# Patient Record
Sex: Female | Born: 1940 | Race: White | Hispanic: No | Smoking: Never smoker
Health system: Southern US, Community
[De-identification: ages and names within clinical notes are randomized; demographics above are authoritative.]

## PROBLEM LIST (undated history)

## (undated) DIAGNOSIS — N390 Urinary tract infection, site not specified: Secondary | ICD-10-CM

## (undated) DIAGNOSIS — I10 Essential (primary) hypertension: Secondary | ICD-10-CM

## (undated) DIAGNOSIS — E039 Hypothyroidism, unspecified: Secondary | ICD-10-CM

## (undated) DIAGNOSIS — N189 Chronic kidney disease, unspecified: Secondary | ICD-10-CM

## (undated) DIAGNOSIS — B029 Zoster without complications: Secondary | ICD-10-CM

## (undated) DIAGNOSIS — D509 Iron deficiency anemia, unspecified: Secondary | ICD-10-CM

## (undated) DIAGNOSIS — Z8701 Personal history of pneumonia (recurrent): Secondary | ICD-10-CM

## (undated) DIAGNOSIS — E119 Type 2 diabetes mellitus without complications: Secondary | ICD-10-CM

## (undated) DIAGNOSIS — M1990 Unspecified osteoarthritis, unspecified site: Secondary | ICD-10-CM

## (undated) DIAGNOSIS — E785 Hyperlipidemia, unspecified: Secondary | ICD-10-CM

## (undated) DIAGNOSIS — Z9289 Personal history of other medical treatment: Secondary | ICD-10-CM

## (undated) DIAGNOSIS — Z940 Kidney transplant status: Secondary | ICD-10-CM

## (undated) DIAGNOSIS — I4719 Other supraventricular tachycardia: Secondary | ICD-10-CM

## (undated) DIAGNOSIS — I6529 Occlusion and stenosis of unspecified carotid artery: Secondary | ICD-10-CM

## (undated) DIAGNOSIS — I471 Supraventricular tachycardia: Secondary | ICD-10-CM

## (undated) DIAGNOSIS — K219 Gastro-esophageal reflux disease without esophagitis: Secondary | ICD-10-CM

## (undated) HISTORY — DX: Chronic kidney disease, unspecified: N18.9

## (undated) HISTORY — PX: TUBAL LIGATION: SHX77

## (undated) HISTORY — DX: Supraventricular tachycardia: I47.1

## (undated) HISTORY — PX: KIDNEY SURGERY: SHX687

## (undated) HISTORY — DX: Other supraventricular tachycardia: I47.19

## (undated) HISTORY — DX: Zoster without complications: B02.9

## (undated) HISTORY — PX: TONSILLECTOMY: SUR1361

## (undated) HISTORY — PX: THYROIDECTOMY: SHX17

## (undated) HISTORY — PX: CHOLECYSTECTOMY OPEN: SUR202

## (undated) HISTORY — DX: Essential (primary) hypertension: I10

## (undated) HISTORY — DX: Iron deficiency anemia, unspecified: D50.9

## (undated) HISTORY — PX: BACK SURGERY: SHX140

## (undated) HISTORY — PX: LUMBAR DISC SURGERY: SHX700

## (undated) HISTORY — DX: Personal history of pneumonia (recurrent): Z87.01

## (undated) HISTORY — PX: APPENDECTOMY: SHX54

## (undated) HISTORY — PX: ABDOMINAL HYSTERECTOMY: SHX81

## (undated) HISTORY — DX: Kidney transplant status: Z94.0

---

## 1997-09-09 ENCOUNTER — Inpatient Hospital Stay (HOSPITAL_COMMUNITY): Admission: RE | Admit: 1997-09-09 | Discharge: 1997-09-11 | Payer: Self-pay | Admitting: Neurosurgery

## 1998-07-31 ENCOUNTER — Encounter: Payer: Self-pay | Admitting: Urology

## 1998-07-31 ENCOUNTER — Ambulatory Visit (HOSPITAL_COMMUNITY): Admission: RE | Admit: 1998-07-31 | Discharge: 1998-07-31 | Payer: Self-pay | Admitting: Urology

## 1998-08-03 ENCOUNTER — Ambulatory Visit (HOSPITAL_COMMUNITY): Admission: RE | Admit: 1998-08-03 | Discharge: 1998-08-03 | Payer: Self-pay | Admitting: Urology

## 1998-08-03 ENCOUNTER — Encounter: Payer: Self-pay | Admitting: Urology

## 1999-08-06 ENCOUNTER — Ambulatory Visit (HOSPITAL_COMMUNITY): Admission: RE | Admit: 1999-08-06 | Discharge: 1999-08-06 | Payer: Self-pay | Admitting: Gastroenterology

## 1999-08-06 ENCOUNTER — Encounter: Payer: Self-pay | Admitting: Gastroenterology

## 1999-12-07 ENCOUNTER — Other Ambulatory Visit: Admission: RE | Admit: 1999-12-07 | Discharge: 1999-12-07 | Payer: Self-pay | Admitting: Otolaryngology

## 2000-12-27 ENCOUNTER — Other Ambulatory Visit: Admission: RE | Admit: 2000-12-27 | Discharge: 2000-12-27 | Payer: Self-pay | Admitting: Obstetrics and Gynecology

## 2001-07-11 ENCOUNTER — Ambulatory Visit (HOSPITAL_COMMUNITY): Admission: RE | Admit: 2001-07-11 | Discharge: 2001-07-11 | Payer: Self-pay | Admitting: Pulmonary Disease

## 2001-08-30 ENCOUNTER — Encounter (HOSPITAL_COMMUNITY): Admission: RE | Admit: 2001-08-30 | Discharge: 2001-11-28 | Payer: Self-pay | Admitting: Nephrology

## 2001-10-31 ENCOUNTER — Ambulatory Visit (HOSPITAL_COMMUNITY): Admission: RE | Admit: 2001-10-31 | Discharge: 2001-10-31 | Payer: Self-pay | Admitting: Cardiology

## 2001-11-01 ENCOUNTER — Encounter: Payer: Self-pay | Admitting: Cardiology

## 2001-12-06 ENCOUNTER — Encounter (HOSPITAL_COMMUNITY): Admission: RE | Admit: 2001-12-06 | Discharge: 2002-03-06 | Payer: Self-pay | Admitting: Nephrology

## 2002-03-08 ENCOUNTER — Encounter (HOSPITAL_COMMUNITY): Admission: RE | Admit: 2002-03-08 | Discharge: 2002-06-06 | Payer: Self-pay | Admitting: Nephrology

## 2002-04-16 ENCOUNTER — Encounter: Payer: Self-pay | Admitting: Nephrology

## 2002-04-16 ENCOUNTER — Ambulatory Visit (HOSPITAL_COMMUNITY): Admission: RE | Admit: 2002-04-16 | Discharge: 2002-04-16 | Payer: Self-pay | Admitting: Nephrology

## 2002-05-11 ENCOUNTER — Emergency Department (HOSPITAL_COMMUNITY): Admission: EM | Admit: 2002-05-11 | Discharge: 2002-05-12 | Payer: Self-pay | Admitting: Emergency Medicine

## 2002-05-12 ENCOUNTER — Encounter: Payer: Self-pay | Admitting: *Deleted

## 2002-05-16 HISTORY — PX: KIDNEY TRANSPLANT: SHX239

## 2002-11-26 ENCOUNTER — Ambulatory Visit (HOSPITAL_COMMUNITY): Admission: RE | Admit: 2002-11-26 | Discharge: 2002-11-26 | Payer: Self-pay | Admitting: Nephrology

## 2002-11-26 ENCOUNTER — Encounter: Payer: Self-pay | Admitting: Nephrology

## 2003-03-04 ENCOUNTER — Encounter (HOSPITAL_COMMUNITY): Admission: RE | Admit: 2003-03-04 | Discharge: 2003-06-02 | Payer: Self-pay | Admitting: Nephrology

## 2003-05-20 ENCOUNTER — Ambulatory Visit (HOSPITAL_COMMUNITY): Admission: RE | Admit: 2003-05-20 | Discharge: 2003-05-20 | Payer: Self-pay | Admitting: Pulmonary Disease

## 2003-06-13 ENCOUNTER — Encounter (HOSPITAL_COMMUNITY): Admission: RE | Admit: 2003-06-13 | Discharge: 2003-09-11 | Payer: Self-pay | Admitting: Nephrology

## 2003-07-28 ENCOUNTER — Ambulatory Visit (HOSPITAL_COMMUNITY): Admission: RE | Admit: 2003-07-28 | Discharge: 2003-07-28 | Payer: Self-pay | Admitting: Pulmonary Disease

## 2003-08-27 ENCOUNTER — Ambulatory Visit (HOSPITAL_COMMUNITY): Admission: RE | Admit: 2003-08-27 | Discharge: 2003-08-27 | Payer: Self-pay | Admitting: Pulmonary Disease

## 2003-09-19 ENCOUNTER — Encounter (HOSPITAL_COMMUNITY): Admission: RE | Admit: 2003-09-19 | Discharge: 2003-12-18 | Payer: Self-pay | Admitting: Nephrology

## 2004-01-07 ENCOUNTER — Encounter (HOSPITAL_COMMUNITY): Admission: RE | Admit: 2004-01-07 | Discharge: 2004-04-06 | Payer: Self-pay | Admitting: Nephrology

## 2004-02-18 ENCOUNTER — Ambulatory Visit (HOSPITAL_COMMUNITY): Admission: RE | Admit: 2004-02-18 | Discharge: 2004-02-18 | Payer: Self-pay

## 2004-03-30 ENCOUNTER — Ambulatory Visit: Payer: Self-pay | Admitting: *Deleted

## 2004-04-13 ENCOUNTER — Encounter (HOSPITAL_COMMUNITY): Admission: RE | Admit: 2004-04-13 | Discharge: 2004-07-12 | Payer: Self-pay | Admitting: Nephrology

## 2004-04-21 ENCOUNTER — Ambulatory Visit: Payer: Self-pay | Admitting: Orthopedic Surgery

## 2004-04-28 ENCOUNTER — Encounter (HOSPITAL_COMMUNITY): Admission: RE | Admit: 2004-04-28 | Discharge: 2004-05-14 | Payer: Self-pay | Admitting: Orthopedic Surgery

## 2004-04-29 ENCOUNTER — Ambulatory Visit (HOSPITAL_COMMUNITY): Admission: RE | Admit: 2004-04-29 | Discharge: 2004-04-29 | Payer: Self-pay | Admitting: Pulmonary Disease

## 2004-07-15 ENCOUNTER — Encounter (HOSPITAL_COMMUNITY): Admission: RE | Admit: 2004-07-15 | Discharge: 2004-10-13 | Payer: Self-pay | Admitting: Nephrology

## 2004-11-03 ENCOUNTER — Encounter (HOSPITAL_COMMUNITY): Admission: RE | Admit: 2004-11-03 | Discharge: 2005-02-01 | Payer: Self-pay | Admitting: Nephrology

## 2004-11-09 ENCOUNTER — Ambulatory Visit (HOSPITAL_COMMUNITY): Admission: RE | Admit: 2004-11-09 | Discharge: 2004-11-09 | Payer: Self-pay | Admitting: Nephrology

## 2004-11-24 ENCOUNTER — Ambulatory Visit (HOSPITAL_COMMUNITY): Admission: RE | Admit: 2004-11-24 | Discharge: 2004-11-24 | Payer: Self-pay | Admitting: Pulmonary Disease

## 2005-02-15 ENCOUNTER — Encounter (HOSPITAL_COMMUNITY): Admission: RE | Admit: 2005-02-15 | Discharge: 2005-05-16 | Payer: Self-pay | Admitting: Nephrology

## 2005-07-05 ENCOUNTER — Encounter (HOSPITAL_COMMUNITY): Admission: RE | Admit: 2005-07-05 | Discharge: 2005-10-03 | Payer: Self-pay | Admitting: Nephrology

## 2005-07-08 ENCOUNTER — Emergency Department (HOSPITAL_COMMUNITY): Admission: EM | Admit: 2005-07-08 | Discharge: 2005-07-08 | Payer: Self-pay | Admitting: Emergency Medicine

## 2005-11-24 ENCOUNTER — Encounter (HOSPITAL_COMMUNITY): Admission: RE | Admit: 2005-11-24 | Discharge: 2005-12-24 | Payer: Self-pay | Admitting: Pulmonary Disease

## 2005-11-24 ENCOUNTER — Ambulatory Visit (HOSPITAL_COMMUNITY): Payer: Self-pay | Admitting: Pulmonary Disease

## 2005-12-06 ENCOUNTER — Inpatient Hospital Stay (HOSPITAL_COMMUNITY): Admission: EM | Admit: 2005-12-06 | Discharge: 2005-12-09 | Payer: Self-pay | Admitting: Emergency Medicine

## 2005-12-07 ENCOUNTER — Ambulatory Visit: Payer: Self-pay | Admitting: Gastroenterology

## 2006-07-20 ENCOUNTER — Ambulatory Visit (HOSPITAL_COMMUNITY): Admission: RE | Admit: 2006-07-20 | Discharge: 2006-07-20 | Payer: Self-pay | Admitting: Nephrology

## 2006-08-11 ENCOUNTER — Ambulatory Visit (HOSPITAL_COMMUNITY): Admission: RE | Admit: 2006-08-11 | Discharge: 2006-08-11 | Payer: Self-pay | Admitting: Nephrology

## 2006-09-25 ENCOUNTER — Ambulatory Visit (HOSPITAL_COMMUNITY): Payer: Self-pay | Admitting: Pulmonary Disease

## 2006-09-25 ENCOUNTER — Encounter (HOSPITAL_COMMUNITY): Admission: RE | Admit: 2006-09-25 | Discharge: 2006-10-25 | Payer: Self-pay | Admitting: Pulmonary Disease

## 2006-10-23 ENCOUNTER — Ambulatory Visit (HOSPITAL_COMMUNITY): Payer: Self-pay | Admitting: Nephrology

## 2006-10-23 ENCOUNTER — Encounter (HOSPITAL_COMMUNITY): Admission: RE | Admit: 2006-10-23 | Discharge: 2006-11-22 | Payer: Self-pay | Admitting: Nephrology

## 2006-10-27 ENCOUNTER — Encounter (HOSPITAL_COMMUNITY): Admission: RE | Admit: 2006-10-27 | Discharge: 2006-12-25 | Payer: Self-pay | Admitting: Nephrology

## 2006-11-23 ENCOUNTER — Ambulatory Visit (HOSPITAL_COMMUNITY): Payer: Self-pay | Admitting: Pulmonary Disease

## 2006-11-23 ENCOUNTER — Encounter (HOSPITAL_COMMUNITY): Admission: RE | Admit: 2006-11-23 | Discharge: 2006-12-23 | Payer: Self-pay | Admitting: Oncology

## 2006-12-21 ENCOUNTER — Ambulatory Visit (HOSPITAL_COMMUNITY): Payer: Self-pay | Admitting: Nephrology

## 2007-01-04 ENCOUNTER — Encounter (HOSPITAL_COMMUNITY): Admission: RE | Admit: 2007-01-04 | Discharge: 2007-02-03 | Payer: Self-pay | Admitting: Oncology

## 2007-02-16 ENCOUNTER — Encounter (HOSPITAL_COMMUNITY): Admission: RE | Admit: 2007-02-16 | Discharge: 2007-03-18 | Payer: Self-pay | Admitting: Oncology

## 2007-02-16 ENCOUNTER — Ambulatory Visit (HOSPITAL_COMMUNITY): Payer: Self-pay | Admitting: Nephrology

## 2007-09-07 ENCOUNTER — Ambulatory Visit (HOSPITAL_COMMUNITY): Admission: RE | Admit: 2007-09-07 | Discharge: 2007-09-07 | Payer: Self-pay | Admitting: Pulmonary Disease

## 2007-09-11 ENCOUNTER — Ambulatory Visit (HOSPITAL_COMMUNITY): Admission: RE | Admit: 2007-09-11 | Discharge: 2007-09-11 | Payer: Self-pay | Admitting: Pulmonary Disease

## 2007-09-11 ENCOUNTER — Encounter (INDEPENDENT_AMBULATORY_CARE_PROVIDER_SITE_OTHER): Payer: Self-pay | Admitting: *Deleted

## 2007-10-05 ENCOUNTER — Ambulatory Visit: Payer: Self-pay | Admitting: Internal Medicine

## 2007-10-24 ENCOUNTER — Encounter (INDEPENDENT_AMBULATORY_CARE_PROVIDER_SITE_OTHER): Payer: Self-pay | Admitting: *Deleted

## 2007-10-24 ENCOUNTER — Ambulatory Visit (HOSPITAL_COMMUNITY): Admission: RE | Admit: 2007-10-24 | Discharge: 2007-10-24 | Payer: Self-pay | Admitting: Internal Medicine

## 2007-10-24 ENCOUNTER — Ambulatory Visit: Payer: Self-pay | Admitting: Internal Medicine

## 2007-11-05 ENCOUNTER — Encounter (HOSPITAL_COMMUNITY): Admission: RE | Admit: 2007-11-05 | Discharge: 2007-12-05 | Payer: Self-pay | Admitting: Internal Medicine

## 2007-11-05 ENCOUNTER — Encounter (INDEPENDENT_AMBULATORY_CARE_PROVIDER_SITE_OTHER): Payer: Self-pay | Admitting: *Deleted

## 2008-03-26 ENCOUNTER — Emergency Department (HOSPITAL_COMMUNITY): Admission: EM | Admit: 2008-03-26 | Discharge: 2008-03-26 | Payer: Self-pay | Admitting: Emergency Medicine

## 2008-09-26 ENCOUNTER — Ambulatory Visit: Payer: Self-pay | Admitting: Cardiology

## 2008-10-02 ENCOUNTER — Encounter (HOSPITAL_COMMUNITY): Admission: RE | Admit: 2008-10-02 | Discharge: 2008-11-01 | Payer: Self-pay | Admitting: Cardiology

## 2008-10-02 ENCOUNTER — Ambulatory Visit: Payer: Self-pay | Admitting: Cardiology

## 2008-10-28 DIAGNOSIS — E785 Hyperlipidemia, unspecified: Secondary | ICD-10-CM

## 2008-10-29 ENCOUNTER — Ambulatory Visit: Payer: Self-pay | Admitting: Cardiology

## 2008-11-26 ENCOUNTER — Ambulatory Visit (HOSPITAL_COMMUNITY): Admission: RE | Admit: 2008-11-26 | Discharge: 2008-11-26 | Payer: Self-pay | Admitting: Pulmonary Disease

## 2009-03-20 ENCOUNTER — Ambulatory Visit (HOSPITAL_COMMUNITY): Admission: RE | Admit: 2009-03-20 | Discharge: 2009-03-20 | Payer: Self-pay | Admitting: Pulmonary Disease

## 2009-04-10 ENCOUNTER — Ambulatory Visit (HOSPITAL_COMMUNITY): Admission: RE | Admit: 2009-04-10 | Discharge: 2009-04-10 | Payer: Self-pay | Admitting: Pulmonary Disease

## 2009-05-03 ENCOUNTER — Inpatient Hospital Stay (HOSPITAL_COMMUNITY): Admission: EM | Admit: 2009-05-03 | Discharge: 2009-05-07 | Payer: Self-pay | Admitting: Emergency Medicine

## 2009-05-03 ENCOUNTER — Encounter (INDEPENDENT_AMBULATORY_CARE_PROVIDER_SITE_OTHER): Payer: Self-pay | Admitting: *Deleted

## 2009-05-03 ENCOUNTER — Ambulatory Visit: Payer: Self-pay | Admitting: Cardiology

## 2009-05-03 LAB — CONVERTED CEMR LAB
BUN: 37 mg/dL
CO2: 18 meq/L
CO2: 18 meq/L
Calcium: 9 mg/dL
Chloride: 111 meq/L
Chloride: 111 meq/L
Creatinine, Ser: 1.89 mg/dL
Creatinine, Ser: 1.89 mg/dL
GFR calc non Af Amer: 27 mL/min
Glomerular Filtration Rate, Af Am: 32 mL/min/{1.73_m2}
Glucose, Bld: 187 mg/dL
Glucose, Bld: 187 mg/dL
Potassium: 4.4 meq/L
Sodium: 139 meq/L

## 2009-05-06 ENCOUNTER — Encounter (INDEPENDENT_AMBULATORY_CARE_PROVIDER_SITE_OTHER): Payer: Self-pay | Admitting: *Deleted

## 2009-05-06 ENCOUNTER — Encounter: Payer: Self-pay | Admitting: Cardiology

## 2009-05-06 DIAGNOSIS — D649 Anemia, unspecified: Secondary | ICD-10-CM

## 2009-05-06 DIAGNOSIS — I10 Essential (primary) hypertension: Secondary | ICD-10-CM | POA: Insufficient documentation

## 2009-05-22 ENCOUNTER — Encounter (INDEPENDENT_AMBULATORY_CARE_PROVIDER_SITE_OTHER): Payer: Self-pay | Admitting: *Deleted

## 2009-05-25 ENCOUNTER — Ambulatory Visit: Payer: Self-pay | Admitting: Cardiology

## 2009-05-25 DIAGNOSIS — I4891 Unspecified atrial fibrillation: Secondary | ICD-10-CM

## 2009-08-12 ENCOUNTER — Encounter: Admission: RE | Admit: 2009-08-12 | Discharge: 2009-08-12 | Payer: Self-pay | Admitting: Nephrology

## 2009-11-16 ENCOUNTER — Emergency Department (HOSPITAL_COMMUNITY): Admission: EM | Admit: 2009-11-16 | Discharge: 2009-11-16 | Payer: Self-pay | Admitting: Emergency Medicine

## 2009-11-19 ENCOUNTER — Encounter (INDEPENDENT_AMBULATORY_CARE_PROVIDER_SITE_OTHER): Payer: Self-pay | Admitting: *Deleted

## 2009-11-19 LAB — CONVERTED CEMR LAB
Alkaline Phosphatase: 72 units/L
Basophils Absolute: 0 10*3/uL
CO2: 20 meq/L
Creatinine, Ser: 1.81 mg/dL
Eosinophils Absolute: 0.2 10*3/uL
Eosinophils Relative: 4 %
Glucose, Bld: 150 mg/dL
Iron: 67 ug/dL
Lymphocytes Relative: 21 %
MCHC: 31.3 g/dL
MCV: 89.4 fL
RBC: 3.76 M/uL
RDW: 15 %

## 2009-12-22 ENCOUNTER — Encounter (INDEPENDENT_AMBULATORY_CARE_PROVIDER_SITE_OTHER): Payer: Self-pay | Admitting: *Deleted

## 2010-02-12 ENCOUNTER — Encounter: Admission: RE | Admit: 2010-02-12 | Discharge: 2010-02-12 | Payer: Self-pay | Admitting: Nephrology

## 2010-06-15 NOTE — Assessment & Plan Note (Signed)
Summary: post hosp [name] per pt phone call/tg   History of Present Illness: Debar comes in today for followup. She has some postoperative atrial fibrillation on recent admission with a urinary tract infection.  Since discharge he had no recurrent symptoms. It was felt she did not need Coumadin. Her only predisposing factor with hypertension.  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 colonoscopy Tonsillectomy Tubal Ligation  Family History: Last updated: 2008-11-17 Father:deceased age 13 due to secondary coronary disease mi Mother:living age 55 has diabetes mellitus  Social History: Last updated: 2008-11-17 Retired from [employer]er, [employer] Married  Tobacco Use - No.  Alcohol Use - no Regular Exercise - no Drug Use - no patient has 1 sister alive and well 2 deceased brothers 1 due to alcoholic cirrhosis  Risk Factors: Exercise: no (November 17, 2008)  Risk Factors: Smoking Status: never (17-Nov-2008)  Review of Systems       negative other than history of present illness   Problems:  Medical Problems Added: 1)  Dx of Atrial Fibrillation  (ICD-427.31)  Impression & Recommendations:  Problem # 1:  HYPERTENSION (ICD-401.9) Assessment Unchanged  Her updated medication list for this problem includes:    Metoprolol Tartrate 50 Mg Tabs (Metoprolol tartrate) .Marland Kitchen... Take 1 and 1/2 tabs two times a day    Clonidine Hcl 0.2 Mg Tabs (Clonidine hcl) .Marland Kitchen... Take 1/2 tablet once daily    Micardis 80 Mg Tabs (Telmisartan) .Marland Kitchen... Take 1 tab daily    Aspir-low 81 Mg Tbec (Aspirin) .Marland Kitchen... Take 1 tab daily  Problem # 2:  ATRIAL FIBRILLATION  (ICD-427.31) Assessment: Improved  Her updated medication list for this problem includes:    Metoprolol Tartrate 50 Mg Tabs (Metoprolol tartrate) .Marland Kitchen... Take 1 and 1/2 tabs two times a day    Aspir-low 81 Mg Tbec (Aspirin) .Marland Kitchen... Take 1 tab daily

## 2010-06-15 NOTE — Miscellaneous (Signed)
Summary: labs hosp 05/03/2009  Clinical Lists Changes  Observations: Added new observation of GFR AA: 32 mL/min/1.56m2 (05/03/2009 12:11) Added new observation of GFR: 27 mL/min (05/03/2009 12:11) Added new observation of CREATININE: 1.89 mg/dL (16/02/9603 54:09) Added new observation of BUN: 37 mg/dL (81/19/1478 29:56) Added new observation of BG RANDOM: 187 mg/dL (21/30/8657 84:69) Added new observation of CO2 PLSM/SER: 18 meq/L (05/03/2009 12:11) Added new observation of CL SERUM: 111 meq/L (05/03/2009 12:11) Added new observation of K SERUM: 4.4 meq/L (05/03/2009 12:11) Added new observation of NA: 139 meq/L (05/03/2009 12:11)

## 2010-06-15 NOTE — Assessment & Plan Note (Signed)
Summary: post hosp [name] per pt phone call/tg   Visit Type:  Follow-up Primary Provider:  Dr.Edward Juanetta Gosling   History of Present Illness: [name] returns today for followup of her paroxysmal atrial fibrillation.  This happened while she was in the hospital with a urinary tract infection. Risk factors are hypertension.  She has not had any further events that she is aware of. It was felt she did not need Coumadin at this time.  She denies any palpitations, chest discomfort, dyspnea on exertion, PND or peripheral edema.  Current Medications (verified): 1)  Cellcept 500 Mg Tabs (Mycophenolate Mofetil) .... Take 1 Tab Two Times A Day 2)  Prograf 5 Mg Caps (Tacrolimus) .... Take 2 Every Other Day 3)  Diflucan 200 Mg Tabs (Fluconazole) .... Take 1/2 Tab Daily 4)  Metoprolol Tartrate 50 Mg Tabs (Metoprolol Tartrate) .... Take 1 and 1/2 Tabs Two Times A Day 5)  Clonidine Hcl 0.2 Mg Tabs (Clonidine Hcl) .... Take 1/2 Tablet Once Daily 6)  Micardis 80 Mg Tabs (Telmisartan) .... Take 1 Tab Daily 7)  Zetia 10 Mg Tabs (Ezetimibe) .... Take 1 Tab Daily 8)  Aspir-Low 81 Mg Tbec (Aspirin) .... Take 1 Tab Daily 9)  Clonazepam 0.5 Mg Tabs (Clonazepam) .... Take 1 Tab At Bedtime 10)  Nexium 40 Mg Cpdr (Esomeprazole Magnesium) .... Take 1 Tab Two Times A Day 11)  Dilaudid 2 Mg/ml Soln (Hydromorphone Hcl) .... Take 1 Tab At Bedtime  Allergies (verified): No Known Drug Allergies  Review of Systems       negative other than history of present illness  Vital Signs:  Patient profile:   70 year old female Height:      63 inches Weight:      131 pounds BMI:     23.29 Pulse rate:   61 / minute BP sitting:   114 / 66  (right arm)  Vitals Entered By: Deanna Saa, CNA (May 25, 2009 12:56 PM)  Physical Exam  General:  Well developed, well nourished, in no acute distress. Head:  normocephalic and atraumatic Eyes:  PERRLA/EOM intact; conjunctiva and lids normal. Mouth:  Teeth, gums  and palate normal. Oral mucosa normal. Neck:  Neck supple, no JVD. No masses, thyromegaly or abnormal cervical nodes. Chest Joua Bake:  no deformities or breast masses noted Lungs:  Clear bilaterally to auscultation and percussion. Heart:  Non-displaced PMI, chest non-tender; regular rate and rhythm, S1, S2 without murmurs, rubs or gallops. Carotid upstroke normal, no bruit. Normal abdominal aortic size, no bruits. Femorals normal pulses, no bruits. Pedals normal pulses. No edema, no varicosities. Msk:  Back normal, normal gait. Muscle strength and tone normal. Pulses:  pulses normal in all 4 extremities Extremities:  No clubbing or cyanosis. Neurologic:  Alert and oriented x 3. Skin:  Intact without lesions or rashes. Psych:  Normal affect.   Problems:  Medical Problems Added: 1)  Dx of Atrial Fibrillation  (ICD-427.31)  EKG  Procedure date:  05/25/2009  Findings:      sinus bradycardia, low voltage, left atrial enlargement  Impression & Recommendations:  Problem # 1:  ATRIAL FIBRILLATION (ICD-427.31) Assessment Improved  Her updated medication list for this problem includes:    Metoprolol Tartrate 50 Mg Tabs (Metoprolol tartrate) .Marland Kitchen... Take 1 and 1/2 tabs two times a day    Aspir-low 81 Mg Tbec (Aspirin) .Marland Kitchen... Take 1 tab daily  Problem # 2:  HYPERTENSION (ICD-401.9) Assessment: Unchanged  Her updated medication list for this problem includes:  Metoprolol Tartrate 50 Mg Tabs (Metoprolol tartrate) .Marland Kitchen... Take 1 and 1/2 tabs two times a day    Clonidine Hcl 0.2 Mg Tabs (Clonidine hcl) .Marland Kitchen... Take 1/2 tablet once daily    Micardis 80 Mg Tabs (Telmisartan) .Marland Kitchen... Take 1 tab daily    Aspir-low 81 Mg Tbec (Aspirin) .Marland Kitchen... Take 1 tab daily  Patient Instructions: 1)  Your physician recommends that you schedule a follow-up appointment in: 6 months 2)  Your physician has recommended you make the following change in your medication: DECREASE CLONIDINE TO 1/2 TABLET BY MOUTH DAILY

## 2010-06-15 NOTE — Miscellaneous (Signed)
Summary: LABS IRON,IBC,CBCD,CMP 11/19/2009  Clinical Lists Changes  Observations: Added new observation of MAGNESIUM: 1.8 mg/dL (16/02/9603 54:09) Added new observation of CALCIUM: 8.9 mg/dL (81/19/1478 29:56) Added new observation of ALBUMIN: 4.3 g/dL (21/30/8657 84:69) Added new observation of PROTEIN, TOT: 6.6 g/dL (62/95/2841 32:44) Added new observation of SGPT (ALT): 13 units/L (11/19/2009 16:00) Added new observation of SGOT (AST): 12 units/L (11/19/2009 16:00) Added new observation of ALK PHOS: 72 units/L (11/19/2009 16:00) Added new observation of CREATININE: 1.81 mg/dL (05/18/7251 66:44) Added new observation of BUN: 30 mg/dL (03/47/4259 56:38) Added new observation of BG RANDOM: 150 mg/dL (75/64/3329 51:88) Added new observation of CO2 PLSM/SER: 20 meq/L (11/19/2009 16:00) Added new observation of CL SERUM: 108 meq/L (11/19/2009 16:00) Added new observation of K SERUM: 4.6 meq/L (11/19/2009 16:00) Added new observation of NA: 142 meq/L (11/19/2009 16:00) Added new observation of IRON SATUR %: 30 % (11/19/2009 16:00) Added new observation of TIBC: 160 mcg/dL (41/66/0630 16:01) Added new observation of UIBC: 227 mcg/dL (09/32/3557 32:20) Added new observation of IRON: 67 mcg/dL (25/42/7062 37:62) Added new observation of ABSOLUTE BAS: 0.0 K/uL (11/19/2009 16:00) Added new observation of BASOPHIL %: 1 % (11/19/2009 16:00) Added new observation of EOS ABSLT: 0.2 K/uL (11/19/2009 16:00) Added new observation of % EOS AUTO: 4 % (11/19/2009 16:00) Added new observation of ABSOLUTE MON: 0.5 K/uL (11/19/2009 16:00) Added new observation of MONOCYTE %: 10 % (11/19/2009 16:00) Added new observation of ABS LYMPHOCY: 1.0 K/uL (11/19/2009 16:00) Added new observation of LYMPHS %: 21 % (11/19/2009 16:00) Added new observation of PLATELETK/UL: 158 K/uL (11/19/2009 16:00) Added new observation of RDW: 15.0 % (11/19/2009 16:00) Added new observation of MCHC RBC: 31.3 g/dL (83/15/1761  60:73) Added new observation of MCV: 89.4 fL (11/19/2009 16:00) Added new observation of HCT: 33.6 % (11/19/2009 16:00) Added new observation of HGB: 10.5 g/dL (71/10/2692 85:46) Added new observation of RBC M/UL: 3.76 M/uL (11/19/2009 16:00) Added new observation of WBC COUNT: 4.8 10*3/microliter (11/19/2009 16:00)

## 2010-06-15 NOTE — Miscellaneous (Signed)
Summary: labs bmet,tsh 05/03/2009  Clinical Lists Changes  Observations: Added new observation of CALCIUM: 9.0 mg/dL (40/34/7425 95:63) Added new observation of GFR AA: 32 mL/min/1.2m2 (05/03/2009 12:07) Added new observation of GFR: 27 mL/min (05/03/2009 12:07) Added new observation of CREATININE: 1.89 mg/dL (87/56/4332 95:18) Added new observation of BUN: 37 mg/dL (84/16/6063 01:60) Added new observation of BG RANDOM: 187 mg/dL (10/93/2355 73:22) Added new observation of CO2 PLSM/SER: 18 meq/L (05/03/2009 12:07) Added new observation of CL SERUM: 111 meq/L (05/03/2009 12:07) Added new observation of K SERUM: 4.4 meq/L (05/03/2009 12:07) Added new observation of NA: 139 meq/L (05/03/2009 12:07)

## 2010-07-02 ENCOUNTER — Other Ambulatory Visit (HOSPITAL_COMMUNITY): Payer: Self-pay | Admitting: *Deleted

## 2010-07-02 ENCOUNTER — Ambulatory Visit (HOSPITAL_COMMUNITY)
Admission: RE | Admit: 2010-07-02 | Discharge: 2010-07-02 | Disposition: A | Discharge status: Home / Self Care | Payer: Medicare Other | Source: Ambulatory Visit | Attending: Diagnostic Radiology | Admitting: Diagnostic Radiology

## 2010-07-02 DIAGNOSIS — J189 Pneumonia, unspecified organism: Secondary | ICD-10-CM

## 2010-07-02 DIAGNOSIS — J988 Other specified respiratory disorders: Secondary | ICD-10-CM | POA: Insufficient documentation

## 2010-07-02 DIAGNOSIS — J984 Other disorders of lung: Secondary | ICD-10-CM | POA: Insufficient documentation

## 2010-07-02 DIAGNOSIS — Z940 Kidney transplant status: Secondary | ICD-10-CM | POA: Insufficient documentation

## 2010-07-08 ENCOUNTER — Other Ambulatory Visit (HOSPITAL_COMMUNITY): Payer: Self-pay | Admitting: Nephrology

## 2010-07-08 DIAGNOSIS — N390 Urinary tract infection, site not specified: Secondary | ICD-10-CM

## 2010-07-08 DIAGNOSIS — T8610 Unspecified complication of kidney transplant: Secondary | ICD-10-CM

## 2010-07-12 ENCOUNTER — Ambulatory Visit (HOSPITAL_COMMUNITY): Payer: Medicare Other

## 2010-07-20 ENCOUNTER — Encounter (HOSPITAL_COMMUNITY): Payer: Self-pay

## 2010-07-20 ENCOUNTER — Ambulatory Visit (HOSPITAL_COMMUNITY)
Admission: RE | Admit: 2010-07-20 | Discharge: 2010-07-20 | Disposition: A | Discharge status: Home / Self Care | Payer: Medicare Other | Source: Ambulatory Visit | Attending: Nephrology | Admitting: Nephrology

## 2010-07-20 DIAGNOSIS — T8610 Unspecified complication of kidney transplant: Secondary | ICD-10-CM

## 2010-07-20 DIAGNOSIS — N390 Urinary tract infection, site not specified: Secondary | ICD-10-CM | POA: Insufficient documentation

## 2010-07-20 DIAGNOSIS — Z9079 Acquired absence of other genital organ(s): Secondary | ICD-10-CM | POA: Insufficient documentation

## 2010-07-20 DIAGNOSIS — Q618 Other cystic kidney diseases: Secondary | ICD-10-CM | POA: Insufficient documentation

## 2010-07-20 DIAGNOSIS — Z940 Kidney transplant status: Secondary | ICD-10-CM | POA: Insufficient documentation

## 2010-07-20 DIAGNOSIS — K7689 Other specified diseases of liver: Secondary | ICD-10-CM | POA: Insufficient documentation

## 2010-07-20 DIAGNOSIS — K5730 Diverticulosis of large intestine without perforation or abscess without bleeding: Secondary | ICD-10-CM | POA: Insufficient documentation

## 2010-08-01 LAB — URINALYSIS, ROUTINE W REFLEX MICROSCOPIC
Glucose, UA: NEGATIVE mg/dL
Ketones, ur: NEGATIVE mg/dL
Protein, ur: NEGATIVE mg/dL

## 2010-08-01 LAB — URINE CULTURE: Colony Count: >=100000

## 2010-08-01 LAB — URINE MICROSCOPIC-ADD ON

## 2010-08-16 LAB — GLUCOSE, CAPILLARY
Glucose-Capillary: 138 mg/dL — ABNORMAL HIGH (ref 70–99)
Glucose-Capillary: 159 mg/dL — ABNORMAL HIGH (ref 70–99)
Glucose-Capillary: 167 mg/dL — ABNORMAL HIGH (ref 70–99)
Glucose-Capillary: 168 mg/dL — ABNORMAL HIGH (ref 70–99)
Glucose-Capillary: 185 mg/dL — ABNORMAL HIGH (ref 70–99)
Glucose-Capillary: 210 mg/dL — ABNORMAL HIGH (ref 70–99)
Glucose-Capillary: 220 mg/dL — ABNORMAL HIGH (ref 70–99)
Glucose-Capillary: 229 mg/dL — ABNORMAL HIGH (ref 70–99)

## 2010-08-16 LAB — DIFFERENTIAL
Basophils Absolute: 0.1 K/uL (ref 0.0–0.1)
Basophils Relative: 1 % (ref 0–1)
Eosinophils Absolute: 0.2 K/uL (ref 0.0–0.7)
Eosinophils Relative: 2 % (ref 0–5)
Lymphocytes Relative: 6 % — ABNORMAL LOW (ref 12–46)
Lymphs Abs: 0.4 K/uL — ABNORMAL LOW (ref 0.7–4.0)
Monocytes Absolute: 0.4 K/uL (ref 0.1–1.0)
Monocytes Relative: 5 % (ref 3–12)
Neutro Abs: 6.9 K/uL (ref 1.7–7.7)
Neutrophils Relative %: 87 % — ABNORMAL HIGH (ref 43–77)

## 2010-08-16 LAB — CULTURE, BLOOD (ROUTINE X 2)
Culture: NO GROWTH
Report Status: 12242010

## 2010-08-16 LAB — BASIC METABOLIC PANEL
BUN: 37 mg/dL — ABNORMAL HIGH (ref 6–23)
CO2: 18 mEq/L — ABNORMAL LOW (ref 19–32)
Calcium: 9 mg/dL (ref 8.4–10.5)
Chloride: 111 mEq/L (ref 96–112)
Creatinine, Ser: 1.89 mg/dL — ABNORMAL HIGH (ref 0.4–1.2)
Glucose, Bld: 187 mg/dL — ABNORMAL HIGH (ref 70–99)

## 2010-08-16 LAB — URINALYSIS, ROUTINE W REFLEX MICROSCOPIC
Bilirubin Urine: NEGATIVE
Glucose, UA: NEGATIVE mg/dL
Ketones, ur: NEGATIVE mg/dL
Nitrite: POSITIVE — AB
Protein, ur: 30 mg/dL — AB
Specific Gravity, Urine: 1.015 (ref 1.005–1.030)
Urobilinogen, UA: 0.2 mg/dL (ref 0.0–1.0)
pH: 5 (ref 5.0–8.0)

## 2010-08-16 LAB — CBC
MCHC: 34.5 g/dL (ref 30.0–36.0)
MCV: 88.5 fL (ref 78.0–100.0)
RDW: 14.6 % (ref 11.5–15.5)

## 2010-08-16 LAB — URINE MICROSCOPIC-ADD ON

## 2010-08-16 LAB — URINE CULTURE: Colony Count: >=100000

## 2010-08-16 LAB — CARDIAC PANEL(CRET KIN+CKTOT+MB+TROPI)
CK, MB: 0.7 ng/mL (ref 0.3–4.0)
Relative Index: INVALID (ref 0.0–2.5)
Relative Index: INVALID (ref 0.0–2.5)
Total CK: 38 U/L (ref 7–177)
Troponin I: 0.02 ng/mL (ref 0.00–0.06)

## 2010-09-28 NOTE — H&P (Signed)
NAME:  [name], [name]               ACCOUNT NO.:  [id number]   MEDICAL RECORD NO.:  [id number]          PATIENT TYPE:  AMB   LOCATION:  DAY                           FACILITY:  [facility]   PHYSICIAN:  R. Roetta Sessions, M.D. DATE OF BIRTH:  August 07, 1940   DATE OF ADMISSION:  DATE OF DISCHARGE:  LH                              HISTORY & PHYSICAL   CHIEF COMPLAINT:  Early satiety, abdominal bloating, chronic nausea and  refractory GERD.   HISTORY OF PRESENT ILLNESS:  [name] is a 70 year old Caucasian  female.  She has a history of chronic GERD.  Over the last 2 months, she  has had chronic nausea.  She tells me it is worse within an hour or two  after eating.  She does eat small meals and does have early satiety.  She complains of a significant amount of abdominal bloating after eating  as well.  Her weight is up 19 pounds in the last 6 months.  She did have  some epigastric and left upper quadrant abdominal pain after eating as  well as heartburn and indigestion, but recently her Nexium was changed  from 40 mg b.i.d. to Capadex 60 mg daily.  She says this has helped with  the epigastric pain but she continues to have the bloating and nausea.  She is having soft brown bowel movements once or twice per day.  Denies  any rectal bleeding or melena.   PAST MEDICAL AND SURGICAL HISTORY:  1. 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  She has had multiple stenting procedures      over the course of 30 years to help preserve her kidney function.      She is followed by Dr. Aundria Rud at the Transplant Clinic at [facility].  2. 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  Partial      thyroidectomy for goiter on thyroid replacement, hypertension,      hyperlipidemia, iron deficiency anemia, history of previous      Aranesp, which was recently discontinued.  3. Status post cholecystectomy for acalculous  cholecystitis in 1986.      Partial hysterectomy with unilateral oophorectomy, tonsillectomy,      back surgery.  She had a colonoscopy 5 years ago by [facility] in      [place] which reportedly was normal (we will request records).   CURRENT MEDICATIONS:  1. CellCept 500 mg b.i.d.  2. Prograf 0.5 mg daily.  3. Diflucan 100 mg daily.  4. Metoprolol 75 mg b.i.d.  5. Clonidine 0.1 one to two mg daily.  6. Micardis 80 mg daily.  7. Capadex 60 mg daily.  8. Zetia 10 mg daily.  9. Aspirin 81 mg daily.  10.Levoxyl 75 mcg daily.   ALLERGIES:  MORPHINE, CODEINE, IV MAGNESIUM and IV DYE.   She did have an abdominal ultrasound on September 11, 2007.  It showed  atrophic native kidneys with stable minimally complicated cyst to the  right kidney which was 3.2 cm  she had unremarkable transplant kidney in  the right iliac fossa.  She had a CBD of 7 mm, status post  cholecystectomy.  Echogenic fatty liver, otherwise normal exam.  She had  a chest x-ray which showed minimal bronchitic changes, stable nodular  foci bilateral upper lobes.   FAMILY HISTORY:  There is no known family history of carcinoma or  chronic GI problems.  Mother is age 75 has diabetes mellitus.  Father  deceased at 6 secondary coronary disease and MI.  She has 1 sister  alive and has lost 2 brothers, one due to alcoholic cirrhosis.   SOCIAL HISTORY:  [name] is married.  She has 5 healthy children.  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  She denies any tobacco, alcohol or drug  use.   REVIEW OF SYSTEMS:  See HPI.  GU: She denies any dysuria, hematuria,  increased urinary frequency.  Otherwise negative review of systems.   PHYSICAL EXAM:  VITAL SIGNS: Weight 129 pounds, height 63 inches,  temperature 98.0, blood pressure 120/80, pulse 76.  GENERAL:  [name] is a well-developed, well-nourished Caucasian  female in no acute distress.  who is awake and alert, oriented, pleasant, and cooperative, in no acute  distress.   HEENT:  Pupils equal, round, and reactive to light.  Sclerae clear,  nonicteric.  Conjunctivae pink.  Oropharynx pink and moist without  lesions.  NECK:  Supple without evidence of mass or thyromegaly.  CHEST:  Heart regular rate and rhythm.  Normal S1, S2 without murmurs,  clicks, rubs, or gallops.  LUNGS:  Clear to auscultation bilaterally.  ABDOMEN:  Positive bowel sounds x4.  No bruits auscultated.  Soft.  Nontender, nondistended without palpable mass or hepatosplenomegaly.  No  rebound tenderness or guarding.  EXTREMITIES:  Without clubbing or edema bilaterally.  SKIN:  Warm and dry without any rash or jaundice.   IMPRESSION:  [name] is a 70 year old female with a 82-month history  of chronic nausea along with refractory heartburn and indigestion,  abdominal bloating, and postprandial epigastric and left upper quadrant  pain.  Some of her symptoms have responded to a change in PPI.  However,  her postprandial nausea and bloating persist.  She is status post  cholecystectomy as well as renal transplant.  She is noted to have fatty  liver on ultrasound as well.  I did feel we need to rule out peptic  ulcer disease as well as gastritis or poorly controlled gastroesophageal  reflux disease as culprit of her symptoms.  Interestingly, she has  continued to gain weight, but she believes that her thyroid status has  been checked recently at [facility].  Will request all records.  She does  not appear to have pancreatitis or small-bowel obstruction.   PLAN:  1. Will request recent laboratory studies from [facility], Dr. Aundria Rud.  2. EGD with Dr. Jena Gauss in the near future.  Discuss procedure as risks      and benefits, which include but not limited to bleeding, infection,      perforation, drug reaction, she agrees plans obtained.  3. Continue Capadex 60 mg daily.  4. Further recommendations pending EGD.  Thank you Dr. Juanetta Gosling for      letting us  participate in the care of [name].      Lorenza Burton, N.P.      Jonathon Bellows, M.D.  Electronically Signed    KJ/MEDQ  D:  10/05/2007  T:  10/05/2007  Job:  [id number]   cc:   Ramon Dredge L. Juanetta Gosling, M.D.  Fax: [phone]   Dyke Maes, M.D.  Fax: [phone]

## 2010-09-28 NOTE — Op Note (Signed)
NAME:  [name], MELECIO               ACCOUNT NO.:  [id number]   MEDICAL RECORD NO.:  [id number]          PATIENT TYPE:  AMB   LOCATION:  DAY                           FACILITY:  [facility]   PHYSICIAN:  R. Roetta Sessions, M.D. DATE OF BIRTH:  Oct 02, 1940   DATE OF PROCEDURE:  DATE OF DISCHARGE:                               OPERATIVE REPORT   INDICATIONS FOR PROCEDURE:  A 23-month history of chronic nausea,  heartburn, indigestion, abdominal bloating, and postprandial epigastric  left upper quadrant abdominal pain.  She has derived significant  improvement with a course of Kapidex 60 mg orally daily for the past 1  month, but continues to have regurgitation, nausea, and early satiety.  She is not having any dysphagia or odynophagia.  EGD is now being done.  The risks, benefits, alternatives, and limitations have been reviewed.  Questions answered.  Please see documentation in the medical record.   PROCEDURE NOTE:  O2 saturation, blood pressure, pulse rate, and  respirations were monitored throughout the entire procedure.   CONSCIOUS SEDATION:  Versed 4 mg IV and Demerol 75 mg IV in divided  doses.   ANESTHESIA:  Cetacaine spray for topical pharyngeal anesthesia.   INSTRUMENT:  Pentax video endoscopy system.   FINDINGS:  Examination of the tubular esophagus revealed a couple of  tiny distal esophageal erosions, otherwise esophageal mucosa appeared  normal, and the tubular esophagus was patent.  EG junction easily  traversed.  Stomach:  The gastric cavity had a large amount of retained  food, which precluded a complete examination of the stomach.  I was able  to see the antrum well.  This portion of the stomach appeared normal as  did the pylorus, which was patent and easily traversed.  Examination of  the bulb, second portion revealed no abnormalities.  The retroflexion of  the proximal stomach, esophagogastric junction, there was a small hiatal  hernia, but again a good portion of the  stomach was obscured by food  debris.  The patient tolerated the procedure well.   IMPRESSION:  1. A couple of tiny distal esophageal erosions consistent with mild      erosive reflux esophagitis.  2. A large amount of retained food debris in the stomach precluded      complete examination of the gastric mucosa, normal-appearing      antrum, patent pylorus, normal D1 and D2.   I suspect, the patient has gastroparesis.  (It is notable that her last  intake of solid food occurred 14 hours prior to this procedure).   RECOMMENDATIONS:  Continue Kapidex 60 mg orally daily.  For the time  being, she is to divide up her meals into 4 or 5 smaller meals daily  rather than 3 main meals.  We will proceed with a solid-phase gastric-  emptying study to quantify the degree of gastroparesis.  Further  recommendations to follow in the very near future.      Jonathon Bellows, M.D.  Electronically Signed     RMR/MEDQ  D:  10/24/2007  T:  10/24/2007  Job:  [id number]   cc:  Dyke Maes, M.D.  Fax: [phone]   Oneal Deputy. Juanetta Gosling, M.D.  Fax: [phone]   Dr. Aundria Rud

## 2010-09-28 NOTE — Assessment & Plan Note (Signed)
[facility]                       [place] [facility] OFFICE NOTE   ELLIANNE, [name] Maddox                      MRN:          [id number]  DATE:09/26/2008                            DOB:          03/31/1941    CHIEF COMPLAINT:  Shortness of breath.   HISTORY OF PRESENT ILLNESS:  [name] Maddox is a delightful 71 year old  white female, who has been having shortness of breath and some  substernal atypical chest pain over the last several months.   It occurs sometimes when she is really upset emotionally with her  husband.  Her husband is a patient of mine, who has had several strokes  and has become very difficult to manage at home.  He takes out a lot on  her.   She also has some shortness breath with exertion.  She is unable to do  much for herself including walk or exercise.   She has no conventional risk factors except for age and hypertension.  She does not have hyperlipidemia and type 2 diabetes and does not smoke.  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  She had a transplant 2004, and is doing very well with that.   PAST MEDICAL HISTORY:  She is intolerant of MORPHINE and CODEINE.  She  has intolerance to IV DYE.   She has had previous tonsillectomy, tubal ligation, hysterectomy,  cholecystectomy, lower back surgery, and partial thyroidectomy.  Apparently, she has had some sort of stent to her kidney in 1973, but I  suspect this was not vascular nor that she had a urinary track drainage  abnormality.   SOCIAL HISTORY:  She is married.  She has 5 children.  She was born and  raised in [place].  She does not smoke or drink.  She does not use  any illicit drugs.  She is retired.   FAMILY HISTORY:  There is a history of heart disease in her father, who  died in his 51s.   REVIEW OF SYSTEMS:  She wears glasses.  She has had a history of  gastroesophageal reflux.  She has arthritis in both knees.   Otherwise,  all review of systems are negative.  Please refer to our diagnostic  evaluation form.   PHYSICAL EXAMINATION:  VITAL SIGNS:  She is 5 feet and 3, weighs 130  pounds.  Her blood pressure is 120/80 in the right arm, her pulse is 70  and regular.  HEENT:  Normal.  Dentition is normal.  Oral mucosa is normal.  NECK:  Carotid upstrokes are equal bilaterally without bruits.  There is  no JVD.  Thyroid is not enlarged.  There is a thyroidectomy scar.  Trachea is midline.  CHEST/LUNGS:  Clear to auscultation and percussion.  HEART:  Nondisplaced PMI, normal S1 and S2.  No murmur or gallop.  ABDOMEN:  Soft, good bowel sounds.  No midline bruit, no thyromegaly.  EXTREMITIES:  No cyanosis, clubbing, or edema.  Pulses are intact.  Good  dorsalis pedis and posterior tibial.  No sign of DVT.  NEUROLOGIC:  Intact.  SKIN:  Unremarkable.  Her EKG is essentially normal except for possible left atrial  enlargement.   ASSESSMENT:  1. Shortness of breath and dyspnea on exertion.  Much of this may be      related to stress.  However, she has several risk factors for      coronary artery disease.  We need to rule out obstructive coronary      artery disease.  2. Other problems as listed above.   PLAN:  Exercise rest stress Myoview.  We will try to reproduce her  symptoms on the treadmill.  If this is negative for ischemia,  reassurance will be given.   I think a lot of this is related to domestic stress with her husband  being ill as mentioned above.  I will have her return in several weeks  with him, who I know well and one of his children to discuss being a  little bit easy on her.     Thomas C. Daleen Squibb, MD, [facility]  Electronically Signed    TCW/MedQ  DD: 09/26/2008  DT: 09/27/2008  Job #: [id number]   cc:   Dyke Maes, M.D.

## 2010-09-28 NOTE — Assessment & Plan Note (Signed)
[facility]                       [place] CARDIOLOGY OFFICE NOTE   Deanna, Maddox                      MRN:          [id number]  DATE:10/29/2008                            DOB:          08/17/1940    CARDIOLOGIST:  Jesse Sans. Daleen Squibb, MD, [facility]   PRIMARY CARE PHYSICIAN:  Edward L. Juanetta Gosling, MD   REASON FOR VISIT:  Followup.   HISTORY OF PRESENT ILLNESS:  [name] is a 70 year old female patient  with history of renal transplant and hypertension who was initially seen  by Dr. Daleen Squibb on Sep 26, 2008, for chest pain.  This seemed to be more  related to stress than anything else.  She was set up for a stress  Myoview study.  She exercised to workload of 5 mets and achieved 87% of  her age-predicted maximum.  She did develop significant EKG  abnormalities at low-level exercise without chest discomfort.  Her  nuclear images were normal with an EF of 65% and normal myocardial  perfusion.  Therefore, it was felt that her stress EKG was a false  positive.  She returns today for followup.  She continues with no chest  pain, shortness of breath with emotional stress.  She denies any  significant change.  Her husband is now in a nursing home.  He is  actually in the emergency room today with complaints of chest pain.  Apparently, he is quite agitated.  This has upset her quite a bit.  She  denies syncope, near syncope, orthopnea, PND, or pedal edema.   CURRENT MEDICATIONS:  1. CellCept 500 mg b.i.d.  2. Prograf 0.5 mg as directed.  3. Diflucan 200 mg half tablet daily.  4. Metoprolol 75 mg b.i.d.  5. Clonidine 0.2 mg b.i.d.  6. Micardis 80 mg daily.  7. Zetia 10 mg daily.  8. Aspirin 81 mg daily.  9. Kapidex 60 mg daily.  10.Clonazepam 0.5 mg day.  11.Levoxyl 0.075 mg daily.   PHYSICAL EXAMINATION:  GENERAL:  She is a well-nourished, well-developed  female in no acute distress.  VITAL SIGNS:  Blood pressure is 140/72, pulse 64, weight 132  pounds.  HEENT:  Normal.  NECK:  Without JVD.  CARDIAC:  S1 and S2.  Regular rate and rhythm.  LUNGS:  Clear to auscultation.  ABDOMEN:  Soft, nontender.  EXTREMITIES:  Without edema.  NEUROLOGIC:  She is alert and oriented x3.  Cranial II through XII are  grossly intact.   ASSESSMENT AND PLAN:  Chest pain and shortness of breath.  This is  likely secondary to significant emotional stress.  She may benefit from  different type of anxiolytic or antidepressant agent.  I will leave this  up to Dr. Juanetta Gosling.  She was also interviewed and examined by Dr. Daleen Squibb  today.  Her stress nuclear study did have a false positive EKG, but her  nuclear images were completely normal.  She requires no further cardiac  workup at this time.   DISPOSITION:  She can follow up with Cardiology on a p.r.n. basis.      Tereso Newcomer, PA-C  Electronically Signed  Thomas C. Daleen Squibb, MD, [facility]  Electronically Signed   SW/MedQ  DD: 10/29/2008  DT: 10/30/2008  Job #: [id number]   cc:   Ramon Dredge L. Juanetta Gosling, M.D.

## 2010-10-01 NOTE — Consult Note (Signed)
NAME:  [name], [name]               ACCOUNT NO.:  [id number]   MEDICAL RECORD NO.:  [id number]          PATIENT TYPE:  INP   LOCATION:  A325                          FACILITY:  [facility]   PHYSICIAN:  R. Roetta Sessions, M.D. DATE OF BIRTH:  May 25, 1940   DATE OF CONSULTATION:  12/07/2005  DATE OF DISCHARGE:                                   CONSULTATION   REQUESTING PHYSICIAN:  Dr. Shaune Pollack.   REASON FOR CONSULTATION:  Diarrhea.   HISTORY OF PRESENT ILLNESS:  The patient is a 70 year old Caucasian female  with acute onset of nausea, vomiting and diarrhea, which began yesterday  afternoon around 4 p.m.  After several hours, she presented to the emergency  department because of protracted vomiting and diarrhea.  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  She has hypertension, a history of leukopenia and anemia,  on Neupogen and Aranesp.  She just received her first 3 shots of Neupogen  last week.  She also took amoxicillin last week prior to dental work.  She  found out yesterday that she had multiple ill contacts from church members  who had viral gastroenteritis.  She also had recent vascular stenting of her  transplanted kidney within the last 2 weeks.  She denies any abdominal pain,  except that after vomiting.  She has not vomited this morning, but has  already had 3 more stools this a.m.  Stool studies are pending.  She denies  any hematemesis, melena or rectal bleeding.  She has heartburn, controlled  on Nexium.  She complains of gradual weight loss since her transplant, but  is specifically in the last 2 years since she has been on Diflucan.  She has  lost a total of approximately 35 pounds.  Denies any dysphagia or  odynophagia.  She eats very frequent meals, but does not really have a great  appetite.  Her last colonoscopy was by Dr.  Corinda Gubler about 4 years ago per her  report.  She states she is supposed to come back in 5 years because of  history of polyps.  She says she has had repeat negative stool Hemoccult  done through her hematologist because of her iron deficiency anemia.   MEDICATIONS AT HOME:  1.  Micardis 80 mg daily.  2.  Zetia 10 mg daily.  3.  Levothyroxine 75 mg daily.  4.  Restoril 15 mg nightly.  5.  Prograf 0.5 mg b.i.d.  6.  CellCept 500 mg b.i.d.  7.  Nexium 40 mg daily.  8.  Hemocyte daily.  9.  Diflucan 200 mg b.i.d. chronically.  10. Metoprolol 75 mg b.i.d.  11. Clonidine 5 mg daily, 2 mg b.i.d.  12. Aranesp injection every month.  13. Aspirin 81 mg daily.  14. Three injections of Neupogen last week.   ALLERGIES:  CODEINE and MORPHINE.   PAST MEDICAL HISTORY:  1.  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  She had multiple      stenting procedure over a course of 30 years to help preserve her kidney      function.  2.  She had cryptococcal pneumonia about 1 year after her transplant; she is      on chronic Diflucan therapy.  3.  She is status post partial thyroidectomy for goiter and is on thyroid      supplement.  4.  Hypertension.  5.  Hyperlipidemia.  6.  Iron deficiency anemia, no Aranesp one a month.  7.  Neutropenia, received Neupogen last week.  8.  Cholecystectomy.  9.  Partial hysterectomy with 1 remaining ovary.  10. Tubal ligation.  11. Tonsillectomy.  12. Back surgery.   SOCIAL HISTORY:  She is married with 5 children, 8 grandchildren and 2 great-  grandchildren and 1 on the way.  She has never been a smoker, used drugs or  alcohol.   FAMILY HISTORY:  Negative for colorectal cancer, peptic ulcer disease,  chronic GI illnesses, although had a brother who recently died of alcohol-  related cirrhosis.   REVIEW OF SYSTEMS:  See HPI for GI and CONSTITUTIONAL.  GU:  Denies any   dysuria, hematuria or decreased urinary output.   PHYSICAL EXAMINATION:  VITAL SIGNS:  T-max 102.5, T-current 98.7.  Pulse 81.  Respirations 18.  Blood pressure 129/71.  Weight 109.  Height 63 inches.  GENERAL:  A pleasant, thin Caucasian female in no acute distress.  SKIN:  Warm and dry, no jaundice.  HEENT:  Sclerae are anicteric.  Oropharyngeal mucosa moist and pink.  CHEST:  Lungs are clear to auscultation.  CARDIAC:  Exam reveals a regular rate and rhythm, normal S1 and S2, no  murmurs, rubs, or gallops.  ABDOMEN:  Positive bowel sounds.  Soft.  Non-distended.  Well-healed right  lower quadrant incision from transplant with fullness in this area.  Abdomen  is nontender.  No organomegaly or masses.  EXTREMITIES:  No edema.   LABORATORY DATA:  White count 8400, hemoglobin 12.3, hematocrit 35.5,  platelets 165,000.  Sodium 141, potassium 3.7, BUN 23, creatinine 1.3,  glucose 131, total bilirubin 0.3, alkaline phosphatase 83, AST 33, ALT 27,  albumin 4.   IMPRESSION:  The patient is a 70 year old Caucasian female with acute-onset  nausea, vomiting, diarrhea and fever.  She has a  complicated history  secondary to recent ill contacts, recent antibiotic use, history of  neutropenia and ongoing immunosuppressive therapy.  She also recently had  stenting of her transplanted renal vasculature.  I suspect we are dealing  with a viral gastroenteritis secondary to abrupt onset of symptoms.  However, we do need to exclude the possibility of Clostridium difficile and  other infectious etiologies because of her high-risk status.   RECOMMENDATIONS:  1.  Supportive measures.  2.  Met-7 in the morning.  3.  Follow up stool studies.  4.  We will discuss possibility of using antibiotics to provide antibiotic      coverage with Dr. Jena Gauss.      Tana Coast, P.AJonathon Bellows, M.D.  Electronically Signed    LL/MEDQ  D:  12/07/2005  T:  12/07/2005  Job:  [id number]  cc:   Ramon Dredge  L. Juanetta Gosling, M.D.  Fax: [phone]

## 2010-10-01 NOTE — Group Therapy Note (Signed)
NAMERAEGEN, TARPLEY NO.:  [id number]   MEDICAL RECORD NO.:  [id number]          PATIENT TYPE:  INP   LOCATION:  A325                          FACILITY:  [facility]   PHYSICIAN:  Edward L. Juanetta Gosling, M.D.DATE OF BIRTH:  05/06/1941   DATE OF PROCEDURE:  DATE OF DISCHARGE:                                   PROGRESS NOTE   Mrs. Eldredge is much better this morning.  She is still having some  abdominal cramping but she did not have any diarrhea last night.   Physical examination this morning shows temperature 98.3, pulse 63,  respirations 20, blood pressure 105/63, O2 saturation is 100% on room air.  Her abdomen is actually fairly soft.   ASSESSMENT:  She has probably gastroenteritis; in fact, her husband says he  thinks he is getting now.  She has had a kidney transplant though and is at  risk of other problems considering her immunosuppressed state.  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   My plan then is to go ahead and advance her diet slightly, give her some  more fluids.  She will probably be ready for discharge tomorrow, provided  she continues to improve.      Edward L. Juanetta Gosling, M.D.  Electronically Signed     ELH/MEDQ  D:  12/08/2005  T:  12/08/2005  Job:  [id number]

## 2010-10-01 NOTE — Procedures (Signed)
[facility]  Patient:    [name], GIARRUSSO Visit Number: [id number] MRN: [id number]          Service Type: OUT Location: RAD Attending Physician:  Cain Sieve Dictated by:   Joellyn Rued, P.A.-C. Proc. Date: 10/31/01 Admit Date:  10/31/2001   CC:         Dyke Maes, M.D.  Kari Baars, M.D.   Stress Test  DATE OF BIRTH:  Apr 15, 1941  REFERRING PHYSICIAN:  Dyke Maes, M.D.  CARDIOLOGIST:  Valera Castle, M.D.  SUMMARY OF HISTORY:  [name] is a 70 year old white female who has been followed by Dr. 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  Most likely as a long history of reflux and recurrent pyelonephritis.  She is interested in undergoing renal transplantation, thus they requested a presurgical stress Cardiolite.  She has a history of hypothyroidism, hyperlipidemia, anemia, and GERD.  Resting EKG showed normal sinus rhythm, nonspecific ST-T wave changes.  Blood pressure 152/80.  Utilizing the Bruce protocol, [name] ambulated for a total of five minutes and 28 seconds achieving 7.0 METS.  Maximum heart rate was 160.  Maximum blood pressure was 160/80 (resting blood pressure 152/80, resting heart rate 95).  She achieved 99% of her predicted maximum heart rate. During the test she complained of calf "tired and weak."  She did not have any acute EKG changes.  In recovery rate and blood pressure achieved baseline in approximately five minutes.  Final report and images are pending Dr. Anola Gurney review. Dictated by:   Joellyn Rued, P.A.-C. Attending Physician:  Cain Sieve DD:  10/31/01 TD:  11/01/01 Job: [id number]

## 2010-10-01 NOTE — Group Therapy Note (Signed)
NAMEJANN, MILKOVICH NO.:  [id number]   MEDICAL RECORD NO.:  [id number]          PATIENT TYPE:  INP   LOCATION:  A325                          FACILITY:  [facility]   PHYSICIAN:  Edward L. Juanetta Gosling, M.D.DATE OF BIRTH:  [birth year]   DATE OF PROCEDURE:  12/07/2005  DATE OF DISCHARGE:                                   PROGRESS NOTE   Ms. Goodchild was admitted last night with abdominal discomfort, nausea,  vomiting, and diarrhea.  She has no new complaints this morning.  She has  not vomited any more but she still has diarrhea.  This is complicated by the  fact that she has had a renal transplant.  Her examination today shows her  temperature is 98.6, pulse 108, respirations 20, blood pressure 146/78, O2  saturation is 100% on room air.  Her weight 109.  Her chest is actually  fairly clear.  Her heart is regular.  Her abdomen is soft.  Her BUN was 23,  creatinine of 1.3 yesterday.   ASSESSMENT:  Considering the fact that she has had a renal transplant and is  on significant immunosuppressive medications and has had a cryptococcal  pulmonary infection, I think rather than simply assuming that this is a  simple gastroenteritis, I should go ahead and get GI to see her.  I am going  to go ahead and get cultures and follow.      Edward L. Juanetta Gosling, M.D.  Electronically Signed     ELH/MEDQ  D:  12/07/2005  T:  12/07/2005  Job:  [id number]

## 2010-10-01 NOTE — H&P (Signed)
NAME:  [name], [name]               ACCOUNT NO.:  [id number]   MEDICAL RECORD NO.:  [id number]          PATIENT TYPE:  INP   LOCATION:  A325                          FACILITY:  [facility]   PHYSICIAN:  Angus G. Renard Matter, MD   DATE OF BIRTH:  Mar 18, 1941   DATE OF ADMISSION:  12/06/2005  DATE OF DISCHARGE:  LH                                HISTORY & PHYSICAL   A 70 year old white female admitted to the ED with the chief complaint being  nausea and vomiting which has been present for several hours prior to her  being seen in the ED.  This patient developed nausea and diarrhea for most  of the day prior to her being seen in the ED.  She has had several loose  stools.  In spite of medications given in the emergency department, she  continued to have vomiting.  The patient does have a history of having had a  kidney transplant in 2004 at [facility] and had subsequent  cryptococcal pneumonia.  She has also run some fever today as well that has  not responded to medications given in ED.   LABORATORY DATA:  CBC: WBC 8400, hemoglobin 12.3, hematocrit 35% with 94%  neutrophils, 7.9 granulocytes.  Chemistries show sodium 141, potassium 3.7,  chloride 109, CO2 22, glucose 131, BUN 23, creatinine 1.3.   The patient was started on IV fluids, was given medication for vomiting, and  subsequently admitted.   SOCIAL HISTORY:  The patient does not smoke or drink alcohol.   FAMILY HISTORY:  Positive for hypertension, coronary artery disease.   PAST MEDICAL HISTORY:  The patient has a history of having had a kidney  transplant on the left in 2004, and about a year later, she had cryptococcal  pneumonia.  She does have a history of hypothyroidism, hypertension,  hyperlipidemia, and iron-deficiency anemia.   ALLERGIES:  CODEINE and MORPHINE.   MEDICATIONS:  1.  Micardis 80 mg.  2.  Zetia 10 mg.  3.  Levothyroxine 75 mg.  4.  Restoril 15 mg at bedtime.  5.  Prograf 0.5 mg b.i.d.  6.  CellCept 500  mg b.i.d.  7.  Nexium 40 mg daily.   REVIEW OF SYSTEMS:  HEENT: Negative.  CARDIOPULMONARY: No cough, no chest  pain. GASTROINTESTINAL: The patient is having episodes of vomiting and  diarrhea for most of the day.  No bleeding. GU: No dysuria or hematuria.   PHYSICAL EXAMINATION:  GENERAL:  Uncomfortable white female because of  nausea.  VITAL SIGNS: Blood pressure 164/80, respirations 28, heart rate 87.  HEENT: Eyes PERRLA.  TMs negative.  Oropharynx benign.  NECK: Supple.  No JVD or thyroid abnormalities.  HEART:  Regular rhythm, no murmurs.  LUNGS: Clear to percussion and auscultation.  ABDOMEN:  No palpable organs or mass, no organomegaly.  EXTREMITIES:  Free of edema.  SKIN:  Warm and dry.   DIAGNOSIS:  1.  Acute gastroenteritis.  2.  History of kidney transplant.      Angus G. Renard Matter, MD  Electronically Signed     AGM/MEDQ  D:  12/06/2005  T:  12/06/2005  Job:  [id number]

## 2010-10-01 NOTE — Discharge Summary (Signed)
NAMEJESENIA, SPERA NO.:  [id number]   MEDICAL RECORD NO.:  [id number]          PATIENT TYPE:  INP   LOCATION:  A325                          FACILITY:  [facility]   PHYSICIAN:  Edward L. Juanetta Gosling, M.D.DATE OF BIRTH:  [birth year]   DATE OF ADMISSION:  12/06/2005  DATE OF DISCHARGE:  07/27/2007LH                                 DISCHARGE SUMMARY   DISCHARGE DIAGNOSES:  1.  Acute gastroenteritis.  2.  History of renal transplant.  3.  History of cryptococcal pneumonia.  4.  Hypertension.  5.  Hypothyroidism.  6.  Hyperlipidemia.  7.  Iron deficiency anemia.  8.  Gastroesophageal reflux disease.  9.  Hypokalemia.   HISTORY OF PRESENT ILLNESS:  Ms. Hladik is a 70 year old who has had a  renal transplant about 3 years ago who has been in fairly good health and  who developed increasing problems with nausea, vomiting and diarrhea several  hours prior to being seen.  When she came to the emergency room, she was  treated, but continued to have nausea, vomiting and diarrhea was admitted  because of that.  She did have some fever as well.  No one else in the  family has been sick, that she knows of.  She has not been around any other  folks who have had any sort of gastroenteritis, food poisoning, etc.   PHYSICAL EXAMINATION:  GENERAL:  Her exam showed that she was uncomfortable  because of nausea.  VITAL SIGNS:  Blood pressure 164/80, respirations 28, heart rate 87.  HEENT:  Her pupils were reactive.  Nose and throat were clear.  NECK:  Supple.  CHEST:  Her chest was clear.  ABDOMEN:  Her abdomen was minimally tender.  Bowel sounds are slightly  hyperactive.  CNS was grossly intact.   HOSPITAL COURSE:  She was started on intravenous fluids.  Her potassium was  slightly low.  This was replaced. GI consultation was obtained because of  her immunosuppressed status and it was felt that this probably was  gastroenteritis.  She had stools for O&P, blood etc.  These were  negative.  No Clostridium difficile noted.  She improved and was discharged home to  continue with her regular medications at home.   DISCHARGE MEDICATIONS:  1.  Levsin sublingually every 4 hours p.r.n.  2.  Micardis 80 mg daily.  3.  Zetia 10 mg daily.  4.  Levofloxacin 75 mcg daily.  5.  Hemocyte one daily.  6.  Restoril 15 mg nightly.  7.  Prograf 0.5 b.i.d.  8.  CellCept 250 mg b.i.d.  9.  Diflucan 200 mg b.i.d.  10. Nexium 40 mg daily.  11. Metoprolol 75 mg b.i.d.  12. Clonidine 0.2 b.i.d.      Ramon Dredge L. Juanetta Gosling, M.D.  Electronically Signed     ELH/MEDQ  D:  12/09/2005  T:  12/09/2005  Job:  [id number]

## 2010-10-01 NOTE — Group Therapy Note (Signed)
NAMELAVILLA, DELAMORA NO.:  [id number]   MEDICAL RECORD NO.:  [id number]          PATIENT TYPE:  INP   LOCATION:  A325                          FACILITY:  [facility]   PHYSICIAN:  Edward L. Juanetta Gosling, M.D.DATE OF BIRTH:  Feb 16, 1941   DATE OF PROCEDURE:  12/09/2005  DATE OF DISCHARGE:                                   PROGRESS NOTE   Ms. Odwyer says she feels great and wants to go home.  She had one stool.  It was not diarrhea but it was slightly loose.  She has not had any  abdominal pain.  She feels well.  Her physical examination today shows that  she is awake and alert.  Temperature is 98.7, pulse 70, respirations 20,  blood pressure 132/69, O2 saturations 100% on room air.  Her abdomen is much  softer.  Her chest is clear.  Extremities showed no edema.  Laboratory work  this morning:  Electrolytes are normal.  Chloride is 113.  Her C. difficile  was negative.   ASSESSMENT:  She is better.   PLAN:  Discharge home.      Edward L. Juanetta Gosling, M.D.  Electronically Signed     ELH/MEDQ  D:  12/09/2005  T:  12/09/2005  Job:  [id number]

## 2010-10-01 NOTE — H&P (Signed)
NAME:  [name], [name]               ACCOUNT NO.:  [id number]   MEDICAL RECORD NO.:  [id number]          PATIENT TYPE:  INP   LOCATION:  A325                          FACILITY:  [facility]   PHYSICIAN:  Angus G. Renard Matter, MD   DATE OF BIRTH:  03-Sep-1940   DATE OF ADMISSION:  12/06/2005  DATE OF DISCHARGE:  LH                                HISTORY & PHYSICAL   HISTORY OF PRESENT ILLNESS:  This 70 year old, white female was admitted to  the ED with   Dictation ended at this point.      Angus G. Renard Matter, MD  Electronically Signed     AGM/MEDQ  D:  12/06/2005  T:  12/06/2005  Job:  [id number]

## 2011-02-15 LAB — URINALYSIS, ROUTINE W REFLEX MICROSCOPIC
Nitrite: NEGATIVE
Specific Gravity, Urine: 1.025
Urobilinogen, UA: 0.2
pH: 5

## 2011-02-15 LAB — COMPREHENSIVE METABOLIC PANEL
ALT: 16
AST: 16
Alkaline Phosphatase: 78
CO2: 23
Calcium: 9.1
GFR calc Af Amer: 29 — ABNORMAL LOW
Glucose, Bld: 124 — ABNORMAL HIGH
Potassium: 4
Sodium: 135
Total Protein: 6.8

## 2011-02-15 LAB — DIFFERENTIAL
Basophils Relative: 0
Eosinophils Absolute: 0.1
Eosinophils Relative: 2
Lymphs Abs: 1.2
Monocytes Relative: 8
Neutrophils Relative %: 73

## 2011-02-15 LAB — CBC
Hemoglobin: 11.6 — ABNORMAL LOW
RBC: 3.89
RDW: 15.1

## 2011-02-15 LAB — CULTURE, BLOOD (ROUTINE X 2)
Culture: NO GROWTH
Report Status: 11162009
Report Status: 11162009

## 2011-02-24 LAB — CBC
Platelets: 207
RDW: 14.2 — ABNORMAL HIGH
WBC: 5.8

## 2011-02-24 LAB — IRON AND TIBC
Iron: 73
Saturation Ratios: 27
TIBC: 269
UIBC: 196

## 2011-02-24 LAB — DIFFERENTIAL
Basophils Absolute: 0
Eosinophils Relative: 3
Lymphocytes Relative: 23
Lymphs Abs: 1.3
Neutro Abs: 3.8
Neutrophils Relative %: 67

## 2011-02-24 LAB — FERRITIN: Ferritin: 1364 — ABNORMAL HIGH (ref 10–291)

## 2011-02-25 LAB — COMPREHENSIVE METABOLIC PANEL
ALT: 23
Albumin: 3.9
BUN: 20
Calcium: 9.2
Glucose, Bld: 95
Sodium: 140
Total Protein: 6.9

## 2011-02-25 LAB — PHOSPHORUS: Phosphorus: 4.7 — ABNORMAL HIGH

## 2011-02-25 LAB — DIFFERENTIAL
Lymphs Abs: 0.8
Monocytes Absolute: 0.4
Monocytes Relative: 8
Neutro Abs: 3.3
Neutrophils Relative %: 70

## 2011-02-25 LAB — CBC
Hemoglobin: 11.7 — ABNORMAL LOW
MCHC: 33.8
Platelets: 229
RDW: 14.9 — ABNORMAL HIGH

## 2011-02-25 LAB — LIPID PANEL
LDL Cholesterol: 117 — ABNORMAL HIGH
Triglycerides: 276 — ABNORMAL HIGH

## 2011-02-25 LAB — TACROLIMUS, BLOOD: Tacrolimus Lvl: 5.5

## 2011-02-28 LAB — COMPREHENSIVE METABOLIC PANEL
Albumin: 4.1
BUN: 33 — ABNORMAL HIGH
Calcium: 8.8
Creatinine, Ser: 1.77 — ABNORMAL HIGH
Total Bilirubin: 0.8
Total Protein: 7.2

## 2011-02-28 LAB — DIFFERENTIAL
Basophils Absolute: 0.1
Lymphocytes Relative: 22
Lymphs Abs: 1
Monocytes Absolute: 0.3
Monocytes Relative: 7
Neutro Abs: 2.9

## 2011-02-28 LAB — FERRITIN: Ferritin: 1812 — ABNORMAL HIGH (ref 10–291)

## 2011-02-28 LAB — CBC
HCT: 32 — ABNORMAL LOW
MCHC: 34
MCV: 86
Platelets: 286
RDW: 16.3 — ABNORMAL HIGH

## 2011-02-28 LAB — PHOSPHORUS: Phosphorus: 4.9 — ABNORMAL HIGH

## 2011-02-28 LAB — MAGNESIUM: Magnesium: 1.6

## 2011-02-28 LAB — IRON AND TIBC
Saturation Ratios: 24
TIBC: 391

## 2011-03-01 LAB — TACROLIMUS, BLOOD: Tacrolimus Lvl: 8.3

## 2011-03-01 LAB — COMPREHENSIVE METABOLIC PANEL
AST: 71 — ABNORMAL HIGH
CO2: 22
Calcium: 9.1
Creatinine, Ser: 2.26 — ABNORMAL HIGH
GFR calc Af Amer: 26 — ABNORMAL LOW
GFR calc non Af Amer: 22 — ABNORMAL LOW

## 2011-03-01 LAB — DIFFERENTIAL
Eosinophils Relative: 2
Lymphocytes Relative: 21
Lymphs Abs: 0.9
Neutro Abs: 3
Neutrophils Relative %: 67

## 2011-03-01 LAB — CBC
MCHC: 33.7
MCV: 85.8
Platelets: 312
RBC: 3.48 — ABNORMAL LOW

## 2011-03-01 LAB — PHOSPHORUS: Phosphorus: 4.8 — ABNORMAL HIGH

## 2011-03-01 LAB — MAGNESIUM: Magnesium: 1.7

## 2011-03-03 LAB — DIFFERENTIAL
Basophils Absolute: 0.1
Basophils Relative: 1
Eosinophils Absolute: 0.1
Eosinophils Relative: 3
Lymphocytes Relative: 25
Lymphs Abs: 1
Monocytes Absolute: 0.3
Monocytes Relative: 8
Neutro Abs: 2.6
Neutrophils Relative %: 62

## 2011-03-03 LAB — CBC
HCT: 28.9 — ABNORMAL LOW
Hemoglobin: 10.1 — ABNORMAL LOW
MCHC: 35
MCV: 83
Platelets: 383
RBC: 3.48 — ABNORMAL LOW
RDW: 14.6 — ABNORMAL HIGH
WBC: 4.2

## 2011-03-03 LAB — TACROLIMUS, BLOOD: Tacrolimus Lvl: 5.6

## 2011-03-03 LAB — LIPID PANEL
HDL: 25 — ABNORMAL LOW
Total CHOL/HDL Ratio: 6.9
Triglycerides: 272 — ABNORMAL HIGH
VLDL: 54 — ABNORMAL HIGH

## 2011-03-03 LAB — COMPREHENSIVE METABOLIC PANEL
ALT: 82 — ABNORMAL HIGH
AST: 74 — ABNORMAL HIGH
CO2: 23
Chloride: 108
GFR calc Af Amer: 29 — ABNORMAL LOW
GFR calc non Af Amer: 24 — ABNORMAL LOW
Potassium: 4.9
Sodium: 139
Total Bilirubin: 0.3

## 2011-03-03 LAB — MAGNESIUM: Magnesium: 1.8

## 2012-06-05 ENCOUNTER — Other Ambulatory Visit (HOSPITAL_COMMUNITY): Payer: Self-pay | Admitting: *Deleted

## 2012-06-05 ENCOUNTER — Ambulatory Visit (HOSPITAL_COMMUNITY)
Admission: RE | Admit: 2012-06-05 | Discharge: 2012-06-05 | Disposition: A | Discharge status: Home / Self Care | Payer: Medicare Other | Source: Ambulatory Visit | Attending: *Deleted | Admitting: *Deleted

## 2012-06-05 DIAGNOSIS — B459 Cryptococcosis, unspecified: Secondary | ICD-10-CM

## 2012-06-05 DIAGNOSIS — R918 Other nonspecific abnormal finding of lung field: Secondary | ICD-10-CM | POA: Insufficient documentation

## 2012-08-20 ENCOUNTER — Other Ambulatory Visit (HOSPITAL_COMMUNITY): Payer: Self-pay | Admitting: "Endocrinology

## 2012-08-20 DIAGNOSIS — Z139 Encounter for screening, unspecified: Secondary | ICD-10-CM

## 2012-08-23 ENCOUNTER — Ambulatory Visit (HOSPITAL_COMMUNITY)
Admission: RE | Admit: 2012-08-23 | Discharge: 2012-08-23 | Disposition: A | Discharge status: Home / Self Care | Payer: Medicare Other | Source: Ambulatory Visit | Attending: "Endocrinology | Admitting: "Endocrinology

## 2012-08-23 DIAGNOSIS — Z13820 Encounter for screening for osteoporosis: Secondary | ICD-10-CM | POA: Insufficient documentation

## 2012-08-23 DIAGNOSIS — Z780 Asymptomatic menopausal state: Secondary | ICD-10-CM | POA: Insufficient documentation

## 2012-08-23 DIAGNOSIS — Z940 Kidney transplant status: Secondary | ICD-10-CM | POA: Insufficient documentation

## 2012-08-23 DIAGNOSIS — M810 Age-related osteoporosis without current pathological fracture: Secondary | ICD-10-CM | POA: Insufficient documentation

## 2012-08-23 DIAGNOSIS — Z139 Encounter for screening, unspecified: Secondary | ICD-10-CM

## 2013-02-14 ENCOUNTER — Other Ambulatory Visit: Payer: Self-pay | Admitting: Endocrinology

## 2013-04-22 ENCOUNTER — Other Ambulatory Visit: Payer: Self-pay | Admitting: Nephrology

## 2013-04-22 ENCOUNTER — Ambulatory Visit
Admission: RE | Admit: 2013-04-22 | Discharge: 2013-04-22 | Disposition: A | Discharge status: Home / Self Care | Payer: Medicare Other | Source: Ambulatory Visit | Attending: Nephrology | Admitting: Nephrology

## 2013-04-22 DIAGNOSIS — R05 Cough: Secondary | ICD-10-CM

## 2013-09-28 ENCOUNTER — Ambulatory Visit (HOSPITAL_COMMUNITY)
Admission: RE | Admit: 2013-09-28 | Discharge: 2013-09-28 | Disposition: A | Discharge status: Home / Self Care | Payer: Medicare Other | Source: Ambulatory Visit | Attending: *Deleted | Admitting: *Deleted

## 2013-09-28 ENCOUNTER — Other Ambulatory Visit: Payer: Self-pay | Admitting: *Deleted

## 2013-09-28 ENCOUNTER — Encounter (HOSPITAL_COMMUNITY): Payer: Self-pay

## 2013-09-28 DIAGNOSIS — M47812 Spondylosis without myelopathy or radiculopathy, cervical region: Secondary | ICD-10-CM

## 2013-09-28 DIAGNOSIS — M5030 Other cervical disc degeneration, unspecified cervical region: Secondary | ICD-10-CM | POA: Insufficient documentation

## 2013-09-28 DIAGNOSIS — M4120 Other idiopathic scoliosis, site unspecified: Secondary | ICD-10-CM | POA: Insufficient documentation

## 2013-09-28 DIAGNOSIS — M549 Dorsalgia, unspecified: Secondary | ICD-10-CM | POA: Diagnosis present

## 2014-05-13 ENCOUNTER — Other Ambulatory Visit (HOSPITAL_COMMUNITY): Payer: Self-pay | Admitting: Nephrology

## 2014-05-13 DIAGNOSIS — R0989 Other specified symptoms and signs involving the circulatory and respiratory systems: Secondary | ICD-10-CM

## 2014-05-15 ENCOUNTER — Ambulatory Visit (HOSPITAL_COMMUNITY)
Admission: RE | Admit: 2014-05-15 | Discharge: 2014-05-15 | Disposition: A | Discharge status: Home / Self Care | Payer: Medicare Other | Source: Ambulatory Visit | Attending: Nephrology | Admitting: Nephrology

## 2014-05-15 DIAGNOSIS — I10 Essential (primary) hypertension: Secondary | ICD-10-CM | POA: Insufficient documentation

## 2014-05-15 DIAGNOSIS — R0989 Other specified symptoms and signs involving the circulatory and respiratory systems: Secondary | ICD-10-CM | POA: Diagnosis present

## 2014-05-15 DIAGNOSIS — E785 Hyperlipidemia, unspecified: Secondary | ICD-10-CM | POA: Diagnosis not present

## 2014-05-15 DIAGNOSIS — E119 Type 2 diabetes mellitus without complications: Secondary | ICD-10-CM | POA: Diagnosis not present

## 2014-05-26 ENCOUNTER — Other Ambulatory Visit: Payer: Self-pay | Admitting: *Deleted

## 2014-05-26 DIAGNOSIS — I6523 Occlusion and stenosis of bilateral carotid arteries: Secondary | ICD-10-CM

## 2014-06-12 ENCOUNTER — Encounter: Payer: Self-pay | Admitting: Surgery

## 2014-06-16 ENCOUNTER — Other Ambulatory Visit: Payer: Self-pay | Admitting: *Deleted

## 2014-06-16 ENCOUNTER — Ambulatory Visit (INDEPENDENT_AMBULATORY_CARE_PROVIDER_SITE_OTHER): Payer: Medicare Other | Admitting: Surgery

## 2014-06-16 ENCOUNTER — Ambulatory Visit (HOSPITAL_COMMUNITY)
Admission: RE | Admit: 2014-06-16 | Discharge: 2014-06-16 | Disposition: A | Discharge status: Home / Self Care | Payer: Medicare Other | Source: Ambulatory Visit | Attending: Surgery | Admitting: Surgery

## 2014-06-16 ENCOUNTER — Encounter: Payer: Self-pay | Admitting: Surgery

## 2014-06-16 VITALS — BP 124/59 | HR 54 | Ht 63.0 in | Wt 98.9 lb

## 2014-06-16 DIAGNOSIS — I6523 Occlusion and stenosis of bilateral carotid arteries: Secondary | ICD-10-CM

## 2014-06-16 NOTE — Progress Notes (Signed)
Patient name: Deanna ShortsLinda T Maddox MRN: [id number] DOB: 26-Aug-1940 Sex: female   Referred by: Dr. Briant CedarMattingly  Reason for referral:  Chief Complaint  Patient presents with  . New Evaluation    Rt ICA stenosis    HISTORY OF PRESENT ILLNESS: This is a 74 year old female who is referred today for carotid stenosis.  A bruit was initially detected on physical examination which led to a ultrasound which revealed greater than 70% right carotid stenosis and approximately less than 50% left carotid stenosis.  She remains asymptomatic.  Specifically she denies numbness or weakness in either extremity.  She denies slurred speech.  She denies amaurosis fugax.  The patient has a history of a kidney transplant.  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  She had a right-sided catheter for 2 months prior to her living related transplant.  The patient is intolerant of statins as the Colles leg pain.  She is a diabetic which is controlled very well with insulin.  She says her blood sugars running in the 90s.  She is medically managed for hypertension.  She is a nonsmoker.  Past Medical History  Diagnosis Date  . Diabetes mellitus   . Hypertension   . H/O kidney transplant 2004    [facility]  . Anemia   . Chronic kidney disease     Past Surgical History  Procedure Laterality Date  . Kidney transplant Right   . Kidney surgery      multiple  . Cholecystectomy    . Thyroidectomy      1/2 removed  . Back surgery      History   Social History  . Marital Status: Widowed    Spouse Name: N/A    Number of Children: N/A  . Years of Education: N/A   Occupational History  . Not on file.   Social History Main Topics  . Smoking status: Never Smoker   . Smokeless tobacco: Never Used  . Alcohol Use: No  . Drug Use: No  . Sexual Activity: Not on file   Other Topics Concern  . Not on file   Social History Narrative    Family History  Problem Relation Age of Onset  .  Diabetes Mother   . Hypertension Mother   . Heart disease Father   . Varicose Veins Father   . Hypertension Sister   . Cancer Daughter   . Hypertension Son     Allergies as of 06/16/2014 - Review Complete 06/16/2014  Allergen Reaction Noted  . Codeine  06/16/2014  . Elavil [amitriptyline]  06/16/2014  . Iodinated diagnostic agents  06/16/2014  . Lipitor [atorvastatin]  06/16/2014  . Morphine and related  06/16/2014  . Neurontin [gabapentin]  06/16/2014  . Niacin and related  06/16/2014  . Norvasc [amlodipine besylate]  06/16/2014  . Talwin [pentazocine]  06/16/2014  . Ultram [tramadol]  06/16/2014    No current outpatient prescriptions on file prior to visit.   No current facility-administered medications on file prior to visit.     REVIEW OF SYSTEMS: Cardiovascular: No chest pain, chest pressure,orthopnea, or dyspnea on exertion. No claudication or rest pain,  No history of DVT or phlebitis.occasional palpitations Pulmonary: No productive cough, asthma or wheezing. Neurologic: No weakness, paresthesias, aphasia, or amaurosis. No dizziness. Hematologic: No bleeding problems or clotting disorders. Musculoskeletal: No joint pain or joint swelling. Gastrointestinal: No blood in stool or hematemesis Genitourinary: No dysuria or hematuria. Psychiatric:: No history of major depression.  Integumentary: No rashes or ulcers. Constitutional: No fever or chills.  PHYSICAL EXAMINATION: General: The patient appears their stated age.  Vital signs are BP 124/59 mmHg  Pulse 54  Ht  (1.6 m)  Wt 98 lb 14.4 oz (44.861 kg)  BMI 17.52 kg/m2  SpO2 100% HEENT:  No gross abnormalities Pulmonary: Respirations are non-labored Abdomen: Soft and non-tender .  No abdominal bruit Musculoskeletal: There are no major deformities.   Neurologic: No focal weakness or paresthesias are detected, Skin: There are no ulcer or rashes noted. Psychiatric: The patient has normal  affect. Cardiovascular: There is a regular rate and rhythm without significant murmur appreciated.right carotid bruit  Diagnostic Studies: Outside ultrasound shows greater than 70% right carotid stenosis, less than 50% left carotid stenosis . Limited ultrasound was repeated today in our office.  This shows 60-79% right carotid stenosis.   Assessment:  Asymptomatic bilateral carotid stenosis, right greater than left Plan: I discussed with the patient the importance of medical management.  By duplex ultrasound today, I do not think her stenosis is greater than 80% which would be the threshold for treatment of an asymptomatic patient.  I also discussed the possibility of enrollment in [facility] if her stenosis progresses.  I also discussed the signs and symptoms of stroke and what to do should they occur.  She will follow up in 6 months with a repeat ultrasound.     Jorge Ny, M.D. [facility] Office: [phone] Pager:  [phone]

## 2014-07-28 ENCOUNTER — Emergency Department (HOSPITAL_COMMUNITY)
Admission: EM | Admit: 2014-07-28 | Discharge: 2014-07-28 | Disposition: A | Discharge status: Home / Self Care | Payer: Medicare Other | Attending: Emergency Medicine | Admitting: Emergency Medicine

## 2014-07-28 ENCOUNTER — Emergency Department (HOSPITAL_COMMUNITY): Payer: Medicare Other

## 2014-07-28 ENCOUNTER — Encounter (HOSPITAL_COMMUNITY): Payer: Self-pay

## 2014-07-28 DIAGNOSIS — Z794 Long term (current) use of insulin: Secondary | ICD-10-CM | POA: Diagnosis not present

## 2014-07-28 DIAGNOSIS — E119 Type 2 diabetes mellitus without complications: Secondary | ICD-10-CM | POA: Insufficient documentation

## 2014-07-28 DIAGNOSIS — L03115 Cellulitis of right lower limb: Secondary | ICD-10-CM | POA: Insufficient documentation

## 2014-07-28 DIAGNOSIS — Z940 Kidney transplant status: Secondary | ICD-10-CM | POA: Diagnosis not present

## 2014-07-28 DIAGNOSIS — N189 Chronic kidney disease, unspecified: Secondary | ICD-10-CM | POA: Insufficient documentation

## 2014-07-28 DIAGNOSIS — M25561 Pain in right knee: Secondary | ICD-10-CM | POA: Diagnosis present

## 2014-07-28 DIAGNOSIS — Z7982 Long term (current) use of aspirin: Secondary | ICD-10-CM | POA: Diagnosis not present

## 2014-07-28 DIAGNOSIS — Z79899 Other long term (current) drug therapy: Secondary | ICD-10-CM | POA: Insufficient documentation

## 2014-07-28 DIAGNOSIS — I129 Hypertensive chronic kidney disease with stage 1 through stage 4 chronic kidney disease, or unspecified chronic kidney disease: Secondary | ICD-10-CM | POA: Insufficient documentation

## 2014-07-28 DIAGNOSIS — Z862 Personal history of diseases of the blood and blood-forming organs and certain disorders involving the immune mechanism: Secondary | ICD-10-CM | POA: Insufficient documentation

## 2014-07-28 MED ORDER — HYDROCODONE-ACETAMINOPHEN 5-325 MG PO TABS: 1.0000 | ORAL_TABLET | Freq: Four times a day (QID) | ORAL | Status: DC | PRN

## 2014-07-28 MED ORDER — CEPHALEXIN 500 MG PO CAPS: 500.0000 mg | ORAL_CAPSULE | Freq: Three times a day (TID) | ORAL | Status: DC

## 2014-07-28 MED ORDER — CEPHALEXIN 500 MG PO CAPS: 500.0000 mg | ORAL_CAPSULE | Freq: Four times a day (QID) | ORAL | Status: DC

## 2014-07-28 NOTE — ED Notes (Addendum)
Pt reports ran into her end table approx 1 week ago and scraped r knee.  Reports as time went on, pt says area is very sore and unable to bend it without severe pain.  Pt went to urgent care first and was sent here to rule out septic joint.  Abrasion noted to r knee and redness.  Pt had x ray at Urgent care and brought a copy on a cd.

## 2014-07-28 NOTE — Discharge Instructions (Signed)
Keflex as prescribed.  Hydrocodone as prescribed as needed for pain.  Return to the ER if your symptoms substantially worsen or change.   Cellulitis Cellulitis is an infection of the skin and the tissue beneath it. The infected area is usually red and tender. Cellulitis occurs most often in the arms and lower legs.  CAUSES  Cellulitis is caused by bacteria that enter the skin through cracks or cuts in the skin. The most common types of bacteria that cause cellulitis are staphylococci and streptococci. SIGNS AND SYMPTOMS   Redness and warmth.  Swelling.  Tenderness or pain.  Fever. DIAGNOSIS  Your health care provider can usually determine what is wrong based on a physical exam. Blood tests may also be done. TREATMENT  Treatment usually involves taking an antibiotic medicine. HOME CARE INSTRUCTIONS   Take your antibiotic medicine as directed by your health care provider. Finish the antibiotic even if you start to feel better.  Keep the infected arm or leg elevated to reduce swelling.  Apply a warm cloth to the affected area up to 4 times per day to relieve pain.  Take medicines only as directed by your health care provider.  Keep all follow-up visits as directed by your health care provider. SEEK MEDICAL CARE IF:   You notice red streaks coming from the infected area.  Your red area gets larger or turns dark in color.  Your bone or joint underneath the infected area becomes painful after the skin has healed.  Your infection returns in the same area or another area.  You notice a swollen bump in the infected area.  You develop new symptoms.  You have a fever. SEEK IMMEDIATE MEDICAL CARE IF:   You feel very sleepy.  You develop vomiting or diarrhea.  You have a general ill feeling (malaise) with muscle aches and pains. MAKE SURE YOU:   Understand these instructions.  Will watch your condition.  Will get help right away if you are not doing well or get  worse. Document Released: 02/09/2005 Document Revised: 09/16/2013 Document Reviewed: 07/18/2011 [facility] Patient Information 2015 [employer] [place]. This information is not intended to replace advice given to you by your health care provider. Make sure you discuss any questions you have with your health care provider.

## 2014-07-28 NOTE — ED Provider Notes (Signed)
CSN: [id number]     Arrival date & time 07/28/14  1621 History   First MD Initiated Contact with Patient 07/28/14 1827     Chief Complaint  Patient presents with  . Knee Pain     (Consider location/radiation/quality/duration/timing/severity/associated sxs/prior Treatment) HPI Comments: Patient is a 74 year old female with history of diabetes, hypertension, renal transplant. She presents for evaluation of right knee pain. She ran into an end table one week ago, causing a scrape/abrasion. There is now increased redness to the front of the knee and increased discomfort with walking. She denies any fevers or chills. She was seen at urgent care, then sent here for further workup for possible septic knee. She denies any fevers or chills.  Patient is a 74 y.o. female presenting with knee pain. The history is provided by the patient.  Knee Pain Location:  Knee Time since incident:  1 week Injury: yes   Knee location:  R knee Pain details:    Radiates to:  Does not radiate   Severity:  Moderate   Onset quality:  Sudden   Duration:  1 week   Timing:  Constant   Progression:  Worsening Chronicity:  New   Past Medical History  Diagnosis Date  . Diabetes mellitus   . Hypertension   . H/O kidney transplant 2004    right kidney transplant at [facility]  . Anemia   . Chronic kidney disease    Past Surgical History  Procedure Laterality Date  . Kidney transplant Right   . Kidney surgery      multiple  . Cholecystectomy    . Thyroidectomy      1/2 removed  . Back surgery     Family History  Problem Relation Age of Onset  . Diabetes Mother   . Hypertension Mother   . Heart disease Father   . Varicose Veins Father   . Hypertension Sister   . Cancer Daughter   . Hypertension Son    History  Substance Use Topics  . Smoking status: Never Smoker   . Smokeless tobacco: Never Used  . Alcohol Use: No   OB History    No data available     Review of Systems  All other systems  reviewed and are negative.     Allergies  Codeine; Elavil; Iodinated diagnostic agents; Lipitor; Morphine and related; Neurontin; Niacin and related; Norvasc; Talwin; and Ultram  Home Medications   Prior to Admission medications   Medication Sig Start Date End Date Taking? Authorizing Provider  albuterol-ipratropium (COMBIVENT) 18-103 MCG/ACT inhaler Inhale 2 puffs into the lungs every 4 (four) hours.    Historical Provider, MD  aspirin 81 MG tablet Take 81 mg by mouth daily.    Historical Provider, MD  calcium carbonate (TUMS EX) 750 MG chewable tablet Chew 1 tablet by mouth daily.    Historical Provider, MD  clonazePAM (KLONOPIN) 1 MG tablet Take 1 mg by mouth at bedtime.    Historical Provider, MD  cloNIDine (CATAPRES) 0.2 MG tablet Take 0.2 mg by mouth 2 (two) times daily.    Historical Provider, MD  docusate sodium (COLACE) 100 MG capsule Take 100 mg by mouth 2 (two) times daily.    Historical Provider, MD  esomeprazole (NEXIUM) 40 MG capsule Take 40 mg by mouth daily at 12 noon.    Historical Provider, MD  fluconazole (DIFLUCAN) 100 MG tablet Take 100 mg by mouth daily.    Historical Provider, MD  halobetasol (ULTRAVATE) 0.05 % cream Apply  topically 2 (two) times daily.    Historical Provider, MD  hyoscyamine (LEVSIN, ANASPAZ) 0.125 MG tablet Take 0.125 mg by mouth every 4 (four) hours as needed.    Historical Provider, MD  insulin glargine (LANTUS) 100 UNIT/ML injection Inject 10 Units into the skin at bedtime.    Historical Provider, MD  levothyroxine (SYNTHROID, LEVOTHROID) 75 MCG tablet Take 75 mcg by mouth daily before breakfast.    Historical Provider, MD  magnesium oxide (MAG-OX) 400 MG tablet Take 400 mg by mouth daily.    Historical Provider, MD  metoprolol (LOPRESSOR) 50 MG tablet Take 50 mg by mouth 2 (two) times daily.    Historical Provider, MD  mycophenolate (CELLCEPT) 250 MG capsule Take 250 mg by mouth 2 (two) times daily.    Historical Provider, MD  Probiotic Product  (PROBIOTIC DAILY PO) Take 1 tablet by mouth daily.    Historical Provider, MD  sodium bicarbonate 650 MG tablet Take 650 mg by mouth 2 (two) times daily.    Historical Provider, MD  tacrolimus (PROGRAF) 0.5 MG capsule Take 0.5 mg by mouth daily.    Historical Provider, MD  traMADol (ULTRAM) 50 MG tablet Take by mouth every 6 (six) hours as needed.    Historical Provider, MD   BP 140/66 mmHg  Pulse 72  Temp(Src) 98.2 F (36.8 C) (Oral)  Resp 18  Ht 5\' 3"  (1.6 m)  Wt 98 lb (44.453 kg)  BMI 17.36 kg/m2  SpO2 98% Physical Exam  Constitutional: She is oriented to person, place, and time. She appears well-developed and well-nourished. No distress.  HENT:  Head: Normocephalic and atraumatic.  Neck: Normal range of motion. Neck supple.  Musculoskeletal: Normal range of motion.  The left knee is noted to have a superficial abrasion with mild surrounding erythema overlying the patella. There is no joint effusion. There is some pain with range of motion, however none that is significant.  Neurological: She is alert and oriented to person, place, and time.  Skin: Skin is warm and dry. She is not diaphoretic.  Nursing note and vitals reviewed.   ED Course  Procedures (including critical care time) Labs Review Labs Reviewed - No data to display  Imaging Review No results found.   EKG Interpretation None      MDM   Final diagnoses:  None    Patient sent here for rule out of septic arthritis. Her exam is inconsistent with a septic knee. This appears to be more of a cellulitis to the prepatellar area. I am unable to appreciate any significant fluid within the prepatellar bursa and I suspect this is isolated to the skin. She will be treated with Keflex and when necessary return. Her x-rays here are unremarkable.    Geoffery Lyonsouglas Juandiego Kolenovic, MD 07/28/14 [id number]

## 2014-10-16 ENCOUNTER — Encounter (HOSPITAL_COMMUNITY): Payer: Self-pay

## 2014-10-16 ENCOUNTER — Other Ambulatory Visit (HOSPITAL_COMMUNITY): Payer: Self-pay

## 2014-10-16 ENCOUNTER — Inpatient Hospital Stay (HOSPITAL_COMMUNITY)
Admission: EM | Admit: 2014-10-16 | Discharge: 2014-10-21 | DRG: 871 | Disposition: A | Discharge status: Home / Self Care | Payer: Medicare Other | Attending: Internal Medicine | Admitting: Internal Medicine

## 2014-10-16 ENCOUNTER — Inpatient Hospital Stay (HOSPITAL_COMMUNITY): Payer: Medicare Other

## 2014-10-16 DIAGNOSIS — Z681 Body mass index (BMI) 19 or less, adult: Secondary | ICD-10-CM | POA: Diagnosis not present

## 2014-10-16 DIAGNOSIS — E875 Hyperkalemia: Secondary | ICD-10-CM

## 2014-10-16 DIAGNOSIS — R0789 Other chest pain: Secondary | ICD-10-CM | POA: Diagnosis not present

## 2014-10-16 DIAGNOSIS — Z888 Allergy status to other drugs, medicaments and biological substances status: Secondary | ICD-10-CM

## 2014-10-16 DIAGNOSIS — D649 Anemia, unspecified: Secondary | ICD-10-CM | POA: Diagnosis not present

## 2014-10-16 DIAGNOSIS — N390 Urinary tract infection, site not specified: Secondary | ICD-10-CM

## 2014-10-16 DIAGNOSIS — R112 Nausea with vomiting, unspecified: Secondary | ICD-10-CM | POA: Diagnosis not present

## 2014-10-16 DIAGNOSIS — D638 Anemia in other chronic diseases classified elsewhere: Secondary | ICD-10-CM | POA: Diagnosis present

## 2014-10-16 DIAGNOSIS — I4719 Other supraventricular tachycardia: Secondary | ICD-10-CM | POA: Clinically undetermined

## 2014-10-16 DIAGNOSIS — A419 Sepsis, unspecified organism: Secondary | ICD-10-CM | POA: Diagnosis present

## 2014-10-16 DIAGNOSIS — I6529 Occlusion and stenosis of unspecified carotid artery: Secondary | ICD-10-CM | POA: Diagnosis present

## 2014-10-16 DIAGNOSIS — L03314 Cellulitis of groin: Secondary | ICD-10-CM | POA: Diagnosis present

## 2014-10-16 DIAGNOSIS — K2100 Gastro-esophageal reflux disease with esophagitis, without bleeding: Secondary | ICD-10-CM

## 2014-10-16 DIAGNOSIS — L0390 Cellulitis, unspecified: Secondary | ICD-10-CM | POA: Insufficient documentation

## 2014-10-16 DIAGNOSIS — I471 Supraventricular tachycardia: Secondary | ICD-10-CM | POA: Diagnosis present

## 2014-10-16 DIAGNOSIS — R079 Chest pain, unspecified: Secondary | ICD-10-CM | POA: Diagnosis not present

## 2014-10-16 DIAGNOSIS — K219 Gastro-esophageal reflux disease without esophagitis: Secondary | ICD-10-CM | POA: Diagnosis present

## 2014-10-16 DIAGNOSIS — I120 Hypertensive chronic kidney disease with stage 5 chronic kidney disease or end stage renal disease: Secondary | ICD-10-CM | POA: Diagnosis present

## 2014-10-16 DIAGNOSIS — N179 Acute kidney failure, unspecified: Secondary | ICD-10-CM | POA: Diagnosis present

## 2014-10-16 DIAGNOSIS — Z833 Family history of diabetes mellitus: Secondary | ICD-10-CM | POA: Diagnosis not present

## 2014-10-16 DIAGNOSIS — Z885 Allergy status to narcotic agent status: Secondary | ICD-10-CM | POA: Diagnosis not present

## 2014-10-16 DIAGNOSIS — Z79899 Other long term (current) drug therapy: Secondary | ICD-10-CM

## 2014-10-16 DIAGNOSIS — N186 End stage renal disease: Secondary | ICD-10-CM | POA: Diagnosis present

## 2014-10-16 DIAGNOSIS — E876 Hypokalemia: Secondary | ICD-10-CM | POA: Diagnosis present

## 2014-10-16 DIAGNOSIS — I248 Other forms of acute ischemic heart disease: Secondary | ICD-10-CM | POA: Diagnosis present

## 2014-10-16 DIAGNOSIS — Z794 Long term (current) use of insulin: Secondary | ICD-10-CM

## 2014-10-16 DIAGNOSIS — I10 Essential (primary) hypertension: Secondary | ICD-10-CM | POA: Diagnosis present

## 2014-10-16 DIAGNOSIS — E039 Hypothyroidism, unspecified: Secondary | ICD-10-CM | POA: Diagnosis present

## 2014-10-16 DIAGNOSIS — E785 Hyperlipidemia, unspecified: Secondary | ICD-10-CM | POA: Diagnosis present

## 2014-10-16 DIAGNOSIS — Z940 Kidney transplant status: Secondary | ICD-10-CM | POA: Diagnosis not present

## 2014-10-16 DIAGNOSIS — N184 Chronic kidney disease, stage 4 (severe): Secondary | ICD-10-CM | POA: Diagnosis not present

## 2014-10-16 DIAGNOSIS — N189 Chronic kidney disease, unspecified: Secondary | ICD-10-CM | POA: Diagnosis not present

## 2014-10-16 DIAGNOSIS — Z8249 Family history of ischemic heart disease and other diseases of the circulatory system: Secondary | ICD-10-CM

## 2014-10-16 DIAGNOSIS — L089 Local infection of the skin and subcutaneous tissue, unspecified: Secondary | ICD-10-CM

## 2014-10-16 DIAGNOSIS — R0602 Shortness of breath: Secondary | ICD-10-CM

## 2014-10-16 DIAGNOSIS — I2489 Other forms of acute ischemic heart disease: Secondary | ICD-10-CM | POA: Clinically undetermined

## 2014-10-16 DIAGNOSIS — R110 Nausea: Secondary | ICD-10-CM

## 2014-10-16 DIAGNOSIS — E440 Moderate protein-calorie malnutrition: Secondary | ICD-10-CM | POA: Diagnosis present

## 2014-10-16 DIAGNOSIS — E119 Type 2 diabetes mellitus without complications: Secondary | ICD-10-CM | POA: Diagnosis present

## 2014-10-16 DIAGNOSIS — R7989 Other specified abnormal findings of blood chemistry: Secondary | ICD-10-CM | POA: Diagnosis not present

## 2014-10-16 DIAGNOSIS — Z7982 Long term (current) use of aspirin: Secondary | ICD-10-CM | POA: Diagnosis not present

## 2014-10-16 HISTORY — DX: Hyperlipidemia, unspecified: E78.5

## 2014-10-16 HISTORY — DX: Hypothyroidism, unspecified: E03.9

## 2014-10-16 HISTORY — DX: Unspecified osteoarthritis, unspecified site: M19.90

## 2014-10-16 HISTORY — DX: Type 2 diabetes mellitus without complications: E11.9

## 2014-10-16 HISTORY — DX: Occlusion and stenosis of unspecified carotid artery: I65.29

## 2014-10-16 HISTORY — DX: Personal history of other medical treatment: Z92.89

## 2014-10-16 HISTORY — DX: Urinary tract infection, site not specified: N39.0

## 2014-10-16 HISTORY — DX: Gastro-esophageal reflux disease without esophagitis: K21.9

## 2014-10-16 LAB — CBC WITH DIFFERENTIAL/PLATELET
Basophils Absolute: 0 10*3/uL (ref 0.0–0.1)
Basophils Relative: 0 % (ref 0–1)
Eosinophils Absolute: 0.1 10*3/uL (ref 0.0–0.7)
Eosinophils Relative: 3 % (ref 0–5)
HEMATOCRIT: 27.8 % — AB (ref 36.0–46.0)
HEMOGLOBIN: 9 g/dL — AB (ref 12.0–15.0)
Lymphocytes Relative: 20 % (ref 12–46)
Lymphs Abs: 0.9 10*3/uL (ref 0.7–4.0)
MCH: 28.7 pg (ref 26.0–34.0)
MCHC: 32.4 g/dL (ref 30.0–36.0)
MCV: 88.5 fL (ref 78.0–100.0)
MONO ABS: 0.4 10*3/uL (ref 0.1–1.0)
MONOS PCT: 8 % (ref 3–12)
NEUTROS PCT: 69 % (ref 43–77)
Neutro Abs: 3.2 10*3/uL (ref 1.7–7.7)
Platelets: 175 10*3/uL (ref 150–400)
RBC: 3.14 MIL/uL — AB (ref 3.87–5.11)
RDW: 15 % (ref 11.5–15.5)
WBC: 4.6 10*3/uL (ref 4.0–10.5)

## 2014-10-16 LAB — URINALYSIS, ROUTINE W REFLEX MICROSCOPIC
Bilirubin Urine: NEGATIVE
GLUCOSE, UA: 100 mg/dL — AB
Hgb urine dipstick: NEGATIVE
Ketones, ur: NEGATIVE mg/dL
LEUKOCYTES UA: NEGATIVE
Nitrite: NEGATIVE
PH: 5 (ref 5.0–8.0)
Protein, ur: NEGATIVE mg/dL
Specific Gravity, Urine: 1.009 (ref 1.005–1.030)
UROBILINOGEN UA: 0.2 mg/dL (ref 0.0–1.0)

## 2014-10-16 LAB — COMPREHENSIVE METABOLIC PANEL
ALBUMIN: 3.7 g/dL (ref 3.5–5.0)
ALK PHOS: 72 U/L (ref 38–126)
ALT: 11 U/L — ABNORMAL LOW (ref 14–54)
AST: 12 U/L — ABNORMAL LOW (ref 15–41)
Anion gap: 8 (ref 5–15)
BUN: 71 mg/dL — ABNORMAL HIGH (ref 6–20)
CALCIUM: 8.9 mg/dL (ref 8.9–10.3)
CO2: 16 mmol/L — ABNORMAL LOW (ref 22–32)
CREATININE: 2.79 mg/dL — AB (ref 0.44–1.00)
Chloride: 113 mmol/L — ABNORMAL HIGH (ref 101–111)
GFR calc Af Amer: 19 mL/min — ABNORMAL LOW (ref >60)
GFR, EST NON AFRICAN AMERICAN: 16 mL/min — AB (ref >60)
GLUCOSE: 103 mg/dL — AB (ref 65–99)
Potassium: 6 mmol/L — ABNORMAL HIGH (ref 3.5–5.1)
SODIUM: 137 mmol/L (ref 135–145)
Total Bilirubin: 0.1 mg/dL — ABNORMAL LOW (ref 0.3–1.2)
Total Protein: 6.6 g/dL (ref 6.5–8.1)

## 2014-10-16 LAB — PROTIME-INR
INR: 1.13 (ref 0.00–1.49)
Prothrombin Time: 14.7 seconds (ref 11.6–15.2)

## 2014-10-16 LAB — BASIC METABOLIC PANEL
Anion gap: 11 (ref 5–15)
BUN: 66 mg/dL — ABNORMAL HIGH (ref 6–20)
CALCIUM: 8.5 mg/dL — AB (ref 8.9–10.3)
CO2: 13 mmol/L — ABNORMAL LOW (ref 22–32)
Chloride: 113 mmol/L — ABNORMAL HIGH (ref 101–111)
Creatinine, Ser: 2.65 mg/dL — ABNORMAL HIGH (ref 0.44–1.00)
GFR calc Af Amer: 20 mL/min — ABNORMAL LOW (ref >60)
GFR calc non Af Amer: 17 mL/min — ABNORMAL LOW (ref >60)
Glucose, Bld: 127 mg/dL — ABNORMAL HIGH (ref 65–99)
POTASSIUM: 3.8 mmol/L (ref 3.5–5.1)
Sodium: 137 mmol/L (ref 135–145)

## 2014-10-16 LAB — TROPONIN I

## 2014-10-16 LAB — CBG MONITORING, ED: GLUCOSE-CAPILLARY: 116 mg/dL — AB (ref 65–99)

## 2014-10-16 LAB — PROCALCITONIN

## 2014-10-16 LAB — LACTIC ACID, PLASMA: Lactic Acid, Venous: 2.7 mmol/L (ref 0.5–2.0)

## 2014-10-16 LAB — POTASSIUM: Potassium: 3.9 mmol/L (ref 3.5–5.1)

## 2014-10-16 LAB — APTT: aPTT: 29 seconds (ref 24–37)

## 2014-10-16 MED ORDER — ONDANSETRON HCL 4 MG PO TABS: 4.0000 mg | ORAL_TABLET | Freq: Four times a day (QID) | ORAL | Status: DC | PRN

## 2014-10-16 MED ORDER — HEPARIN SODIUM (PORCINE) 5000 UNIT/ML IJ SOLN
5000.0000 [IU] | Freq: Three times a day (TID) | INTRAMUSCULAR | Status: DC
  Administered 2014-10-16 – 2014-10-19 (×8): 5000 [IU] via SUBCUTANEOUS
  Filled 2014-10-16 (×11): qty 1

## 2014-10-16 MED ORDER — SODIUM CHLORIDE 0.9 % IJ SOLN
3.0000 mL | Freq: Two times a day (BID) | INTRAMUSCULAR | Status: DC
  Administered 2014-10-18 – 2014-10-20 (×6): 3 mL via INTRAVENOUS

## 2014-10-16 MED ORDER — DEXTROSE 50 % IV SOLN
1.0000 | Freq: Once | INTRAVENOUS | Status: AC
  Administered 2014-10-16: 50 mL via INTRAVENOUS
  Filled 2014-10-16: qty 50

## 2014-10-16 MED ORDER — DOXYCYCLINE HYCLATE 100 MG PO CAPS: 100.0000 mg | ORAL_CAPSULE | Freq: Two times a day (BID) | ORAL | Status: DC

## 2014-10-16 MED ORDER — PIPERACILLIN-TAZOBACTAM IN DEX 2-0.25 GM/50ML IV SOLN
2.2500 g | Freq: Four times a day (QID) | INTRAVENOUS | Status: DC
  Administered 2014-10-17 – 2014-10-20 (×12): 2.25 g via INTRAVENOUS
  Filled 2014-10-16 (×18): qty 50

## 2014-10-16 MED ORDER — SODIUM CHLORIDE 0.9 % IV SOLN
500.0000 mg | INTRAVENOUS | Status: DC
  Administered 2014-10-18: 500 mg via INTRAVENOUS
  Filled 2014-10-16 (×2): qty 500

## 2014-10-16 MED ORDER — SODIUM CHLORIDE 0.9 % IV SOLN
INTRAVENOUS | Status: DC
  Administered 2014-10-16 – 2014-10-18 (×3): via INTRAVENOUS

## 2014-10-16 MED ORDER — SODIUM CHLORIDE 0.9 % IV BOLUS (SEPSIS)
1000.0000 mL | Freq: Once | INTRAVENOUS | Status: AC
  Administered 2014-10-16: 1000 mL via INTRAVENOUS

## 2014-10-16 MED ORDER — INSULIN ASPART 100 UNIT/ML IV SOLN
10.0000 [IU] | Freq: Once | INTRAVENOUS | Status: AC
  Administered 2014-10-16: 10 [IU] via INTRAVENOUS
  Filled 2014-10-16: qty 1

## 2014-10-16 MED ORDER — FAMOTIDINE IN NACL 20-0.9 MG/50ML-% IV SOLN
20.0000 mg | Freq: Once | INTRAVENOUS | Status: AC
  Administered 2014-10-16: 20 mg via INTRAVENOUS
  Filled 2014-10-16: qty 50

## 2014-10-16 MED ORDER — RISAQUAD PO CAPS
1.0000 | ORAL_CAPSULE | Freq: Every day | ORAL | Status: DC
  Administered 2014-10-16 – 2014-10-21 (×6): 1 via ORAL
  Filled 2014-10-16 (×6): qty 1

## 2014-10-16 MED ORDER — PANTOPRAZOLE SODIUM 40 MG PO TBEC
40.0000 mg | DELAYED_RELEASE_TABLET | Freq: Every day | ORAL | Status: DC
  Administered 2014-10-16 – 2014-10-21 (×6): 40 mg via ORAL
  Filled 2014-10-16 (×5): qty 1

## 2014-10-16 MED ORDER — ALBUTEROL SULFATE (2.5 MG/3ML) 0.083% IN NEBU
10.0000 mg | INHALATION_SOLUTION | Freq: Once | RESPIRATORY_TRACT | Status: AC
  Administered 2014-10-16: 10 mg via RESPIRATORY_TRACT
  Filled 2014-10-16: qty 12

## 2014-10-16 MED ORDER — ONDANSETRON HCL 4 MG/2ML IJ SOLN
4.0000 mg | Freq: Four times a day (QID) | INTRAMUSCULAR | Status: DC | PRN
  Administered 2014-10-16 – 2014-10-18 (×2): 4 mg via INTRAVENOUS
  Filled 2014-10-16 (×2): qty 2

## 2014-10-16 MED ORDER — HYOSCYAMINE SULFATE 0.125 MG PO TABS
0.1250 mg | ORAL_TABLET | ORAL | Status: DC | PRN
  Filled 2014-10-16: qty 1

## 2014-10-16 MED ORDER — VANCOMYCIN HCL IN DEXTROSE 1-5 GM/200ML-% IV SOLN
1000.0000 mg | Freq: Once | INTRAVENOUS | Status: AC
  Administered 2014-10-17: 1000 mg via INTRAVENOUS
  Filled 2014-10-16: qty 200

## 2014-10-16 MED ORDER — MYCOPHENOLATE MOFETIL 250 MG PO CAPS
250.0000 mg | ORAL_CAPSULE | Freq: Two times a day (BID) | ORAL | Status: DC
  Administered 2014-10-16 – 2014-10-21 (×10): 250 mg via ORAL
  Filled 2014-10-16 (×11): qty 1

## 2014-10-16 MED ORDER — ENSURE ENLIVE PO LIQD: 237.0000 mL | Freq: Two times a day (BID) | ORAL | Status: DC

## 2014-10-16 MED ORDER — TACROLIMUS 0.5 MG PO CAPS
0.5000 mg | ORAL_CAPSULE | Freq: Every day | ORAL | Status: DC
  Administered 2014-10-16 – 2014-10-20 (×5): 0.5 mg via ORAL
  Filled 2014-10-16 (×6): qty 1

## 2014-10-16 MED ORDER — ASPIRIN 81 MG PO CHEW
81.0000 mg | CHEWABLE_TABLET | Freq: Every day | ORAL | Status: DC
  Administered 2014-10-16 – 2014-10-20 (×5): 81 mg via ORAL
  Filled 2014-10-16 (×5): qty 1

## 2014-10-16 MED ORDER — PROBIOTIC DAILY PO CAPS: 1.0000 | ORAL_CAPSULE | Freq: Every day | ORAL | Status: DC

## 2014-10-16 MED ORDER — METOPROLOL TARTRATE 50 MG PO TABS
50.0000 mg | ORAL_TABLET | Freq: Two times a day (BID) | ORAL | Status: DC
  Administered 2014-10-16: 50 mg via ORAL
  Filled 2014-10-16 (×3): qty 1

## 2014-10-16 MED ORDER — FERROUS GLUCONATE 324 (38 FE) MG PO TABS
324.0000 mg | ORAL_TABLET | Freq: Every day | ORAL | Status: DC
  Filled 2014-10-16: qty 1

## 2014-10-16 MED ORDER — LEVOTHYROXINE SODIUM 75 MCG PO TABS
75.0000 ug | ORAL_TABLET | Freq: Every day | ORAL | Status: DC
  Administered 2014-10-17: 75 ug via ORAL
  Filled 2014-10-16 (×2): qty 1

## 2014-10-16 MED ORDER — TACROLIMUS 1 MG PO CAPS
1.0000 mg | ORAL_CAPSULE | Freq: Every day | ORAL | Status: DC
  Administered 2014-10-17 – 2014-10-21 (×5): 1 mg via ORAL
  Filled 2014-10-16 (×5): qty 1

## 2014-10-16 MED ORDER — TRAMADOL HCL 50 MG PO TABS
50.0000 mg | ORAL_TABLET | Freq: Four times a day (QID) | ORAL | Status: DC | PRN
  Administered 2014-10-16 – 2014-10-17 (×3): 50 mg via ORAL
  Filled 2014-10-16 (×3): qty 1

## 2014-10-16 MED ORDER — CLONAZEPAM 0.5 MG PO TABS
1.0000 mg | ORAL_TABLET | Freq: Every day | ORAL | Status: DC
  Administered 2014-10-16 – 2014-10-20 (×5): 1 mg via ORAL
  Filled 2014-10-16 (×5): qty 2

## 2014-10-16 MED ORDER — PIPERACILLIN-TAZOBACTAM 3.375 G IVPB 30 MIN
3.3750 g | Freq: Once | INTRAVENOUS | Status: AC
  Administered 2014-10-16: 3.375 g via INTRAVENOUS
  Filled 2014-10-16: qty 50

## 2014-10-16 MED ORDER — FLUCONAZOLE 100 MG PO TABS
100.0000 mg | ORAL_TABLET | Freq: Every day | ORAL | Status: DC
  Administered 2014-10-16 – 2014-10-21 (×6): 100 mg via ORAL
  Filled 2014-10-16 (×6): qty 1

## 2014-10-16 MED ORDER — SODIUM POLYSTYRENE SULFONATE 15 GM/60ML PO SUSP
15.0000 g | Freq: Once | ORAL | Status: AC
  Administered 2014-10-16: 15 g via ORAL
  Filled 2014-10-16: qty 60

## 2014-10-16 MED ORDER — HYDROCODONE-ACETAMINOPHEN 5-325 MG PO TABS
1.0000 | ORAL_TABLET | Freq: Four times a day (QID) | ORAL | Status: DC | PRN
  Administered 2014-10-17: 2 via ORAL
  Filled 2014-10-16: qty 2

## 2014-10-16 MED ORDER — DEXTROSE 5 % IV SOLN: 1.0000 g | Freq: Once | INTRAVENOUS | Status: DC

## 2014-10-16 MED ORDER — SODIUM CHLORIDE 0.9 % IV BOLUS (SEPSIS)
500.0000 mL | Freq: Once | INTRAVENOUS | Status: AC
  Administered 2014-10-16: 500 mL via INTRAVENOUS

## 2014-10-16 MED ORDER — ASPIRIN 81 MG PO TABS: 81.0000 mg | ORAL_TABLET | Freq: Every day | ORAL | Status: DC

## 2014-10-16 MED ORDER — INSULIN GLARGINE 100 UNIT/ML ~~LOC~~ SOLN
6.0000 [IU] | Freq: Every day | SUBCUTANEOUS | Status: DC
  Administered 2014-10-16: 6 [IU] via SUBCUTANEOUS
  Filled 2014-10-16 (×3): qty 0.06

## 2014-10-16 NOTE — H&P (Signed)
[facility] History and Physical  Deanna ShortsLinda T Gardella ZOX:[id number] DOB: Sep 25, 1940 DOA: 10/16/2014  Referring physician: ED physician PCP: Samuel JesterBUTLER, CYNTHIA, DO  Specialists:   Chief Complaint: infection over mons pubis  HPI: [name] is a 74 y.o. 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   Patient reports that she noticed a small boil over her right mons pubis 6 days ago. It become more tender and seems to have enlarged in size. She does not have a fever, chills. No injury to the area. She reports that she is being treated for UTI with cipro currently. She took cipro for two days so far, but still has burning on urination.   Currently patient denies fever, chills, running nose, ear pain, headaches, cough, chest pain, SOB, abdominal pain, diarrhea, constipation, dysuria, urgency, frequency, hematuria, joint pain or leg swelling. No unilateral weakness, numbness or tingling sensations. No vision change or hearing loss.  In ED, patient was found to have potassium 6.0, worsening renal function compared to creatinine data on 11/19/09. EKG has a T-wave peaking. WBC 4.6, temperature normal, tachycardia. Patient is admitted to inpatient for further evaluation and treatment.  Where does patient live?   At home    Can patient participate in ADLs?  Yes   Review of Systems:   General: no fevers, chills, no changes in body weight, has poor appetite, has fatigue HEENT: no blurry vision, hearing changes or sore throat Pulm: no dyspnea, coughing, wheezing CV: no chest pain, palpitations Abd: no nausea, vomiting, abdominal pain, diarrhea, constipation GU: no dysuria, burning on urination, increased urinary frequency, hematuria  Ext: no leg edema Neuro: no unilateral weakness, numbness, or tingling, no vision change or hearing  loss Skin: has infection over right mons of pubis MSK: No muscle spasm, no deformity, no limitation of range of movement in spin Heme: No easy bruising.  Travel history: No recent long distant travel.  Allergy:  Allergies  Allergen Reactions  . Codeine Nausea And Vomiting  . Elavil [Amitriptyline] Other (See Comments)    unknown  . Iodinated Diagnostic Agents      Renal transplant pt  . Lipitor [Atorvastatin]   . Magnesium Hives    Pt tolerated oral and IV during 06/2014 admission   . Metoclopramide     Other reaction(s): Other (See Comments) "doesn't work"  . Morphine     Other reaction(s): Confusion (intolerance)  . Morphine And Related   . Neurontin [Gabapentin]   . Niacin And Related   . Norvasc [Amlodipine Besylate]   . Talwin [Pentazocine]   . Ultram [Tramadol]     Past Medical History  Diagnosis Date  . Hypertension   . Anemia   . GERD (gastroesophageal reflux disease)   . Hypothyroidism   . Family history of adverse reaction to anesthesia     "my daughter; they thought she was asleep and she wasn't"  . Hyperlipemia   . Pneumonia "1-2 times"  . Type II diabetes mellitus   . History of blood transfusion "several"    "all related to anemia"  . Arthritis     "knees" (10/16/2014)  . Chronic kidney disease     h/o transplant  . Frequent UTI   . Carotid artery stenosis     Past Surgical History  Procedure Laterality Date  . Kidney [facility] Right 2004    [facility]  . Kidney surgery  multiple  . Thyroidectomy      1/2 removed  . Back surgery    . Tonsillectomy    . Appendectomy    . Cholecystectomy open    . Lumbar disc surgery      "L5"  . Abdominal hysterectomy    . Tubal ligation      Social History:  reports that she has never smoked. She has never used smokeless tobacco. She reports that she does not drink alcohol or use illicit drugs.  Family History:  Family History  Problem Relation Age of Onset  . Diabetes Mother   . Hypertension  Mother   . Heart disease Father   . Varicose Veins Father   . Hypertension Sister   . Cancer Daughter   . Hypertension Son      Prior to Admission medications   Medication Sig Start Date End Date Taking? Authorizing Provider  aspirin 81 MG tablet Take 81 mg by mouth at bedtime.    Yes Historical Provider, MD  ciprofloxacin (CIPRO) 250 MG tablet Take 250 mg by mouth 2 (two) times daily.  10/14/14  Yes Historical Provider, MD  clonazePAM (KLONOPIN) 1 MG tablet Take 1 mg by mouth at bedtime.   Yes Historical Provider, MD  ferrous gluconate (FERGON) 324 MG tablet Take 324 mg by mouth daily with breakfast.  07/04/14  Yes Historical Provider, MD  fluconazole (DIFLUCAN) 100 MG tablet Take 100 mg by mouth daily.   Yes Historical Provider, MD  insulin glargine (LANTUS) 100 UNIT/ML injection Inject 8 Units into the skin at bedtime.    Yes Historical Provider, MD  levothyroxine (SYNTHROID, LEVOTHROID) 75 MCG tablet Take 75 mcg by mouth daily before breakfast.   Yes Historical Provider, MD  metoprolol (LOPRESSOR) 50 MG tablet Take 50 mg by mouth 2 (two) times daily.   Yes Historical Provider, MD  mycophenolate (CELLCEPT) 250 MG capsule Take 250 mg by mouth 2 (two) times daily.   Yes Historical Provider, MD  pantoprazole (PROTONIX) 40 MG tablet Take 40 mg by mouth daily. 10/11/14  Yes Historical Provider, MD  Probiotic Product (PROBIOTIC DAILY PO) Take 1 tablet by mouth daily.   Yes Historical Provider, MD  tacrolimus (PROGRAF) 0.5 MG capsule Take 0.5 mg by mouth 2 (two) times daily. 1 mg in the morning and 0.5 mg nightly.   Yes Historical Provider, MD  traMADol (ULTRAM) 50 MG tablet Take 50 mg by mouth every 6 (six) hours as needed for moderate pain.    Yes Historical Provider, MD  albuterol-ipratropium (COMBIVENT) 18-103 MCG/ACT inhaler Inhale 2 puffs into the lungs every 4 (four) hours.    Historical Provider, MD  cephALEXin (KEFLEX) 500 MG capsule Take 1 capsule (500 mg total) by mouth 3 (three) times  daily. Patient not taking: Reported on 10/16/2014 07/28/14   Geoffery Lyons, MD  doxycycline (VIBRAMYCIN) 100 MG capsule Take 1 capsule (100 mg total) by mouth 2 (two) times daily. 10/16/14   Elson Areas, PA-C  fluconazole (DIFLUCAN) 200 MG tablet Take 200 mg by mouth daily. 10/13/14   Historical Provider, MD  halobetasol (ULTRAVATE) 0.05 % cream Apply 1 application topically 2 (two) times daily.     Historical Provider, MD  HYDROcodone-acetaminophen (NORCO) 5-325 MG per tablet Take 1-2 tablets by mouth every 6 (six) hours as needed. Patient not taking: Reported on 10/16/2014 07/28/14   Geoffery Lyons, MD  hyoscyamine (LEVSIN, ANASPAZ) 0.125 MG tablet Take 0.125 mg by mouth every 4 (four) hours as needed for cramping.  Historical Provider, MD  insulin glargine (LANTUS) 100 UNIT/ML injection HOLD until follow up with physician. Previous dose was 10 units nightly. 07/04/14   Historical Provider, MD  magnesium oxide (MAG-OX) 400 MG tablet Take 400 mg by mouth daily.    Historical Provider, MD  magnesium oxide (MAG-OX) 400 MG tablet Take 400 mg by mouth 2 (two) times daily.    Historical Provider, MD  metoprolol (LOPRESSOR) 50 MG tablet Take 50 mg by mouth 2 (two) times daily.    Historical Provider, MD  mycophenolate (CELLCEPT) 250 MG capsule Take 500 mg by mouth 2 (two) times daily.  07/04/14   Historical Provider, MD  Probiotic Product (PROBIOTIC DAILY PO) Take 1 tablet by mouth daily.    Historical Provider, MD  sodium bicarbonate 650 MG tablet Take 650 mg by mouth 2 (two) times daily.    Historical Provider, MD    Physical Exam: Filed Vitals:   10/16/14 2140 10/16/14 2331 10/17/14 0337 10/17/14 0709  BP: 137/58 136/59 90/50 86/46   Pulse: 111 106 96 87  Temp: 97.4 F (36.3 C)   98.3 F (36.8 C)  TempSrc: Oral   Oral  Resp: 20   18  Height: 5\' 3"  (1.6 m)     Weight: 44.044 kg (97 lb 1.6 oz)   44 kg (97 lb)  SpO2: 100% 100%  100%   General: Not in acute distress HEENT:       Eyes: PERRL, EOMI,  no scleral icterus.       ENT: No discharge from the ears and nose, no pharynx injection, no tonsillar enlargement.        Neck: No JVD, no bruit, no mass felt. Heme: No neck lymph node enlargement. Cardiac: S1/S2, RRR, No murmurs, No gallops or rubs. Pulm: Good air movement bilaterally. No rales, wheezing, rhonchi or rubs. Abd: Soft, nondistended, nontender, no rebound pain, no organomegaly, BS present. Ext: No pitting leg edema bilaterally. 2+DP/PT pulse bilaterally. Musculoskeletal: No joint deformities, No joint redness or warmth, no limitation of ROM in spin. Skin: there is a small skin lesion over right mons of pubis, with erythematous, tenderness and surrounding induration, approximately 2 cm in size. Neuro: Alert, oriented X3, cranial nerves II-XII grossly intact, muscle strength 5/5 in all extremities, sensation to light touch intact.  Psych: Patient is not psychotic, no suicidal or hemocidal ideation.  Labs on Admission:  Basic Metabolic Panel:  Recent Labs Lab 10/16/14 1635 10/16/14 2035 10/17/14 0526  NA 137 137 140  K 6.0* 3.9  3.8 5.6*  CL 113* 113* 117*  CO2 16* 13* 18*  GLUCOSE 103* 127* 81  BUN 71* 66* 50*  CREATININE 2.79* 2.65* 2.19*  CALCIUM 8.9 8.5* 7.6*   Liver Function Tests:  Recent Labs Lab 10/16/14 1635  AST 12*  ALT 11*  ALKPHOS 72  BILITOT 0.1*  PROT 6.6  ALBUMIN 3.7   No results for input(s): LIPASE, AMYLASE in the last 168 hours. No results for input(s): AMMONIA in the last 168 hours. CBC:  Recent Labs Lab 10/16/14 1635 10/17/14 0526  WBC 4.6 4.1  NEUTROABS 3.2  --   HGB 9.0* 6.9*  HCT 27.8* 21.0*  MCV 88.5 87.9  PLT 175 137*   Cardiac Enzymes:  Recent Labs Lab 10/16/14 2035  TROPONINI <0.03    BNP (last 3 results) No results for input(s): BNP in the last 8760 hours.  ProBNP (last 3 results) No results for input(s): PROBNP in the last 8760 hours.  CBG:  Recent  Labs Lab 10/16/14 2113 10/17/14 0549 10/17/14 0607  10/17/14 0632  GLUCAP 116* 57* 54* 166*    Radiological Exams on Admission: Dg Chest Port 1 View  10/16/2014   CLINICAL DATA:  Abscess.  UTI for 3 days.  EXAM: PORTABLE CHEST - 1 VIEW  COMPARISON:  None.  FINDINGS: The heart size and mediastinal contours are within normal limits. Both lungs are clear. The visualized skeletal structures are unremarkable.  IMPRESSION: No active disease.   Electronically Signed   By: Signa Kell M.D.   On: 10/16/2014 21:09    EKG: Independently reviewed.  Abnormal findings: T-wave peaking in lead 2, V4 and V5   Assessment/Plan Principal Problem:   Cellulitis of right groin Active Problems:   Anemia   Essential hypertension   GERD (gastroesophageal reflux disease)   Hypothyroidism   UTI (lower urinary tract infection)   Carotid artery stenosis  Cellulitis over right mons of pubis and sepsis: there is a small skin lesion over right mons of pubis, with erythematous, tenderness and surrounding induration, consistent with cellulitis. Pt is septic on admission, with tachycardia and elevated lactate. Given her immunosuppression, will need abx with broad coverage.   -will admit tele bed. -start IV vancomycin and Zosyn -will get Procalcitonin and trend lactic acid level  -IVF: received 2.5L of NS bolus in ED, followed by 100 cc/h -pain control: norco prn - blood culture x 2  Hx of any transplantation and CKD-III: Previous creatinine was 1.81 on 11/19/09, her creatinine is 2.79 on admission, not sure how acutely the worsening renal function happened.  -will check FeNA -check US-renal -continue Cellcept and Tacrolimus -check tacrolimus level  Hypokalemia: k=6.0 with T wave change -treated with NovoLog 10 units, D50 and 15 g of the Kayexalate emergency room. -the repeated BMP on 20:35 showed K=3.8, but comes back to 5.6 in AM. Will give another dose of Kayexalate 15 g, check stat EKG.  Hypothyroidism: Last TSH was 2.897 on 04/26/09 -Continue home  Synthroid -Check TSH  UTI: was on cipro -on IV vancomycin and zosyn now -follow up urine culture  DM-II: No A1c on record. Patient is taking Lantus at home -will decrease Lantus dose from 8 units to 6 units daily -SSI -Check A1c  GERD: -Protonix  Hypertension: -Continue metoprolol  Carotid artery stenosis: Carotid artery Doppler showed right side 60 to 79% of stenosis on 06/16/14. -continue ASA  -check FLP   DVT ppx: SQ Heparin    Code Status: Full code Family Communication: Yes, patient's  sister  at bed side Disposition Plan: Admit to inpatient   Date of Service 10/17/2014    Lorretta Harp [facility] Pager [phone]  If 7PM-7AM, please contact night-coverage [email or web address] Password [facility] 10/17/2014, 7:44 AM

## 2014-10-16 NOTE — ED Notes (Cosign Needed)
Pt developed dysuria 4 days ago, she sent a urine sample to her kidney doctor 3 days ago and was diagnosed with UTI, growing E.coli on urine culture and was started on Cipro which she took for the past 2 days.  Pt also developed skin infection to mon pubis 6 days ago, painful and red, which prompted her to come to ER.  Otherwise, no fever, cp, sob, back pain.    Baseline renal function is cr 2.1.  Hx of kidney transplant 12 years ago.    6:14 PM Patient has elevated potassium of 6.0 with peak T waves on EKG. Renal function is abnormal with BUN in the 70s and creatinine 2.71. This is markedly elevated from her baseline. At this time she has no chest pain or short of breath. I discussed with Dr. Clarice Pole in regards to her EKG changes and hyperkalemia. We have initiated promptly treatment including Kayexalate, insulin, glucose, albuterol, IV fluid, and bicarbonate to help reduce her potassium load. She will need to be monitored closely and subsequently admitted for further care. Patient voiced understanding and agrees with plan.  CRITICAL CARE Performed by: Fayrene Helper Total critical care time: 30 min Critical care time was exclusive of separately billable procedures and treating other patients. Critical care was necessary to treat or prevent imminent or life-threatening deterioration. Critical care was time spent personally by me on the following activities: development of treatment plan with patient and/or surrogate as well as nursing, discussions with consultants, evaluation of patient's response to treatment, examination of patient, obtaining history from patient or surrogate, ordering and performing treatments and interventions, ordering and review of laboratory studies, ordering and review of radiographic studies, pulse oximetry and re-evaluation of patient's condition.  BP 102/56 mmHg  Pulse 74  Temp(Src) 97.9 F (36.6 C) (Oral)  Resp 20  Ht  (1.6 m)  Wt 98 lb 12.8 oz (44.815 kg)  BMI  17.51 kg/m2  SpO2 95%  I have reviewed nursing notes and vital signs. I personally reviewed the imaging tests through PACS system  I reviewed available ER/hospitalization records thought the EMR    Results for orders placed or performed during the hospital encounter of 10/16/14  CBC with Differential/Platelet  Result Value Ref Range   WBC 4.6 4.0 - 10.5 K/uL   RBC 3.14 (L) 3.87 - 5.11 MIL/uL   Hemoglobin 9.0 (L) 12.0 - 15.0 g/dL   HCT 16.1 (L) 09.6 - 04.5 %   MCV 88.5 78.0 - 100.0 fL   MCH 28.7 26.0 - 34.0 pg   MCHC 32.4 30.0 - 36.0 g/dL   RDW 40.9 81.1 - 91.4 %   Platelets 175 150 - 400 K/uL   Neutrophils Relative % 69 43 - 77 %   Neutro Abs 3.2 1.7 - 7.7 K/uL   Lymphocytes Relative 20 12 - 46 %   Lymphs Abs 0.9 0.7 - 4.0 K/uL   Monocytes Relative 8 3 - 12 %   Monocytes Absolute 0.4 0.1 - 1.0 K/uL   Eosinophils Relative 3 0 - 5 %   Eosinophils Absolute 0.1 0.0 - 0.7 K/uL   Basophils Relative 0 0 - 1 %   Basophils Absolute 0.0 0.0 - 0.1 K/uL  Comprehensive metabolic panel  Result Value Ref Range   Sodium 137 135 - 145 mmol/L   Potassium 6.0 (H) 3.5 - 5.1 mmol/L   Chloride 113 (H) 101 - 111 mmol/L   CO2 16 (L) 22 - 32 mmol/L   Glucose, Bld 103 (H) 65 -  99 mg/dL   BUN 71 (H) 6 - 20 mg/dL   Creatinine, Ser 6.962.79 (H) 0.44 - 1.00 mg/dL   Calcium 8.9 8.9 - 29.510.3 mg/dL   Total Protein 6.6 6.5 - 8.1 g/dL   Albumin 3.7 3.5 - 5.0 g/dL   AST 12 (L) 15 - 41 U/L   ALT 11 (L) 14 - 54 U/L   Alkaline Phosphatase 72 38 - 126 U/L   Total Bilirubin 0.1 (L) 0.3 - 1.2 mg/dL   GFR calc non Af Amer 16 (L) >60 mL/min   GFR calc Af Amer 19 (L) >60 mL/min   Anion gap 8 5 - 15   No results found.     Fayrene HelperBowie Elaynah Virginia, PA-C 10/16/14 1816

## 2014-10-16 NOTE — Progress Notes (Signed)
Received pt report from Hannah,RN-ED. 

## 2014-10-16 NOTE — Progress Notes (Signed)
CRITICAL VALUE ALERT  Critical value received:  Lactic acid 2.7  Date of notification:  10/16/2014  Time of notification:  9:46 PM   Critical value read back:Yes.    Nurse who received alert:  Herminio Headsoliza Clovia Reine  MD notified (1st page):  Kirby,NP  Time of first page:  [phone]  MD notified (2nd page):  Time of second page:  Responding MD:  Odessa FlemingKirby,NP  Time MD responded:  [id number]

## 2014-10-16 NOTE — ED Notes (Signed)
Phlebotomy at the bedside  

## 2014-10-16 NOTE — ED Notes (Signed)
Pt began vomiting, reports nausea since drinking kayexalate.

## 2014-10-16 NOTE — ED Notes (Signed)
Pt noticed a red bump on Saturday in her groin area and then another one popped up beside it yesterday and it is draining. Pt is a kidney transplant pt and wants to make sure she doesn't have a bad infection. They are sore and has been keeping them covered.

## 2014-10-16 NOTE — ED Notes (Signed)
MD Pfeiffer at the bedside 

## 2014-10-16 NOTE — ED Notes (Signed)
Attempted Report x1.   

## 2014-10-16 NOTE — ED Provider Notes (Signed)
CSN: [id number]     Arrival date & time 10/16/14  1446 History   First MD Initiated Contact with Patient 10/16/14 1527     Chief Complaint  Patient presents with  . Abscess     (Consider location/radiation/quality/duration/timing/severity/associated sxs/prior Treatment) HPI Patient's evaluation have been initiated in fast track. She was evaluated for 2 areas of possible abscess on the mons pubis. Due to the patient's comorbid medical conditions and 40 diagnosis of UTI on ciprofloxacin, the evaluating PA-C ordered diagnostic studies. Labs returned with acute kidney injury and hyperkalemia. At that time I assumed care for the patient. Review of systems was actually negative for fever, general malaise, abdominal pain, back pain, chest pain, cough. Past Medical History  Diagnosis Date  . Diabetes mellitus   . Hypertension   . H/O kidney transplant 2004    right kidney transplant at [facility]  . Anemia   . Chronic kidney disease   . GERD (gastroesophageal reflux disease)   . Hypothyroidism    Past Surgical History  Procedure Laterality Date  . Kidney transplant Right   . Kidney surgery      multiple  . Cholecystectomy    . Thyroidectomy      1/2 removed  . Back surgery     Family History  Problem Relation Age of Onset  . Diabetes Mother   . Hypertension Mother   . Heart disease Father   . Varicose Veins Father   . Hypertension Sister   . Cancer Daughter   . Hypertension Son    History  Substance Use Topics  . Smoking status: Never Smoker   . Smokeless tobacco: Never Used  . Alcohol Use: No   OB History    No data available     Review of Systems 10 Systems reviewed and are negative for acute change except as noted in the HPI.    Allergies  Codeine; Elavil; Iodinated diagnostic agents; Lipitor; Magnesium; Metoclopramide; Morphine; Morphine and related; Neurontin; Niacin and related; Norvasc; Talwin; and Ultram  Home Medications   Prior to Admission medications     Medication Sig Start Date End Date Taking? Authorizing Provider  aspirin 81 MG tablet Take 81 mg by mouth at bedtime.    Yes Historical Provider, MD  ciprofloxacin (CIPRO) 250 MG tablet Take 250 mg by mouth 2 (two) times daily.  10/14/14  Yes Historical Provider, MD  clonazePAM (KLONOPIN) 1 MG tablet Take 1 mg by mouth at bedtime.   Yes Historical Provider, MD  ferrous gluconate (FERGON) 324 MG tablet Take 324 mg by mouth daily with breakfast.  07/04/14  Yes Historical Provider, MD  fluconazole (DIFLUCAN) 100 MG tablet Take 100 mg by mouth daily.   Yes Historical Provider, MD  insulin glargine (LANTUS) 100 UNIT/ML injection Inject 8 Units into the skin at bedtime.    Yes Historical Provider, MD  levothyroxine (SYNTHROID, LEVOTHROID) 75 MCG tablet Take 75 mcg by mouth daily before breakfast.   Yes Historical Provider, MD  metoprolol (LOPRESSOR) 50 MG tablet Take 50 mg by mouth 2 (two) times daily.   Yes Historical Provider, MD  mycophenolate (CELLCEPT) 250 MG capsule Take 250 mg by mouth 2 (two) times daily.   Yes Historical Provider, MD  pantoprazole (PROTONIX) 40 MG tablet Take 40 mg by mouth daily. 10/11/14  Yes Historical Provider, MD  Probiotic Product (PROBIOTIC DAILY PO) Take 1 tablet by mouth daily.   Yes Historical Provider, MD  tacrolimus (PROGRAF) 0.5 MG capsule Take 0.5 mg by mouth  2 (two) times daily. 1 mg in the morning and 0.5 mg nightly.   Yes Historical Provider, MD  traMADol (ULTRAM) 50 MG tablet Take 50 mg by mouth every 6 (six) hours as needed for moderate pain.    Yes Historical Provider, MD  cephALEXin (KEFLEX) 500 MG capsule Take 1 capsule (500 mg total) by mouth 3 (three) times daily. Patient not taking: Reported on 10/16/2014 07/28/14   Geoffery Lyons, MD  doxycycline (VIBRAMYCIN) 100 MG capsule Take 1 capsule (100 mg total) by mouth 2 (two) times daily. 10/16/14   Elson Areas, PA-C  HYDROcodone-acetaminophen (NORCO) 5-325 MG per tablet Take 1-2 tablets by mouth every 6 (six)  hours as needed. Patient not taking: Reported on 10/16/2014 07/28/14   Geoffery Lyons, MD  hyoscyamine (LEVSIN, ANASPAZ) 0.125 MG tablet Take 0.125 mg by mouth every 4 (four) hours as needed for cramping.     Historical Provider, MD   BP 145/58 mmHg  Pulse 124  Temp(Src) 97.9 F (36.6 C) (Oral)  Resp 16  Ht  (1.6 m)  Wt 98 lb 12.8 oz (44.815 kg)  BMI 17.51 kg/m2  SpO2 100% Physical Exam  Constitutional: She is oriented to person, place, and time. She appears well-developed and well-nourished.  HENT:  Head: Normocephalic and atraumatic.  Eyes: EOM are normal. Pupils are equal, round, and reactive to light.  Neck: Neck supple.  Cardiovascular: Normal rate, regular rhythm, normal heart sounds and intact distal pulses.   Pulmonary/Chest: Effort normal and breath sounds normal.  Abdominal: Soft. Bowel sounds are normal. She exhibits no distension. There is no tenderness.  Musculoskeletal: Normal range of motion. She exhibits no edema or tenderness.  Neurological: She is alert and oriented to person, place, and time. She has normal strength. Coordination normal. GCS eye subscore is 4. GCS verbal subscore is 5. GCS motor subscore is 6.  Skin: Skin is warm, dry and intact.  Psychiatric: She has a normal mood and affect.      ED Course  Procedures (including critical care time) Labs Review Labs Reviewed  CBC WITH DIFFERENTIAL/PLATELET - Abnormal; Notable for the following:    RBC 3.14 (*)    Hemoglobin 9.0 (*)    HCT 27.8 (*)    All other components within normal limits  COMPREHENSIVE METABOLIC PANEL - Abnormal; Notable for the following:    Potassium 6.0 (*)    Chloride 113 (*)    CO2 16 (*)    Glucose, Bld 103 (*)    BUN 71 (*)    Creatinine, Ser 2.79 (*)    AST 12 (*)    ALT 11 (*)    Total Bilirubin 0.1 (*)    GFR calc non Af Amer 16 (*)    GFR calc Af Amer 19 (*)    All other components within normal limits  URINE CULTURE  CULTURE, BLOOD (ROUTINE X 2)  CULTURE, BLOOD  (ROUTINE X 2)  URINALYSIS, ROUTINE W REFLEX MICROSCOPIC (NOT AT [facility])  GLUCOSE, RANDOM  TROPONIN I  POTASSIUM  CBG MONITORING, ED    Imaging Review No results found.   EKG Interpretation   Date/Time:  Thursday October 16 2014 17:37:43 EDT Ventricular Rate:  78 PR Interval:  128 QRS Duration: 78 QT Interval:  392 QTC Calculation: 446 R Axis:   63 Text Interpretation:  Normal sinus rhythm Normal ECG peaked t waves.  abnormal Confirmed by Donnald Garre, MD, Lebron Conners [id number]) on 10/16/2014 6:09:30 PM     CRITICAL CARE Performed by:  Ahlia Lemanski   Total critical care time: 6745  Critical care time was exclusive of separately billable procedures and treating other patients.  Critical care was necessary to treat or prevent imminent or life-threatening deterioration.  Critical care was time spent personally by me on the following activities: development of treatment plan with patient and/or surrogate as well as nursing, discussions with consultants, evaluation of patient's response to treatment, examination of patient, obtaining history from patient or surrogate, ordering and performing treatments and interventions, ordering and review of laboratory studies, ordering and review of radiographic studies, pulse oximetry and re-evaluation of patient's condition. MDM   Final diagnoses:  Skin infection  UTI (lower urinary tract infection)  Acute kidney injury  History of kidney transplant  Hyperkalemia   Treatment was initiated for hyperkalemia. Patient had a DuoNeb and insulin dextrose combination. After receiving those treatments she complained of thoracic back pain. She also had Kayexalate as well. The thoracic back pain was of new onset just since initiating treatment in the emergency department. EKG is obtained and has a tachycardia with diffuse ST depression which is most probably rate related. The patient has been admitted for hyperkalemia with tree of renal transplant and acute kidney  injury. At this time due to her complaint of thoracic pain, the troponin has been added and chest x-ray. Patient has not become hypertensive nor hypoxic. There is suspicion of GI discomfort with this given the fact that it initiated after treatment with medications. Pepcid will be initiated as we continue observation and evaluation for the new complaint of thoracic pain in addition to the identified hyperkalemia and cellulitis on the patient's mons pubis.    Arby BarretteMarcy Jewelz Kobus, MD 10/16/14 2033

## 2014-10-16 NOTE — Progress Notes (Signed)
ANTIBIOTIC CONSULT NOTE - INITIAL  Pharmacy Consult for vancomycin + zosyn Indication: cellulitis  Allergies  Allergen Reactions  . Codeine Nausea And Vomiting  . Elavil [Amitriptyline] Other (See Comments)    unknown  . Iodinated Diagnostic Agents      Renal transplant pt  . Lipitor [Atorvastatin]   . Magnesium Hives    Pt tolerated oral and IV during 06/2014 admission   . Metoclopramide     Other reaction(s): Other (See Comments) "doesn't work"  . Morphine     Other reaction(s): Confusion (intolerance)  . Morphine And Related   . Neurontin [Gabapentin]   . Niacin And Related   . Norvasc [Amlodipine Besylate]   . Talwin [Pentazocine]   . Ultram [Tramadol]     Patient Measurements: Height: 5\' 3"  (160 cm) Weight: 98 lb 12.8 oz (44.815 kg) IBW/kg (Calculated) : 52.4 Adjusted Body Weight:   Vital Signs: Temp: 97.9 F (36.6 C) (06/02 1459) Temp Source: Oral (06/02 1459) BP: 145/58 mmHg (06/02 2000) Pulse Rate: 124 (06/02 2000) Intake/Output from previous day:   Intake/Output from this shift:    Labs:  Recent Labs  10/16/14 1635  WBC 4.6  HGB 9.0*  PLT 175  CREATININE 2.79*   Estimated Creatinine Clearance: 13.1 mL/min (by C-G formula based on Cr of 2.79). No results for input(s): VANCOTROUGH, VANCOPEAK, VANCORANDOM, GENTTROUGH, GENTPEAK, GENTRANDOM, TOBRATROUGH, TOBRAPEAK, TOBRARND, AMIKACINPEAK, AMIKACINTROU, AMIKACIN in the last 72 hours.   Microbiology: No results found for this or any previous visit (from the past 720 hour(s)).  Medical History: Past Medical History  Diagnosis Date  . Diabetes mellitus   . Hypertension   . H/O kidney transplant 2004    [facility]  . Anemia   . Chronic kidney disease   . GERD (gastroesophageal reflux disease)   . Hypothyroidism     Medications:  Anti-infectives    Start     Dose/Rate Route Frequency Ordered Stop   10/18/14 2100  vancomycin (VANCOCIN) 500 mg in sodium chloride 0.9 % 100  mL IVPB     500 mg 100 mL/hr over 60 Minutes Intravenous Every 48 hours 10/16/14 2027     10/17/14 0300  piperacillin-tazobactam (ZOSYN) IVPB 2.25 g     2.25 g 100 mL/hr over 30 Minutes Intravenous Every 6 hours 10/16/14 2027     10/16/14 2030  vancomycin (VANCOCIN) IVPB 1000 mg/200 mL premix     1,000 mg 200 mL/hr over 60 Minutes Intravenous  Once 10/16/14 2027     10/16/14 2030  piperacillin-tazobactam (ZOSYN) IVPB 3.375 g     3.375 g 100 mL/hr over 30 Minutes Intravenous  Once 10/16/14 2027     10/16/14 1915  cefTRIAXone (ROCEPHIN) 1 g in dextrose 5 % 50 mL IVPB  Status:  Discontinued     1 g 100 mL/hr over 30 Minutes Intravenous  Once 10/16/14 1910 10/16/14 1954   10/16/14 0000  doxycycline (VIBRAMYCIN) 100 MG capsule     100 mg Oral 2 times daily 10/16/14 1652       Assessment: 73 yof presented to the ED with cellulitis. Pt is afebrile and WBC is WNL. Scr is elevated and pt has a history of kidney transplant.   Vanc 6/2>> Zosyn 6/2>>  Goal of Therapy:  Vancomycin trough level 10-15 mcg/ml  Plan:  - Zosyn 3.375gm IV x 1 then 2.25gm IV Q6H - Vanc 1gm IV x 1 then 500mg  IV Q48H - F/u renal fxn, C&S, clinical status and trough  at [facility], Drake Leach 10/16/2014,8:27 PM

## 2014-10-16 NOTE — ED Notes (Signed)
Patient being transported upstairs by Sharia ReeveJosh, EMT.

## 2014-10-16 NOTE — ED Provider Notes (Signed)
CSN: [id number]     Arrival date & time 10/16/14  1446 History  This chart was scribed for non-physician practitioner, Lonia SkinnerLeslie K. Keenan BachelorSofia, PA-C working with Jerelyn ScottMartha Linker, MD by Doreatha MartinEva Mathews, ED scribe. This patient was seen in room TR01C/TR01C and the patient's care was started at 3:30 PM    Chief Complaint  Patient presents with  . Abscess   The history is provided by the patient. No language interpreter was used.    HPI Comments: [name] is a 74 y.o. female with Hx of kidney transplant (12 years ago) who presents to the Emergency Department complaining of two areas of pain, redness and swelling to the medial groin onset 6 days PTA. She reports that one of the bumps is draining. Pt states that she has kept the wounds covered since discovery. Per pt, she currently has a kidney infection from E. Coli that is being treated with Cipro. She is allergic to morphine, Elavil, Lipitor, Neurotonin, niacin, Norva What Cheer, Talwin, Ultram, and Codeine. She denies smoking and drinking.   Past Medical History  Diagnosis Date  . Diabetes mellitus   . Hypertension   . H/O kidney transplant 2004    [facility]  . Anemia   . Chronic kidney disease    Past Surgical History  Procedure Laterality Date  . Kidney transplant Right   . Kidney surgery      multiple  . Cholecystectomy    . Thyroidectomy      1/2 removed  . Back surgery     Family History  Problem Relation Age of Onset  . Diabetes Mother   . Hypertension Mother   . Heart disease Father   . Varicose Veins Father   . Hypertension Sister   . Cancer Daughter   . Hypertension Son    History  Substance Use Topics  . Smoking status: Never Smoker   . Smokeless tobacco: Never Used  . Alcohol Use: No   OB History    No data available     Review of Systems  Skin: Positive for wound.  All other systems reviewed and are negative.  Allergies  Codeine; Elavil; Iodinated diagnostic agents; Lipitor; Morphine and  related; Neurontin; Niacin and related; Norvasc; Talwin; and Ultram  Home Medications   Prior to Admission medications   Medication Sig Start Date End Date Taking? Authorizing Provider  albuterol-ipratropium (COMBIVENT) 18-103 MCG/ACT inhaler Inhale 2 puffs into the lungs every 4 (four) hours.    Historical Provider, MD  aspirin 81 MG tablet Take 81 mg by mouth daily.    Historical Provider, MD  calcium carbonate (TUMS EX) 750 MG chewable tablet Chew 1 tablet by mouth daily.    Historical Provider, MD  cephALEXin (KEFLEX) 500 MG capsule Take 1 capsule (500 mg total) by mouth 3 (three) times daily. 07/28/14   Geoffery Lyonsouglas Delo, MD  clonazePAM (KLONOPIN) 1 MG tablet Take 1 mg by mouth at bedtime.    Historical Provider, MD  cloNIDine (CATAPRES) 0.2 MG tablet Take 0.2 mg by mouth 2 (two) times daily.    Historical Provider, MD  docusate sodium (COLACE) 100 MG capsule Take 100 mg by mouth 2 (two) times daily.    Historical Provider, MD  esomeprazole (NEXIUM) 40 MG capsule Take 40 mg by mouth daily at 12 noon.    Historical Provider, MD  fluconazole (DIFLUCAN) 100 MG tablet Take 100 mg by mouth daily.    Historical Provider, MD  halobetasol (ULTRAVATE) 0.05 % cream  Apply topically 2 (two) times daily.    Historical Provider, MD  HYDROcodone-acetaminophen (NORCO) 5-325 MG per tablet Take 1-2 tablets by mouth every 6 (six) hours as needed. 07/28/14   Geoffery Lyons, MD  hyoscyamine (LEVSIN, ANASPAZ) 0.125 MG tablet Take 0.125 mg by mouth every 4 (four) hours as needed.    Historical Provider, MD  insulin glargine (LANTUS) 100 UNIT/ML injection Inject 10 Units into the skin at bedtime.    Historical Provider, MD  levothyroxine (SYNTHROID, LEVOTHROID) 75 MCG tablet Take 75 mcg by mouth daily before breakfast.    Historical Provider, MD  magnesium oxide (MAG-OX) 400 MG tablet Take 400 mg by mouth daily.    Historical Provider, MD  metoprolol (LOPRESSOR) 50 MG tablet Take 50 mg by mouth 2 (two) times daily.     Historical Provider, MD  mycophenolate (CELLCEPT) 250 MG capsule Take 250 mg by mouth 2 (two) times daily.    Historical Provider, MD  Probiotic Product (PROBIOTIC DAILY PO) Take 1 tablet by mouth daily.    Historical Provider, MD  sodium bicarbonate 650 MG tablet Take 650 mg by mouth 2 (two) times daily.    Historical Provider, MD  tacrolimus (PROGRAF) 0.5 MG capsule Take 0.5 mg by mouth daily.    Historical Provider, MD  traMADol (ULTRAM) 50 MG tablet Take by mouth every 6 (six) hours as needed.    Historical Provider, MD   Triage VS: BP 102/56 mmHg  Pulse 74  Temp(Src) 97.9 F (36.6 C) (Oral)  Resp 20  Ht  (1.6 m)  Wt 98 lb 12.8 oz (44.815 kg)  BMI 17.51 kg/m2  SpO2 95% Physical Exam  Constitutional: She is oriented to person, place, and time. She appears well-developed and well-nourished. No distress.  HENT:  Head: Normocephalic and atraumatic.  Mouth/Throat: Oropharynx is clear and moist.  Eyes: Conjunctivae and EOM are normal. Pupils are equal, round, and reactive to light.  Neck: Normal range of motion. Neck supple. No tracheal deviation present.  Cardiovascular: Normal rate and normal heart sounds.  Exam reveals no gallop and no friction rub.   No murmur heard. Pulmonary/Chest: Breath sounds normal. No respiratory distress.  Abdominal: Soft. She exhibits no distension. There is no tenderness. There is no rebound and no guarding.  Musculoskeletal: Normal range of motion. She exhibits no edema or tenderness.  Neurological: She is alert and oriented to person, place, and time.  Skin: Skin is warm and dry.  1 cm around erythematous area and 2 cm around erythematous area to the midline groin.   Psychiatric: She has a normal mood and affect. Her behavior is normal.  Nursing note and vitals reviewed.   ED Course  Procedures (including critical care time) DIAGNOSTIC STUDIES: Oxygen Saturation is 95% on RA, adequate by my interpretation.    COORDINATION OF CARE: 3:36 PM  Discussed treatment plan with pt at bedside and pt agreed to plan.   Labs Review Labs Reviewed  CBC WITH DIFFERENTIAL/PLATELET  COMPREHENSIVE METABOLIC PANEL    Imaging Review No results found.   EKG Interpretation None      MDM   Pt given rx for doxycycline to cover skin infection.   Pt advised to continue cipro.   Pt advised to recheck with her MD next week.   Final diagnoses:  Skin infection  UTI (lower urinary tract infection)   I personally performed the services in this documentation, which was scribed in my presence.  The recorded information has been reviewed and considered.  Barnet Pall.   I personally performed the services in this documentation, which was scribed in my presence.  The recorded information has been reviewed and considered.   Barnet Pall.  Lonia Skinner Wimer, PA-C 10/16/14 1706  Jerelyn Scott, MD 10/17/14 [phone]

## 2014-10-16 NOTE — ED Notes (Addendum)
Patient reports back pain. MD made aware.

## 2014-10-16 NOTE — Discharge Instructions (Signed)

## 2014-10-16 NOTE — ED Notes (Signed)
Called RT to give Albuterol.

## 2014-10-17 ENCOUNTER — Inpatient Hospital Stay (HOSPITAL_COMMUNITY): Payer: Medicare Other

## 2014-10-17 ENCOUNTER — Encounter (HOSPITAL_COMMUNITY): Payer: Self-pay | Admitting: Internal Medicine

## 2014-10-17 DIAGNOSIS — E039 Hypothyroidism, unspecified: Secondary | ICD-10-CM

## 2014-10-17 DIAGNOSIS — I10 Essential (primary) hypertension: Secondary | ICD-10-CM

## 2014-10-17 DIAGNOSIS — E875 Hyperkalemia: Secondary | ICD-10-CM

## 2014-10-17 DIAGNOSIS — N179 Acute kidney failure, unspecified: Secondary | ICD-10-CM

## 2014-10-17 DIAGNOSIS — I6529 Occlusion and stenosis of unspecified carotid artery: Secondary | ICD-10-CM | POA: Diagnosis present

## 2014-10-17 DIAGNOSIS — L03314 Cellulitis of groin: Secondary | ICD-10-CM

## 2014-10-17 DIAGNOSIS — E440 Moderate protein-calorie malnutrition: Secondary | ICD-10-CM | POA: Diagnosis present

## 2014-10-17 DIAGNOSIS — A419 Sepsis, unspecified organism: Principal | ICD-10-CM

## 2014-10-17 LAB — URINE CULTURE
COLONY COUNT: NO GROWTH
CULTURE: NO GROWTH

## 2014-10-17 LAB — VITAMIN B12: VITAMIN B 12: 411 pg/mL (ref 180–914)

## 2014-10-17 LAB — GLUCOSE, CAPILLARY
GLUCOSE-CAPILLARY: 54 mg/dL — AB (ref 65–99)
GLUCOSE-CAPILLARY: 57 mg/dL — AB (ref 65–99)
GLUCOSE-CAPILLARY: 166 mg/dL — AB (ref 65–99)
GLUCOSE-CAPILLARY: 79 mg/dL (ref 65–99)
GLUCOSE-CAPILLARY: 66 mg/dL (ref 65–99)
Glucose-Capillary: 87 mg/dL (ref 65–99)
Glucose-Capillary: 97 mg/dL (ref 65–99)

## 2014-10-17 LAB — BASIC METABOLIC PANEL
ANION GAP: 5 (ref 5–15)
BUN: 50 mg/dL — ABNORMAL HIGH (ref 6–20)
CO2: 18 mmol/L — AB (ref 22–32)
Calcium: 7.6 mg/dL — ABNORMAL LOW (ref 8.9–10.3)
Chloride: 117 mmol/L — ABNORMAL HIGH (ref 101–111)
Creatinine, Ser: 2.19 mg/dL — ABNORMAL HIGH (ref 0.44–1.00)
GFR, EST AFRICAN AMERICAN: 25 mL/min — AB (ref >60)
GFR, EST NON AFRICAN AMERICAN: 21 mL/min — AB (ref >60)
GLUCOSE: 81 mg/dL (ref 65–99)
POTASSIUM: 5.6 mmol/L — AB (ref 3.5–5.1)
Sodium: 140 mmol/L (ref 135–145)

## 2014-10-17 LAB — LIPID PANEL
Cholesterol: 138 mg/dL (ref 0–200)
HDL: 24 mg/dL — AB (ref >40)
LDL Cholesterol: 72 mg/dL (ref 0–99)
Total CHOL/HDL Ratio: 5.8 RATIO
Triglycerides: 208 mg/dL — ABNORMAL HIGH (ref <150)
VLDL: 42 mg/dL — ABNORMAL HIGH (ref 0–40)

## 2014-10-17 LAB — CBC
HCT: 21 % — ABNORMAL LOW (ref 36.0–46.0)
HEMOGLOBIN: 6.9 g/dL — AB (ref 12.0–15.0)
MCH: 28.9 pg (ref 26.0–34.0)
MCHC: 32.9 g/dL (ref 30.0–36.0)
MCV: 87.9 fL (ref 78.0–100.0)
PLATELETS: 137 10*3/uL — AB (ref 150–400)
RBC: 2.39 MIL/uL — AB (ref 3.87–5.11)
RDW: 14.9 % (ref 11.5–15.5)
WBC: 4.1 10*3/uL (ref 4.0–10.5)

## 2014-10-17 LAB — TSH: TSH: 0.046 u[IU]/mL — ABNORMAL LOW (ref 0.350–4.500)

## 2014-10-17 LAB — IRON AND TIBC
Iron: 43 ug/dL (ref 28–170)
Saturation Ratios: 21 % (ref 10.4–31.8)
TIBC: 202 ug/dL — ABNORMAL LOW (ref 250–450)
UIBC: 159 ug/dL

## 2014-10-17 LAB — FERRITIN: Ferritin: 826 ng/mL — ABNORMAL HIGH (ref 11–307)

## 2014-10-17 LAB — CREATININE, URINE, RANDOM: CREATININE, URINE: 16.3 mg/dL

## 2014-10-17 LAB — LACTIC ACID, PLASMA
LACTIC ACID, VENOUS: 1.2 mmol/L (ref 0.5–2.0)
LACTIC ACID, VENOUS: 3.9 mmol/L — AB (ref 0.5–2.0)

## 2014-10-17 LAB — RETICULOCYTES
RBC.: 2.48 MIL/uL — ABNORMAL LOW (ref 3.87–5.11)
RETIC CT PCT: 2 % (ref 0.4–3.1)
Retic Count, Absolute: 49.6 10*3/uL (ref 19.0–186.0)

## 2014-10-17 LAB — FOLATE: Folate: 20.8 ng/mL (ref >5.9)

## 2014-10-17 LAB — HEMOGLOBIN AND HEMATOCRIT, BLOOD
HEMATOCRIT: 22 % — AB (ref 36.0–46.0)
HEMOGLOBIN: 7.1 g/dL — AB (ref 12.0–15.0)

## 2014-10-17 LAB — POTASSIUM: Potassium: 4.7 mmol/L (ref 3.5–5.1)

## 2014-10-17 LAB — T4, FREE: Free T4: 1.04 ng/dL (ref 0.61–1.12)

## 2014-10-17 LAB — SODIUM, URINE, RANDOM: Sodium, Ur: 95 mmol/L

## 2014-10-17 MED ORDER — DEXTROSE 50 % IV SOLN
INTRAVENOUS | Status: AC
  Administered 2014-10-17: 50 mL
  Filled 2014-10-17: qty 50

## 2014-10-17 MED ORDER — SODIUM CHLORIDE 0.9 % IV BOLUS (SEPSIS)
1000.0000 mL | Freq: Once | INTRAVENOUS | Status: AC
  Administered 2014-10-17: 1000 mL via INTRAVENOUS

## 2014-10-17 MED ORDER — FERROUS GLUCONATE 324 (38 FE) MG PO TABS
324.0000 mg | ORAL_TABLET | Freq: Every day | ORAL | Status: DC
  Administered 2014-10-17 – 2014-10-21 (×5): 324 mg via ORAL
  Filled 2014-10-17 (×7): qty 1

## 2014-10-17 MED ORDER — SODIUM POLYSTYRENE SULFONATE 15 GM/60ML PO SUSP
15.0000 g | Freq: Once | ORAL | Status: DC
  Filled 2014-10-17: qty 60

## 2014-10-17 MED ORDER — SODIUM CHLORIDE 0.9 % IV BOLUS (SEPSIS)
250.0000 mL | Freq: Once | INTRAVENOUS | Status: AC
  Administered 2014-10-17: 250 mL via INTRAVENOUS

## 2014-10-17 MED ORDER — DARBEPOETIN ALFA 60 MCG/0.3ML IJ SOSY
60.0000 ug | PREFILLED_SYRINGE | INTRAMUSCULAR | Status: DC
  Administered 2014-10-17: 60 ug via SUBCUTANEOUS
  Filled 2014-10-17: qty 0.3

## 2014-10-17 MED ORDER — LEVOTHYROXINE SODIUM 125 MCG PO TABS
62.5000 ug | ORAL_TABLET | Freq: Every day | ORAL | Status: DC
  Administered 2014-10-18: 62.5 ug via ORAL
  Filled 2014-10-17 (×2): qty 0.5

## 2014-10-17 MED ORDER — ADULT MULTIVITAMIN W/MINERALS CH
1.0000 | ORAL_TABLET | Freq: Every day | ORAL | Status: DC
  Administered 2014-10-17 – 2014-10-21 (×5): 1 via ORAL
  Filled 2014-10-17 (×6): qty 1

## 2014-10-17 MED ORDER — SODIUM POLYSTYRENE SULFONATE 15 GM/60ML PO SUSP
45.0000 g | Freq: Once | ORAL | Status: DC
  Filled 2014-10-17: qty 180

## 2014-10-17 MED ORDER — INSULIN ASPART 100 UNIT/ML ~~LOC~~ SOLN
0.0000 [IU] | Freq: Three times a day (TID) | SUBCUTANEOUS | Status: DC
  Administered 2014-10-19 – 2014-10-20 (×3): 2 [IU] via SUBCUTANEOUS
  Administered 2014-10-20: 1 [IU] via SUBCUTANEOUS
  Administered 2014-10-21: 3 [IU] via SUBCUTANEOUS

## 2014-10-17 NOTE — Progress Notes (Signed)
Hypoglycemic Event  CBG: 54  Treatment: D50 IV 50 mL  Symptoms: None and n/a  Follow-up CBG: Time: 0632 CBG Result: 166  Possible Reasons for Event: Inadequate meal intake  Comments/MD notified: given D50     Mikhael Hendriks L  Remember to initiate Hypoglycemia Order Set & complete

## 2014-10-17 NOTE — Progress Notes (Signed)
UR completed 

## 2014-10-17 NOTE — Progress Notes (Signed)
CRITICAL VALUE ALERT  Critical value received:  Lactic acid 3.9  Date of notification:  10/17/2014  Time of notification:  0047  Critical value read back:Yes.    Nurse who received alert:  Herminio Headsoliza Bunny Kleist  MD notified (1st page):  Kirby,NP  Time of first page:  0048  MD notified (2nd page):  Time of second page:  Responding MD:  Odessa FlemingKirby,NP  Time MD responded:  [phone]

## 2014-10-17 NOTE — Progress Notes (Signed)
[facility] PROGRESS NOTE  [name]:[id number] DOB: 1940-06-02 DOA: 10/16/2014 PCP: Samuel Jester, DO  Assessment/Plan: #1 cellulitis of mons pubis with sepsis ?? etiology. Patient with some clinical improvement. Currently afebrile. Patient immunosuppressed. Monitor for abscess formation. Continue empiric IV vancomycin and IV Zosyn. Follow.  #2 acute on chronic kidney disease stage III Likely secondary to prerenal azotemia as patient is noted to be hypotensive and also septic. Patient's blood pressure responded to IV fluids. Renal transplant ultrasound pending. Urine studies pending. Tacrolimus levels pending. Continue CellCept and Prograf. Continue IV fluids. Nephrology has been consulted and following along.   #3 hyperkalemia Likely secondary to problem #2. Repeat potassium of 4.7. Follow.  #4 anemia Likely anemia of chronic disease. Check an anemia panel. Repeat hemoglobin of 7.1. Transfusion threshold hemoglobin less than 7. Follow. Renal following.  #5 hypotension Likely secondary to problem #1. Blood pressure responded some to IV fluids. Continue empiric IV antibiotics. Follow.  #6 diabetes mellitus Hemoglobin A1c pending. CBGs have ranged from 81-127. Continue Lantus at 6 units daily. Sliding scale insulin.  #7 hypothyroidism TSH decreased at 0.046. Check a free T4. Decrease Synthroid to 62.5 mcg daily. Will likely need repeat thyroid function studies done in about 4-6 weeks.  #8 gastroesophageal reflux disease PPI.  #9 hypertension Will discontinue metoprolol for now secondary to hypotension. Follow.  #10 moderate protein calorie malnutrition Continue nutritional supplements.  #11 prophylaxis Heparin for DVT prophylaxis.  Code Status: Full Family Communication: Updated patient and sister at bedside. Disposition Plan: Home when medically stable.   Consultants:  Nephrology: Dr. Allena Katz 10/17/2014  Procedures:  Renal transplant ultrasound  10/17/2014 pending  Chest x-ray 10/16/2014    Antibiotics:  IV vancomycin 10/16/2014  IV Zosyn 10/16/2014  HPI/Subjective: Patient states she's feeling better than on admission. No shortness of breath. No chest pain. No complaints.  Objective: Filed Vitals:   10/17/14 1402  BP: 100/57  Pulse: 90  Temp: 98.5 F (36.9 C)  Resp: 16    Intake/Output Summary (Last 24 hours) at 10/17/14 1729 Last data filed at 10/17/14 1633  Gross per 24 hour  Intake 2541.67 ml  Output   1200 ml  Net 1341.67 ml   Filed Weights   10/16/14 1459 10/16/14 2140 10/17/14 0709  Weight: 44.815 kg (98 lb 12.8 oz) 44.044 kg (97 lb 1.6 oz) 44 kg (97 lb)    Exam:   General:  NAD  Cardiovascular: RRR  Respiratory: CTAB  Abdomen: Soft, nontender, nondistended, positive bowel sounds.  Musculoskeletal: No cyanosis or edema.   Skin: Circular lesion over the right mons pubis without erythema, nontender with some induration proximally 2 cm in size.  Data Reviewed: Basic Metabolic Panel:  Recent Labs Lab 10/16/14 1635 10/16/14 2035 10/17/14 0526 10/17/14 0940  NA 137 137 140  --   K 6.0* 3.9  3.8 5.6* 4.7  CL 113* 113* 117*  --   CO2 16* 13* 18*  --   GLUCOSE 103* 127* 81  --   BUN 71* 66* 50*  --   CREATININE 2.79* 2.65* 2.19*  --   CALCIUM 8.9 8.5* 7.6*  --    Liver Function Tests:  Recent Labs Lab 10/16/14 1635  AST 12*  ALT 11*  ALKPHOS 72  BILITOT 0.1*  PROT 6.6  ALBUMIN 3.7   No results for input(s): LIPASE, AMYLASE in the last 168 hours. No results for input(s): AMMONIA in the last 168 hours. CBC:  Recent Labs Lab 10/16/14 1635 10/17/14 0526 10/17/14  0940  WBC 4.6 4.1  --   NEUTROABS 3.2  --   --   HGB 9.0* 6.9* 7.1*  HCT 27.8* 21.0* 22.0*  MCV 88.5 87.9  --   PLT 175 137*  --    Cardiac Enzymes:  Recent Labs Lab 10/16/14 2035  TROPONINI <0.03   BNP (last 3 results) No results for input(s): BNP in the last 8760 hours.  ProBNP (last 3  results) No results for input(s): PROBNP in the last 8760 hours.  CBG:  Recent Labs Lab 10/16/14 2113 10/17/14 0549 10/17/14 0607 10/17/14 0632 10/17/14 1124  GLUCAP 116* 57* 54* 166* 79    No results found for this or any previous visit (from the past 240 hour(s)).   Studies: Dg Chest Port 1 View  10/16/2014   CLINICAL DATA:  Abscess.  UTI for 3 days.  EXAM: PORTABLE CHEST - 1 VIEW  COMPARISON:  None.  FINDINGS: The heart size and mediastinal contours are within normal limits. Both lungs are clear. The visualized skeletal structures are unremarkable.  IMPRESSION: No active disease.   Electronically Signed   By: Signa Kellaylor  Stroud M.D.   On: 10/16/2014 21:09    Scheduled Meds: . acidophilus  1 capsule Oral Daily  . aspirin  81 mg Oral QHS  . clonazePAM  1 mg Oral QHS  . darbepoetin (ARANESP) injection - NON-DIALYSIS  60 mcg Subcutaneous Q Fri-1800  . ferrous gluconate  324 mg Oral Q breakfast  . fluconazole  100 mg Oral Daily  . heparin  5,000 Units Subcutaneous 3 times per day  . insulin aspart  0-9 Units Subcutaneous TID WC  . insulin glargine  6 Units Subcutaneous QHS  . levothyroxine  75 mcg Oral QAC breakfast  . metoprolol  50 mg Oral BID  . multivitamin with minerals  1 tablet Oral Daily  . mycophenolate  250 mg Oral BID  . pantoprazole  40 mg Oral Daily  . piperacillin-tazobactam (ZOSYN)  IV  2.25 g Intravenous Q6H  . sodium chloride  3 mL Intravenous Q12H  . sodium polystyrene  45 g Oral Once  . tacrolimus  0.5 mg Oral QHS  . tacrolimus  1 mg Oral Daily  . [START ON 10/18/2014] vancomycin  500 mg Intravenous Q48H   Continuous Infusions: . sodium chloride 100 mL/hr at 10/17/14 1426    Principal Problem:   Cellulitis of right groin Active Problems:   Sepsis   Anemia   Essential hypertension   GERD (gastroesophageal reflux disease)   Hypothyroidism   UTI (lower urinary tract infection)   Carotid artery stenosis   Malnutrition of moderate degree    Time  spent: 40 mins    [facility]th[facility] Anthony Med CampusHOMPSON,DANIEL MD [facility] Pager [phone]. If 7PM-7AM, please contact night-coverage at [email or web address], password [email or web address] 10/17/2014, 5:29 PM  LOS: 1 day

## 2014-10-17 NOTE — Consult Note (Addendum)
Reason for Consult: Assistance with management of acute on chronic renal failure stage IIIT Referring Physician: Irine Seal M.D. 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 Mercy Moore at [facility]. Earlier this week, she informs me that she called the office with her complaints of pain/burning over the mons pubis and a prescription for ciprofloxacin was sent in however, symptoms continued to worsen prompting presentation to the emergency room.  She presented to the hospital yesterday with cellulitis over her mons pubis with features of sepsis. She was noted to have worsening renal function with a creatinine of 2.7 and was hyperkalemic with a potassium of 5.6. She denies any interruptions to her immunosuppression therapy and did not have any nausea, vomiting, diarrhea or obvious changes to her appetite. She denies any chest pain or shortness of breath. Reports good urine output and denies any burning while urinating.  Past Medical History  Diagnosis Date  . Hypertension   . Anemia   . GERD (gastroesophageal reflux disease)   . Hypothyroidism   . Family history of adverse reaction to anesthesia     "my daughter; they thought she was asleep and she wasn't"  . Hyperlipemia   . Pneumonia "1-2 times"  . Type II diabetes mellitus   . History of blood transfusion "several"    "all related to anemia"  . Arthritis     "knees" (10/16/2014)  . Chronic kidney disease     h/o transplant  . Frequent UTI   . Carotid artery stenosis     Past Surgical History  Procedure Laterality Date  . Kidney transplant Right 2004    at [facility]  . Kidney surgery      multiple  . Thyroidectomy      1/2 removed  . Back surgery    . Tonsillectomy    . Appendectomy    .  Cholecystectomy open    . Lumbar disc surgery      "L5"  . Abdominal hysterectomy    . Tubal ligation      Family History  Problem Relation Age of Onset  . Diabetes Mother   . Hypertension Mother   . Heart disease Father   . Varicose Veins Father   . Hypertension Sister   . Cancer Daughter   . Hypertension Son     Social History:  reports that she has never smoked. She has never used smokeless tobacco. She reports that she does not drink alcohol or use illicit drugs.  Allergies:  Allergies  Allergen Reactions  . Codeine Nausea And Vomiting  . Elavil [Amitriptyline] Other (See Comments)    unknown  . Iodinated Diagnostic Agents      Renal transplant pt  . Lipitor [Atorvastatin]   . Magnesium Hives    Pt tolerated oral and IV during 06/2014 admission   . Metoclopramide     Other reaction(s): Other (See Comments) "doesn't work"  . Morphine     Other reaction(s): Confusion (intolerance)  . Morphine And Related   . Neurontin [Gabapentin]   . Niacin And Related   . Norvasc [Amlodipine Besylate]   . Talwin [Pentazocine]   . Ultram [Tramadol]     Medications:  Scheduled: . acidophilus  1 capsule Oral Daily  . aspirin  81 mg Oral QHS  . clonazePAM  1  mg Oral QHS  . ferrous gluconate  324 mg Oral Q breakfast  . fluconazole  100 mg Oral Daily  . heparin  5,000 Units Subcutaneous 3 times per day  . insulin aspart  0-9 Units Subcutaneous TID WC  . insulin glargine  6 Units Subcutaneous QHS  . levothyroxine  75 mcg Oral QAC breakfast  . metoprolol  50 mg Oral BID  . multivitamin with minerals  1 tablet Oral Daily  . mycophenolate  250 mg Oral BID  . pantoprazole  40 mg Oral Daily  . piperacillin-tazobactam (ZOSYN)  IV  2.25 g Intravenous Q6H  . sodium chloride  3 mL Intravenous Q12H  . sodium polystyrene  45 g Oral Once  . tacrolimus  0.5 mg Oral QHS  . tacrolimus  1 mg Oral Daily  . [START ON 10/18/2014] vancomycin  500 mg Intravenous Q48H    Results for orders  placed or performed during the hospital encounter of 10/16/14 (from the past 48 hour(s))  CBC with Differential/Platelet     Status: Abnormal   Collection Time: 10/16/14  4:35 PM  Result Value Ref Range   WBC 4.6 4.0 - 10.5 K/uL   RBC 3.14 (L) 3.87 - 5.11 MIL/uL   Hemoglobin 9.0 (L) 12.0 - 15.0 g/dL   HCT 27.8 (L) 36.0 - 46.0 %   MCV 88.5 78.0 - 100.0 fL   MCH 28.7 26.0 - 34.0 pg   MCHC 32.4 30.0 - 36.0 g/dL   RDW 15.0 11.5 - 15.5 %   Platelets 175 150 - 400 K/uL   Neutrophils Relative % 69 43 - 77 %   Neutro Abs 3.2 1.7 - 7.7 K/uL   Lymphocytes Relative 20 12 - 46 %   Lymphs Abs 0.9 0.7 - 4.0 K/uL   Monocytes Relative 8 3 - 12 %   Monocytes Absolute 0.4 0.1 - 1.0 K/uL   Eosinophils Relative 3 0 - 5 %   Eosinophils Absolute 0.1 0.0 - 0.7 K/uL   Basophils Relative 0 0 - 1 %   Basophils Absolute 0.0 0.0 - 0.1 K/uL  Comprehensive metabolic panel     Status: Abnormal   Collection Time: 10/16/14  4:35 PM  Result Value Ref Range   Sodium 137 135 - 145 mmol/L   Potassium 6.0 (H) 3.5 - 5.1 mmol/L   Chloride 113 (H) 101 - 111 mmol/L   CO2 16 (L) 22 - 32 mmol/L   Glucose, Bld 103 (H) 65 - 99 mg/dL   BUN 71 (H) 6 - 20 mg/dL   Creatinine, Ser 2.79 (H) 0.44 - 1.00 mg/dL   Calcium 8.9 8.9 - 10.3 mg/dL   Total Protein 6.6 6.5 - 8.1 g/dL   Albumin 3.7 3.5 - 5.0 g/dL   AST 12 (L) 15 - 41 U/L   ALT 11 (L) 14 - 54 U/L   Alkaline Phosphatase 72 38 - 126 U/L   Total Bilirubin 0.1 (L) 0.3 - 1.2 mg/dL   GFR calc non Af Amer 16 (L) >60 mL/min   GFR calc Af Amer 19 (L) >60 mL/min    Comment: (NOTE) The eGFR has been calculated using the CKD EPI equation. This calculation has not been validated in all clinical situations. eGFR's persistently <60 mL/min signify possible Chronic Kidney Disease.    Anion gap 8 5 - 15  Troponin I     Status: None   Collection Time: 10/16/14  8:35 PM  Result Value Ref Range   Troponin I <0.03 <  0.031 ng/mL    Comment:        NO INDICATION OF MYOCARDIAL  INJURY.   Potassium     Status: None   Collection Time: 10/16/14  8:35 PM  Result Value Ref Range   Potassium 3.9 3.5 - 5.1 mmol/L  Basic metabolic panel     Status: Abnormal   Collection Time: 10/16/14  8:35 PM  Result Value Ref Range   Sodium 137 135 - 145 mmol/L   Potassium 3.8 3.5 - 5.1 mmol/L    Comment: DELTA CHECK NOTED   Chloride 113 (H) 101 - 111 mmol/L   CO2 13 (L) 22 - 32 mmol/L   Glucose, Bld 127 (H) 65 - 99 mg/dL   BUN 66 (H) 6 - 20 mg/dL   Creatinine, Ser 2.65 (H) 0.44 - 1.00 mg/dL   Calcium 8.5 (L) 8.9 - 10.3 mg/dL   GFR calc non Af Amer 17 (L) >60 mL/min   GFR calc Af Amer 20 (L) >60 mL/min    Comment: (NOTE) The eGFR has been calculated using the CKD EPI equation. This calculation has not been validated in all clinical situations. eGFR's persistently <60 mL/min signify possible Chronic Kidney Disease.    Anion gap 11 5 - 15  Lactic acid, plasma     Status: Abnormal   Collection Time: 10/16/14  8:35 PM  Result Value Ref Range   Lactic Acid, Venous 2.7 (HH) 0.5 - 2.0 mmol/L    Comment: CRITICAL RESULT CALLED TO, READ BACK BY AND VERIFIED WITH: YNOT,L RN 10/16/2014 2145 JORDANS REPEATED TO VERIFY   Procalcitonin     Status: None   Collection Time: 10/16/14  8:35 PM  Result Value Ref Range   Procalcitonin <0.10 ng/mL    Comment:        Interpretation: PCT (Procalcitonin) <= 0.5 ng/mL: Systemic infection (sepsis) is not likely. Local bacterial infection is possible. (NOTE)         ICU PCT Algorithm               Non ICU PCT Algorithm    ----------------------------     ------------------------------         PCT < 0.25 ng/mL                 PCT < 0.1 ng/mL     Stopping of antibiotics            Stopping of antibiotics       strongly encouraged.               strongly encouraged.    ----------------------------     ------------------------------       PCT level decrease by               PCT < 0.25 ng/mL       >= 80% from peak PCT       OR PCT 0.25 - 0.5  ng/mL          Stopping of antibiotics                                             encouraged.     Stopping of antibiotics           encouraged.    ----------------------------     ------------------------------       PCT level decrease by  PCT >= 0.25 ng/mL       < 80% from peak PCT        AND PCT >= 0.5 ng/mL            Continuin g antibiotics                                              encouraged.       Continuing antibiotics            encouraged.    ----------------------------     ------------------------------     PCT level increase compared          PCT > 0.5 ng/mL         with peak PCT AND          PCT >= 0.5 ng/mL             Escalation of antibiotics                                          strongly encouraged.      Escalation of antibiotics        strongly encouraged.   Protime-INR     Status: None   Collection Time: 10/16/14  8:35 PM  Result Value Ref Range   Prothrombin Time 14.7 11.6 - 15.2 seconds   INR 1.13 0.00 - 1.49  APTT     Status: None   Collection Time: 10/16/14  8:35 PM  Result Value Ref Range   aPTT 29 24 - 37 seconds  Urinalysis, Routine w reflex microscopic (not at [facility])     Status: Abnormal   Collection Time: 10/16/14  8:59 PM  Result Value Ref Range   Color, Urine YELLOW YELLOW   APPearance CLEAR CLEAR   Specific Gravity, Urine 1.009 1.005 - 1.030   pH 5.0 5.0 - 8.0   Glucose, UA 100 (A) NEGATIVE mg/dL   Hgb urine dipstick NEGATIVE NEGATIVE   Bilirubin Urine NEGATIVE NEGATIVE   Ketones, ur NEGATIVE NEGATIVE mg/dL   Protein, ur NEGATIVE NEGATIVE mg/dL   Urobilinogen, UA 0.2 0.0 - 1.0 mg/dL   Nitrite NEGATIVE NEGATIVE   Leukocytes, UA NEGATIVE NEGATIVE    Comment: MICROSCOPIC NOT DONE ON URINES WITH NEGATIVE PROTEIN, BLOOD, LEUKOCYTES, NITRITE, OR GLUCOSE <1000 mg/dL.  POC CBG, ED     Status: Abnormal   Collection Time: 10/16/14  9:13 PM  Result Value Ref Range   Glucose-Capillary 116 (H) 65 - 99 mg/dL  Lactic acid, plasma      Status: Abnormal   Collection Time: 10/16/14 11:50 PM  Result Value Ref Range   Lactic Acid, Venous 3.9 (HH) 0.5 - 2.0 mmol/L    Comment: CRITICAL RESULT CALLED TO, READ BACK BY AND VERIFIED WITH: YNOT,L RN 10/17/2014 0045 JORDANS REPEATED TO VERIFY   Creatinine, urine, random     Status: None   Collection Time: 10/17/14  3:09 AM  Result Value Ref Range   Creatinine, Urine 16.30 mg/dL  Sodium, urine, random     Status: None   Collection Time: 10/17/14  3:09 AM  Result Value Ref Range   Sodium, Ur 95 mmol/L  Lipid panel     Status: Abnormal   Collection Time: 10/17/14  5:26 AM  Result Value  Ref Range   Cholesterol 138 0 - 200 mg/dL   Triglycerides 208 (H) <150 mg/dL   HDL 24 (L) >40 mg/dL   Total CHOL/HDL Ratio 5.8 RATIO   VLDL 42 (H) 0 - 40 mg/dL   LDL Cholesterol 72 0 - 99 mg/dL    Comment:        Total Cholesterol/HDL:CHD Risk Coronary Heart Disease Risk Table                     Men   Women  1/2 Average Risk   3.4   3.3  Average Risk       5.0   4.4  2 X Average Risk   9.6   7.1  3 X Average Risk  23.4   11.0        Use the calculated Patient Ratio above and the CHD Risk Table to determine the patient's CHD Risk.        ATP III CLASSIFICATION (LDL):  <100     mg/dL   Optimal  100-129  mg/dL   Near or Above                    Optimal  130-159  mg/dL   Borderline  160-189  mg/dL   High  >190     mg/dL   Very High   TSH     Status: Abnormal   Collection Time: 10/17/14  5:26 AM  Result Value Ref Range   TSH 0.046 (L) 0.350 - 4.500 uIU/mL  Basic metabolic panel     Status: Abnormal   Collection Time: 10/17/14  5:26 AM  Result Value Ref Range   Sodium 140 135 - 145 mmol/L   Potassium 5.6 (H) 3.5 - 5.1 mmol/L    Comment: DELTA CHECK NOTED   Chloride 117 (H) 101 - 111 mmol/L   CO2 18 (L) 22 - 32 mmol/L   Glucose, Bld 81 65 - 99 mg/dL   BUN 50 (H) 6 - 20 mg/dL   Creatinine, Ser 2.19 (H) 0.44 - 1.00 mg/dL   Calcium 7.6 (L) 8.9 - 10.3 mg/dL   GFR calc non Af Amer  21 (L) >60 mL/min   GFR calc Af Amer 25 (L) >60 mL/min    Comment: (NOTE) The eGFR has been calculated using the CKD EPI equation. This calculation has not been validated in all clinical situations. eGFR's persistently <60 mL/min signify possible Chronic Kidney Disease.    Anion gap 5 5 - 15  CBC     Status: Abnormal   Collection Time: 10/17/14  5:26 AM  Result Value Ref Range   WBC 4.1 4.0 - 10.5 K/uL   RBC 2.39 (L) 3.87 - 5.11 MIL/uL   Hemoglobin 6.9 (LL) 12.0 - 15.0 g/dL    Comment: REPEATED TO VERIFY CRITICAL RESULT CALLED TO, READ BACK BY AND VERIFIED WITH: WNOZ,L RN 1610 10/17/14 TEETERN    HCT 21.0 (L) 36.0 - 46.0 %   MCV 87.9 78.0 - 100.0 fL   MCH 28.9 26.0 - 34.0 pg   MCHC 32.9 30.0 - 36.0 g/dL   RDW 14.9 11.5 - 15.5 %   Platelets 137 (L) 150 - 400 K/uL  Glucose, capillary     Status: Abnormal   Collection Time: 10/17/14  5:49 AM  Result Value Ref Range   Glucose-Capillary 57 (L) 65 - 99 mg/dL  Glucose, capillary     Status: Abnormal   Collection Time: 10/17/14  6:07  AM  Result Value Ref Range   Glucose-Capillary 54 (L) 65 - 99 mg/dL  Glucose, capillary     Status: Abnormal   Collection Time: 10/17/14  6:32 AM  Result Value Ref Range   Glucose-Capillary 166 (H) 65 - 99 mg/dL  Vitamin B12     Status: None   Collection Time: 10/17/14  9:40 AM  Result Value Ref Range   Vitamin B-12 411 180 - 914 pg/mL    Comment: (NOTE) This assay is not validated for testing neonatal or myeloproliferative syndrome specimens for Vitamin B12 levels.   Folate     Status: None   Collection Time: 10/17/14  9:40 AM  Result Value Ref Range   Folate 20.8 >5.9 ng/mL  Iron and TIBC     Status: Abnormal   Collection Time: 10/17/14  9:40 AM  Result Value Ref Range   Iron 43 28 - 170 ug/dL   TIBC 202 (L) 250 - 450 ug/dL   Saturation Ratios 21 10.4 - 31.8 %   UIBC 159 ug/dL  Ferritin     Status: Abnormal   Collection Time: 10/17/14  9:40 AM  Result Value Ref Range   Ferritin 826 (H)  11 - 307 ng/mL  Reticulocytes     Status: Abnormal   Collection Time: 10/17/14  9:40 AM  Result Value Ref Range   Retic Ct Pct 2.0 0.4 - 3.1 %   RBC. 2.48 (L) 3.87 - 5.11 MIL/uL   Retic Count, Manual 49.6 19.0 - 186.0 K/uL  Potassium     Status: None   Collection Time: 10/17/14  9:40 AM  Result Value Ref Range   Potassium 4.7 3.5 - 5.1 mmol/L  Hemoglobin and hematocrit, blood     Status: Abnormal   Collection Time: 10/17/14  9:40 AM  Result Value Ref Range   Hemoglobin 7.1 (L) 12.0 - 15.0 g/dL   HCT 22.0 (L) 36.0 - 46.0 %  Lactic acid, plasma     Status: None   Collection Time: 10/17/14 10:10 AM  Result Value Ref Range   Lactic Acid, Venous 1.2 0.5 - 2.0 mmol/L  Glucose, capillary     Status: None   Collection Time: 10/17/14 11:24 AM  Result Value Ref Range   Glucose-Capillary 79 65 - 99 mg/dL   Comment 1 Notify RN     Dg Chest Port 1 View  10/16/2014   CLINICAL DATA:  Abscess.  UTI for 3 days.  EXAM: PORTABLE CHEST - 1 VIEW  COMPARISON:  None.  FINDINGS: The heart size and mediastinal contours are within normal limits. Both lungs are clear. The visualized skeletal structures are unremarkable.  IMPRESSION: No active disease.   Electronically Signed   By: Kerby Moors M.D.   On: 10/16/2014 21:09    Review of Systems  Constitutional: Negative.   HENT: Negative.   Eyes: Negative.   Respiratory: Negative.   Cardiovascular: Negative.   Gastrointestinal: Negative.   Genitourinary: Negative.   Musculoskeletal: Negative.   Skin: Positive for rash.  Neurological: Negative.   Endo/Heme/Allergies: Negative.    Blood pressure 86/46, pulse 87, temperature 98.3 F (36.8 C), temperature source Oral, resp. rate 18, height 5' 3"  (1.6 m), weight 44 kg (97 lb), SpO2 100 %. Physical Exam  Nursing note and vitals reviewed. Constitutional: She is oriented to person, place, and time. She appears well-developed and well-nourished. No distress.  HENT:  Head: Normocephalic and atraumatic.   Nose: Nose normal.  Eyes: EOM are normal. Pupils are  equal, round, and reactive to light. No scleral icterus.  Neck: Normal range of motion. Neck supple. No JVD present.  Cardiovascular: Normal rate, regular rhythm and normal heart sounds.   No murmur heard. Respiratory: Effort normal and breath sounds normal. She has no wheezes. She has no rales.  GI: Soft. Bowel sounds are normal. There is tenderness.  Some suprapubic tenderness adjacent to mons pubis  Musculoskeletal: She exhibits no edema.  Neurological: She is alert and oriented to person, place, and time.  Skin: Skin is warm and dry.  Cellulitis with some skin breakdown/scabs over mons    Assessment/Plan: 1. Acute renal failure on chronic kidney disease stage III T: Appears to be primarily hemodynamically mediated with features of sepsis/hypotension. Renal function appears to be improving with good urine output following intravenous fluids and anti-microbial therapy directed to her cellulitis. No interruptions to her antirejection therapy and tacrolimus levels are pending. I agree with continuing her intravenous fluids for another 24 hours and then discontinuing these as her oral intake appears to have picked up considerably. She does not have any acute electrolyte abnormalities to prompt intervention at this time and an allograft ultrasound is pending. 2. Cellulitis of mons pubis: Agree with broad-spectrum antibiotic therapy with vancomycin and Zosyn in this immunosuppressed patient, maintain vigilance for possible abscess formation. 3. Anemia: I will check iron studies and plan to start ESA 4. Diabetes mellitus: Glycemic control per primary service-suspect some difficulties in the face of infection 5. Hypotension: Anti-hypertensive therapy on hold, managing underlying infection/sepsis   Micco Bourbeau K. 10/17/2014, 12:22 PM

## 2014-10-17 NOTE — Progress Notes (Signed)
Initial Nutrition Assessment  DOCUMENTATION CODES:  Non-severe (moderate) malnutrition in context of chronic illness, Underweight  INTERVENTION: Snacks BID Carnation Instant Breakfast once daily Multivitamin with minerals daily  NUTRITION DIAGNOSIS:  Malnutrition related to chronic illness as evidenced by moderate depletions of muscle mass, moderate depletion of body fat.   GOAL:  Patient will meet greater than or equal to 90% of their needs   MONITOR:  PO intake, Labs, Weight trends, Skin  REASON FOR ASSESSMENT:  Malnutrition Screening Tool    ASSESSMENT: 74 y.o. 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   Pt states that she used to weigh 130 lbs. Over one year ago she developed kidney problems and due to not feeling well and being hospitalized she started eating poorly (about 25% compared to usual) and lost down to 95 lbs. She states her appetite has been better recently and she has gained 2 lbs. She was drinking Carnation instant breakfast every morning PTA in addition to eating 3 meals. She reports eating 75% of her breakfast this morning (per nursing notes, pt ate 25% of breakfast). Pt is underweight with moderate muscle wasting and moderate fat wasting. RD encouraged pt to continue drinking carnation instant breakfast and to snack in between meals.   Labs: low hemoglobin, elevated potassium, low calcium, low GFR  Height:  Ht Readings from Last 1 Encounters:  10/16/14 5\' 3"  (1.6 m)    Weight:  Wt Readings from Last 1 Encounters:  10/17/14 97 lb (44 kg)    Ideal Body Weight:  52.3 kg  Wt Readings from Last 10 Encounters:  10/17/14 97 lb (44 kg)  07/28/14 98 lb (44.453 kg)  06/16/14 98 lb 14.4 oz (44.861 kg)  05/25/09 131 lb (59.421 kg)    BMI:  Body mass index is 17.19 kg/(m^2).  (Underweight)  Estimated Nutritional Needs:  Kcal:  1500-1700  Protein:  55-65 grams  Fluid:  1.3-1.5 L/day  Skin:   (Cellulitis on right groin)  Diet Order:    Heart Healthy Diet  EDUCATION NEEDS:  No education needs identified at this time   Intake/Output Summary (Last 24 hours) at 10/17/14 1032 Last data filed at 10/17/14 0915  Gross per 24 hour  Intake 1921.67 ml  Output    501 ml  Net 1420.67 ml    Last BM:  10/16/14   Ian Malkineanne Barnett RD, LDN Inpatient Clinical Dietitian Pager: [phone] After Hours Pager: [phone]

## 2014-10-17 NOTE — Progress Notes (Signed)
Hypoglycemic Event  CBG: 57  Treatment: 15 GM carbohydrate snack  Symptoms: asymptomatic  Follow-up CBG: Time: 0607 CBG Result: 54  Possible Reasons for Event: Inadequate meal intake  Comments/MD notified:     Herminio HeadsYnot, Leronda Lewers L  Remember to initiate Hypoglycemia Order Set & complete

## 2014-10-17 NOTE — Progress Notes (Signed)
PT Cancellation Note  Patient Details Name: Deanna ShortsLinda T Siebels MRN: [id number] DOB: 15-May-1941   Cancelled Treatment:    Reason Eval/Treat Not Completed: PT screened, no needs identified, will sign off   Tawni MillersWhite, Ayonna Speranza F 10/17/2014, 2:27 PM  Nija Koopman,PT Acute Rehabilitation [phone] (pager)

## 2014-10-17 NOTE — Evaluation (Signed)
Occupational Therapy Evaluation Patient Details Name: [name] MRN: [id number] DOB: 1940-08-09 Today's Date: 10/17/2014    History of Present Illness This 74 y.o female admitted with cellulitis over her mons pubis and subsequent sepsis.  PMH includes ESRD with kidney transplant 2004, HTN, DM, PNA    Clinical Impression   Patient evaluated by Occupational Therapy with no further acute OT needs identified. All education has been completed and the patient has no further questions. Pt is independent with ADLs.   See below for any follow-up Occupational Therapy or equipment needs. OT is signing off. Thank you for this referral.      Follow Up Recommendations  No OT follow up    Equipment Recommendations  None recommended by OT    Recommendations for Other Services       Precautions / Restrictions Precautions Precautions: None      Mobility Bed Mobility Overal bed mobility: Independent                Transfers Overall transfer level: Independent Equipment used: None                  Balance Overall balance assessment: Independent                                          ADL Overall ADL's : Independent                                       General ADL Comments: Pt performed similated shower in standing, able to acces cabinets, drawers, and retrieve items from floor with no LOB or dyspnea      Vision     Perception     Praxis      Pertinent Vitals/Pain Pain Assessment: No/denies pain     Hand Dominance Right   Extremity/Trunk Assessment Upper Extremity Assessment Upper Extremity Assessment: Overall WFL for tasks assessed   Lower Extremity Assessment Lower Extremity Assessment: Overall WFL for tasks assessed   Cervical / Trunk Assessment Cervical / Trunk Assessment: Normal   Communication Communication Communication: No difficulties   Cognition Arousal/Alertness: Awake/alert Behavior During  Therapy: WFL for tasks assessed/performed Overall Cognitive Status: Within Functional Limits for tasks assessed                     General Comments       Exercises       Shoulder Instructions      Home Living Family/patient expects to be discharged to:: Private residence Living Arrangements: Alone Available Help at Discharge: Family;Available PRN/intermittently Type of Home: House Home Access: Stairs to enter [employer] of Steps: 1   Home Layout: One level     Bathroom Shower/Tub: Tub/shower unit Shower/tub characteristics: Curtain Firefighter: Handicapped height     Home Equipment: Grab bars - toilet;Grab bars - tub/shower          Prior Functioning/Environment Level of Independence: Independent             OT Diagnosis:     OT Problem List:     OT Treatment/Interventions:      OT Goals(Current goals can be found in the care plan section) Acute Rehab OT Goals OT Goal Formulation: All assessment and education complete, DC therapy  OT Frequency:  Barriers to D/C:            Co-evaluation              End of Session Nurse Communication: Mobility status  Activity Tolerance: Patient tolerated treatment well Patient left: in bed;with call bell/phone within reach   Time: 1358-1414 OT Time Calculation (min): 16 min Charges:  OT General Charges $OT Visit: 1 Procedure OT Evaluation $Initial OT Evaluation Tier I: 1 Procedure G-Codes:    Dash Cardarelli, Ursula AlertWendi M 10/17/2014, 2:26 PM

## 2014-10-18 ENCOUNTER — Inpatient Hospital Stay (HOSPITAL_COMMUNITY): Payer: Medicare Other

## 2014-10-18 DIAGNOSIS — R079 Chest pain, unspecified: Secondary | ICD-10-CM

## 2014-10-18 DIAGNOSIS — Z940 Kidney transplant status: Secondary | ICD-10-CM

## 2014-10-18 DIAGNOSIS — R0789 Other chest pain: Secondary | ICD-10-CM

## 2014-10-18 DIAGNOSIS — I471 Supraventricular tachycardia: Secondary | ICD-10-CM | POA: Clinically undetermined

## 2014-10-18 DIAGNOSIS — R112 Nausea with vomiting, unspecified: Secondary | ICD-10-CM | POA: Diagnosis not present

## 2014-10-18 DIAGNOSIS — R0602 Shortness of breath: Secondary | ICD-10-CM | POA: Clinically undetermined

## 2014-10-18 DIAGNOSIS — I248 Other forms of acute ischemic heart disease: Secondary | ICD-10-CM | POA: Clinically undetermined

## 2014-10-18 DIAGNOSIS — L089 Local infection of the skin and subcutaneous tissue, unspecified: Secondary | ICD-10-CM

## 2014-10-18 LAB — GLUCOSE, CAPILLARY
GLUCOSE-CAPILLARY: 151 mg/dL — AB (ref 65–99)
Glucose-Capillary: 128 mg/dL — ABNORMAL HIGH (ref 65–99)
Glucose-Capillary: 180 mg/dL — ABNORMAL HIGH (ref 65–99)
Glucose-Capillary: 158 mg/dL — ABNORMAL HIGH (ref 65–99)

## 2014-10-18 LAB — RENAL FUNCTION PANEL
ALBUMIN: 2.9 g/dL — AB (ref 3.5–5.0)
Anion gap: 9 (ref 5–15)
BUN: 38 mg/dL — ABNORMAL HIGH (ref 6–20)
CALCIUM: 8 mg/dL — AB (ref 8.9–10.3)
CO2: 15 mmol/L — AB (ref 22–32)
Chloride: 113 mmol/L — ABNORMAL HIGH (ref 101–111)
Creatinine, Ser: 2.3 mg/dL — ABNORMAL HIGH (ref 0.44–1.00)
GFR, EST AFRICAN AMERICAN: 23 mL/min — AB (ref >60)
GFR, EST NON AFRICAN AMERICAN: 20 mL/min — AB (ref >60)
Glucose, Bld: 194 mg/dL — ABNORMAL HIGH (ref 65–99)
Phosphorus: 3.5 mg/dL (ref 2.5–4.6)
Potassium: 5.2 mmol/L — ABNORMAL HIGH (ref 3.5–5.1)
SODIUM: 137 mmol/L (ref 135–145)

## 2014-10-18 LAB — BASIC METABOLIC PANEL
Anion gap: 9 (ref 5–15)
BUN: 35 mg/dL — AB (ref 6–20)
CALCIUM: 8.4 mg/dL — AB (ref 8.9–10.3)
CHLORIDE: 111 mmol/L (ref 101–111)
CO2: 18 mmol/L — AB (ref 22–32)
Creatinine, Ser: 2.45 mg/dL — ABNORMAL HIGH (ref 0.44–1.00)
GFR calc Af Amer: 22 mL/min — ABNORMAL LOW (ref >60)
GFR, EST NON AFRICAN AMERICAN: 19 mL/min — AB (ref >60)
Glucose, Bld: 184 mg/dL — ABNORMAL HIGH (ref 65–99)
Potassium: 5 mmol/L (ref 3.5–5.1)
Sodium: 138 mmol/L (ref 135–145)

## 2014-10-18 LAB — HEMOGLOBIN A1C
HEMOGLOBIN A1C: 5.9 % — AB (ref 4.8–5.6)
Mean Plasma Glucose: 123 mg/dL

## 2014-10-18 LAB — TROPONIN I
TROPONIN I: 0.25 ng/mL — AB (ref <0.031)
TROPONIN I: 1.13 ng/mL — AB (ref <0.031)

## 2014-10-18 LAB — CBC
HCT: 22.6 % — ABNORMAL LOW (ref 36.0–46.0)
Hemoglobin: 7.4 g/dL — ABNORMAL LOW (ref 12.0–15.0)
MCH: 28.9 pg (ref 26.0–34.0)
MCHC: 32.7 g/dL (ref 30.0–36.0)
MCV: 88.3 fL (ref 78.0–100.0)
Platelets: 157 10*3/uL (ref 150–400)
RBC: 2.56 MIL/uL — ABNORMAL LOW (ref 3.87–5.11)
RDW: 15.2 % (ref 11.5–15.5)
WBC: 6.2 10*3/uL (ref 4.0–10.5)

## 2014-10-18 LAB — PREPARE RBC (CROSSMATCH)

## 2014-10-18 LAB — ABO/RH: ABO/RH(D): O POS

## 2014-10-18 LAB — MAGNESIUM: Magnesium: 1.1 mg/dL — ABNORMAL LOW (ref 1.7–2.4)

## 2014-10-18 MED ORDER — FUROSEMIDE 10 MG/ML IJ SOLN
INTRAMUSCULAR | Status: AC
  Filled 2014-10-18: qty 4

## 2014-10-18 MED ORDER — METOPROLOL TARTRATE 1 MG/ML IV SOLN
INTRAVENOUS | Status: AC
  Filled 2014-10-18: qty 5

## 2014-10-18 MED ORDER — FENTANYL CITRATE (PF) 100 MCG/2ML IJ SOLN: 25.0000 ug | INTRAMUSCULAR | Status: DC | PRN

## 2014-10-18 MED ORDER — DILTIAZEM LOAD VIA INFUSION
10.0000 mg | Freq: Once | INTRAVENOUS | Status: AC
  Administered 2014-10-18: 10 mg via INTRAVENOUS
  Filled 2014-10-18: qty 10

## 2014-10-18 MED ORDER — FENTANYL CITRATE (PF) 100 MCG/2ML IJ SOLN
25.0000 ug | INTRAMUSCULAR | Status: AC | PRN
  Administered 2014-10-18 (×2): 25 ug via INTRAVENOUS
  Filled 2014-10-18 (×2): qty 2

## 2014-10-18 MED ORDER — LEVOTHYROXINE SODIUM 25 MCG PO TABS
12.5000 ug | ORAL_TABLET | Freq: Once | ORAL | Status: AC
  Administered 2014-10-18: 12.5 ug via ORAL
  Filled 2014-10-18: qty 0.5

## 2014-10-18 MED ORDER — FENTANYL CITRATE (PF) 100 MCG/2ML IJ SOLN
25.0000 ug | Freq: Once | INTRAMUSCULAR | Status: AC
  Administered 2014-10-18: 25 ug via INTRAVENOUS
  Filled 2014-10-18: qty 2

## 2014-10-18 MED ORDER — ACETAMINOPHEN 325 MG PO TABS
650.0000 mg | ORAL_TABLET | Freq: Once | ORAL | Status: AC
  Administered 2014-10-18: 650 mg via ORAL
  Filled 2014-10-18: qty 2

## 2014-10-18 MED ORDER — SODIUM BICARBONATE 650 MG PO TABS
650.0000 mg | ORAL_TABLET | Freq: Two times a day (BID) | ORAL | Status: DC
  Administered 2014-10-18 – 2014-10-19 (×3): 650 mg via ORAL
  Filled 2014-10-18 (×4): qty 1

## 2014-10-18 MED ORDER — LEVOTHYROXINE SODIUM 75 MCG PO TABS
75.0000 ug | ORAL_TABLET | Freq: Every day | ORAL | Status: DC
  Administered 2014-10-19 – 2014-10-21 (×3): 75 ug via ORAL
  Filled 2014-10-18 (×4): qty 1

## 2014-10-18 MED ORDER — METOPROLOL TARTRATE 1 MG/ML IV SOLN
2.5000 mg | Freq: Three times a day (TID) | INTRAVENOUS | Status: DC
  Administered 2014-10-18: 2.5 mg via INTRAVENOUS
  Filled 2014-10-18: qty 5

## 2014-10-18 MED ORDER — DILTIAZEM HCL 100 MG IV SOLR
5.0000 mg/h | INTRAVENOUS | Status: DC
  Administered 2014-10-18: 10 mg/h via INTRAVENOUS
  Administered 2014-10-18: 5 mg/h via INTRAVENOUS
  Administered 2014-10-18: 15 mg/h via INTRAVENOUS
  Filled 2014-10-18 (×2): qty 100

## 2014-10-18 MED ORDER — SODIUM CHLORIDE 0.9 % IV SOLN
Freq: Once | INTRAVENOUS | Status: AC
  Administered 2014-10-18: 15:00:00 via INTRAVENOUS

## 2014-10-18 MED ORDER — NITROGLYCERIN 0.4 MG SL SUBL
0.4000 mg | SUBLINGUAL_TABLET | SUBLINGUAL | Status: DC | PRN
  Administered 2014-10-18: 0.4 mg via SUBLINGUAL
  Filled 2014-10-18: qty 1

## 2014-10-18 MED ORDER — METOCLOPRAMIDE HCL 5 MG/ML IJ SOLN
10.0000 mg | Freq: Once | INTRAMUSCULAR | Status: AC
  Administered 2014-10-18: 10 mg via INTRAVENOUS
  Filled 2014-10-18: qty 2

## 2014-10-18 MED ORDER — DIPHENHYDRAMINE HCL 25 MG PO CAPS
25.0000 mg | ORAL_CAPSULE | Freq: Once | ORAL | Status: AC
  Administered 2014-10-18: 25 mg via ORAL

## 2014-10-18 MED ORDER — DIPHENHYDRAMINE HCL 25 MG PO CAPS: 25.0000 mg | ORAL_CAPSULE | Freq: Four times a day (QID) | ORAL | Status: DC | PRN

## 2014-10-18 MED ORDER — DEXTROSE 5 % IV SOLN: 5.0000 mg/h | INTRAVENOUS | Status: DC

## 2014-10-18 MED ORDER — FUROSEMIDE 10 MG/ML IJ SOLN
20.0000 mg | Freq: Once | INTRAMUSCULAR | Status: AC
  Administered 2014-10-18: 20 mg via INTRAVENOUS

## 2014-10-18 MED ORDER — NITROGLYCERIN 0.3 MG SL SUBL
0.3000 mg | SUBLINGUAL_TABLET | SUBLINGUAL | Status: DC | PRN
  Filled 2014-10-18: qty 100

## 2014-10-18 MED ORDER — METOPROLOL TARTRATE 1 MG/ML IV SOLN
5.0000 mg | Freq: Once | INTRAVENOUS | Status: AC
  Administered 2014-10-18: 5 mg via INTRAVENOUS

## 2014-10-18 MED ORDER — FUROSEMIDE 10 MG/ML IJ SOLN
20.0000 mg | Freq: Once | INTRAMUSCULAR | Status: DC
  Filled 2014-10-18: qty 2

## 2014-10-18 MED ORDER — DIPHENHYDRAMINE HCL 25 MG PO CAPS
25.0000 mg | ORAL_CAPSULE | Freq: Once | ORAL | Status: DC
  Filled 2014-10-18: qty 1

## 2014-10-18 MED ORDER — MAGNESIUM SULFATE 4 GM/100ML IV SOLN
4.0000 g | Freq: Once | INTRAVENOUS | Status: AC
  Administered 2014-10-18: 4 g via INTRAVENOUS
  Filled 2014-10-18: qty 100

## 2014-10-18 MED ORDER — METOPROLOL TARTRATE 50 MG PO TABS
50.0000 mg | ORAL_TABLET | Freq: Two times a day (BID) | ORAL | Status: DC
  Filled 2014-10-18 (×2): qty 1

## 2014-10-18 MED ORDER — FUROSEMIDE 10 MG/ML IJ SOLN
40.0000 mg | Freq: Two times a day (BID) | INTRAMUSCULAR | Status: DC
  Administered 2014-10-18: 40 mg via INTRAVENOUS

## 2014-10-18 NOTE — Progress Notes (Signed)
Patient ID: [name], female   DOB: Mar 07, 1941, 74 y.o.   MRN: [id number]    Primary cardiologist: Consulting cardiologist:  Clinical Summary Ms. Sanville is a 74 y.o.female hx of HTN, HL, GERD, hypothyroid, prior kidney transplant on chronic immunosuppression admitted with cellulitis. During admission had episode of chest pain and we were consulted. She reports feeling well up until this morning when she had sudden onset of sharp pain between shoulder blades radiating to midchest, + palpitations and SOB.    Cr 2.30, BUN 38, K 5.2, Hgb 7.4 (trended down from 9),  Trop 0.25,  EKG MAT, lateral precorial lead ST depression    Allergies  Allergen Reactions  . Codeine Nausea And Vomiting  . Elavil [Amitriptyline] Other (See Comments)    unknown  . Iodinated Diagnostic Agents      Renal transplant pt  . Lipitor [Atorvastatin]   . Magnesium Hives    Pt tolerated oral and IV during 06/2014 admission   . Metoclopramide     Other reaction(s): Other (See Comments) "doesn't work"  . Morphine     Other reaction(s): Confusion (intolerance)  . Morphine And Related   . Neurontin [Gabapentin]   . Niacin And Related   . Norvasc [Amlodipine Besylate]   . Talwin [Pentazocine]   . Ultram [Tramadol]     Medications Scheduled Medications: . acidophilus  1 capsule Oral Daily  . aspirin  81 mg Oral QHS  . clonazePAM  1 mg Oral QHS  . darbepoetin (ARANESP) injection - NON-DIALYSIS  60 mcg Subcutaneous Q Fri-1800  . ferrous gluconate  324 mg Oral Q breakfast  . fluconazole  100 mg Oral Daily  . furosemide      . furosemide  40 mg Intravenous BID  . heparin  5,000 Units Subcutaneous 3 times per day  . insulin aspart  0-9 Units Subcutaneous TID WC  . levothyroxine  12.5 mcg Oral Once  . [START ON 10/19/2014] levothyroxine  75 mcg Oral QAC breakfast  . metoprolol  2.5 mg Intravenous 3 times per day  . multivitamin with minerals  1 tablet Oral Daily  . mycophenolate  250 mg Oral BID  .  pantoprazole  40 mg Oral Daily  . piperacillin-tazobactam (ZOSYN)  IV  2.25 g Intravenous Q6H  . sodium bicarbonate  650 mg Oral BID  . sodium chloride  3 mL Intravenous Q12H  . tacrolimus  0.5 mg Oral QHS  . tacrolimus  1 mg Oral Daily  . vancomycin  500 mg Intravenous Q48H     Infusions:     PRN Medications:  HYDROcodone-acetaminophen, hyoscyamine, nitroGLYCERIN, ondansetron **OR** ondansetron (ZOFRAN) IV, traMADol   Past Medical History  Diagnosis Date  . Hypertension   . Anemia   . GERD (gastroesophageal reflux disease)   . Hypothyroidism   . Family history of adverse reaction to anesthesia     "my daughter; they thought she was asleep and she wasn't"  . Hyperlipemia   . Pneumonia "1-2 times"  . Type II diabetes mellitus   . History of blood transfusion "several"    "all related to anemia"  . Arthritis     "knees" (10/16/2014)  . Chronic kidney disease     h/o transplant  . Frequent UTI   . Carotid artery stenosis     Past Surgical History  Procedure Laterality Date  . Kidney transplant Right 2004    at [facility]  . Kidney surgery      multiple  . Thyroidectomy  1/2 removed  . Back surgery    . Tonsillectomy    . Appendectomy    . Cholecystectomy open    . Lumbar disc surgery      "L5"  . Abdominal hysterectomy    . Tubal ligation      Family History  Problem Relation Age of Onset  . Diabetes Mother   . Hypertension Mother   . Heart disease Father   . Varicose Veins Father   . Hypertension Sister   . Cancer Daughter   . Hypertension Son     Social History Ms. Tiedeman reports that she has never smoked. She has never used smokeless tobacco. Ms. Cuttino reports that she does not drink alcohol.  Review of Systems CONSTITUTIONAL: No weight loss, fever, chills, weakness or fatigue.  HEENT: Eyes: No visual loss, blurred vision, double vision or yellow sclerae. No hearing loss, sneezing, congestion, runny nose or sore throat.  SKIN: No rash or  itching.  CARDIOVASCULAR: per HPI RESPIRATORY: No shortness of breath, cough or sputum.  GASTROINTESTINAL: No anorexia, nausea, vomiting or diarrhea. No abdominal pain or blood.  GENITOURINARY: no polyuria, no dysuria NEUROLOGICAL: No headache, dizziness, syncope, paralysis, ataxia, numbness or tingling in the extremities. No change in bowel or bladder control.  MUSCULOSKELETAL: No muscle, back pain, joint pain or stiffness.  HEMATOLOGIC: No anemia, bleeding or bruising.  LYMPHATICS: No enlarged nodes. No history of splenectomy.  PSYCHIATRIC: No history of depression or anxiety.      Physical Examination Blood pressure 112/63, pulse 140, temperature 100.2 F (37.9 C), temperature source Axillary, resp. rate 22, height  (1.6 m), weight 97 lb 0.7 oz (44.02 kg), SpO2 100 %.  Intake/Output Summary (Last 24 hours) at 10/18/14 1345 Last data filed at 10/18/14 1303  Gross per 24 hour  Intake   2020 ml  Output   1001 ml  Net   1019 ml    HEENT: sclera clear  Cardiovascular: irreg, no m/r/g, no JVD  Respiratory: CTAB  GI: abdomen soft,NT, ND  MSK: no LE edema  Neuro: no focal deficits.    Lab Results  Basic Metabolic Panel:  Recent Labs Lab 10/16/14 1635 10/16/14 2035 10/17/14 0526 10/17/14 0940 10/18/14 0332  NA 137 137 140  --  137  K 6.0* 3.9  3.8 5.6* 4.7 5.2*  CL 113* 113* 117*  --  113*  CO2 16* 13* 18*  --  15*  GLUCOSE 103* 127* 81  --  194*  BUN 71* 66* 50*  --  38*  CREATININE 2.79* 2.65* 2.19*  --  2.30*  CALCIUM 8.9 8.5* 7.6*  --  8.0*  PHOS  --   --   --   --  3.5    Liver Function Tests:  Recent Labs Lab 10/16/14 1635 10/18/14 0332  AST 12*  --   ALT 11*  --   ALKPHOS 72  --   BILITOT 0.1*  --   PROT 6.6  --   ALBUMIN 3.7 2.9*    CBC:  Recent Labs Lab 10/16/14 1635 10/17/14 0526 10/17/14 0940 10/18/14 0332  WBC 4.6 4.1  --  6.2  NEUTROABS 3.2  --   --   --   HGB 9.0* 6.9* 7.1* 7.4*  HCT 27.8* 21.0* 22.0* 22.6*  MCV  88.5 87.9  --  88.3  PLT 175 137*  --  157    Cardiac Enzymes:  Recent Labs Lab 10/16/14 2035 10/18/14 1127  TROPONINI <0.03 0.25*  BNP: Invalid input(s): POCBNP    Impression.Recommendations  1. Demand ischemia - sudden onset of chest pain and palpitations this AM, EKG and tele show MAT - suspect demand ischemia due to tachcyardia in setting of significant anemia and Hgb of 7 - will start dilt gtt to control rates, primary team to transfuse - due to her anemia would avoid heparin at this time, main focus is to lower demand with rate control and increase oxygen supply with blood transfusions - once optimized, may consider stress testing however limited on any invasive testing due to her significant renal dysfunction.   2. MAT - starting dilt gtt. K is high normal, will add on Mg level.   Dina RichJonathan Glenyce Randle, M.D.,

## 2014-10-18 NOTE — Progress Notes (Signed)
[facility] PROGRESS NOTE  [name] ZOX:[id number] DOB: 1941/04/08 DOA: 10/16/2014 PCP: Samuel Jester, DO  Assessment/Plan: #1 chest pain/tachycardia/nausea vomiting Patient with complaints of chest pain with a chest heaviness noted to also be tachycardic with nausea and vomiting. Unknown etiology. Patient with multiple risk factors of diabetes, chronic kidney disease, hypertension, hyperlipidemia. Will check an acute abdominal series. EKG was done which shows some ST depression in leads 2, aVF, V4 through V6. Will cycle cardiac enzymes every 6 hours 3. Check a 2-D echo. Will place patient on IV Lopressor. Given a dose of IV Reglan 1. Consult with cardiology for further evaluation and management.   #2 cellulitis of mons pubis with sepsis ?? etiology. Patient with clinical improvement. Currently afebrile. Patient immunosuppressed. Monitor for abscess formation. Continue empiric IV vancomycin and IV Zosyn. Follow.  #3 acute on chronic kidney disease stage III Likely secondary to prerenal azotemia as patient is noted to be hypotensive and also septic. Patient's blood pressure responded to IV fluids. Renal transplant ultrasound with normal-appearing right renal transplant with normal perfusion. Creatinine still elevated at 2.30. Tacrolimus levels pending. Continue CellCept and Prograf. NSL IV fluids. Nephrology following.   #4 hyperkalemia Likely secondary to problem #2. Will change diet to a renal diet. Follow.  #5 anemia Likely anemia of chronic disease. Anemia panel consistent with anemia of chronic disease. Hemoglobin currently at 7.4. Transfusion threshold hemoglobin less than 7. Follow. Renal following.  #6 hypotension Likely secondary to problem #1. Blood pressure responded some to IV fluids. Saline lock IV fluids. Continue empiric IV antibiotics. Follow.  #7 well-controlled diabetes mellitus Hemoglobin A1c is 5.9. pending. CBGs have ranged from 97-180. Continue Lantus at  6 units daily. Sliding scale insulin.  #8 hypothyroidism TSH decreased at 0.046. Free T4 within normal limits. Will place patient back on a home regimen of Synthroid at . Will likely need repeat thyroid function studies done in about 4-6 weeks.  #9 gastroesophageal reflux disease PPI.  #10 hypertension Patient now tachycardic. Blood pressure slightly improved however still soft. Will place patient on IV metoprolol.  #11 moderate protein calorie malnutrition Continue nutritional supplements.  #12 prophylaxis Heparin for DVT prophylaxis.  Code Status: Full Family Communication: Updated patient and sister at bedside. Disposition Plan: Home when medically stable.   Consultants:  Nephrology: Dr. Allena Katz 10/17/2014  Procedures:  Renal transplant ultrasound 10/17/2014   Chest x-ray 10/16/2014    Antibiotics:  IV vancomycin 10/16/2014  IV Zosyn 10/16/2014  HPI/Subjective: Per nursing patient with nausea vomiting with complaints of chest pain which started last night and has been intermittent with some associated shortness of breath. Patient states chest pain feels like a pressure and ongoing since last night and also in the emergency room on admission when she was given a nebulizer treatment. Patient was also complains of pain between the shoulder blades. Patient actively vomiting in the room.  Objective: Filed Vitals:   10/18/14 1120  BP: 124/57  Pulse: 116  Temp:   Resp: 22    Intake/Output Summary (Last 24 hours) at 10/18/14 1159 Last data filed at 10/18/14 1013  Gross per 24 hour  Intake   2020 ml  Output   1001 ml  Net   1019 ml   Filed Weights   10/16/14 2140 10/17/14 0709 10/18/14 0505  Weight: 44.044 kg (97 lb 1.6 oz) 44 kg (97 lb) 44.02 kg (97 lb 0.7 oz)    Exam:   General:  NAD  Cardiovascular: Tachycardic  Respiratory: Rhonchorous coarse breath  sounds  Abdomen: Soft, nontender, nondistended, positive bowel sounds.  Musculoskeletal: No  cyanosis or edema.   Skin: Circular lesion over the right mons pubis less erythematous, nontender to palpation, approximately 2 cm in size.   Data Reviewed: Basic Metabolic Panel:  Recent Labs Lab 10/16/14 1635 10/16/14 2035 10/17/14 0526 10/17/14 0940 10/18/14 0332  NA 137 137 140  --  137  K 6.0* 3.9  3.8 5.6* 4.7 5.2*  CL 113* 113* 117*  --  113*  CO2 16* 13* 18*  --  15*  GLUCOSE 103* 127* 81  --  194*  BUN 71* 66* 50*  --  38*  CREATININE 2.79* 2.65* 2.19*  --  2.30*  CALCIUM 8.9 8.5* 7.6*  --  8.0*  PHOS  --   --   --   --  3.5   Liver Function Tests:  Recent Labs Lab 10/16/14 1635 10/18/14 0332  AST 12*  --   ALT 11*  --   ALKPHOS 72  --   BILITOT 0.1*  --   PROT 6.6  --   ALBUMIN 3.7 2.9*   No results for input(s): LIPASE, AMYLASE in the last 168 hours. No results for input(s): AMMONIA in the last 168 hours. CBC:  Recent Labs Lab 10/16/14 1635 10/17/14 0526 10/17/14 0940 10/18/14 0332  WBC 4.6 4.1  --  6.2  NEUTROABS 3.2  --   --   --   HGB 9.0* 6.9* 7.1* 7.4*  HCT 27.8* 21.0* 22.0* 22.6*  MCV 88.5 87.9  --  88.3  PLT 175 137*  --  157   Cardiac Enzymes:  Recent Labs Lab 10/16/14 2035  TROPONINI <0.03   BNP (last 3 results) No results for input(s): BNP in the last 8760 hours.  ProBNP (last 3 results) No results for input(s): PROBNP in the last 8760 hours.  CBG:  Recent Labs Lab 10/17/14 1746 10/17/14 1843 10/17/14 2114 10/18/14 0603 10/18/14 1126  GLUCAP 66 87 97 128* 180*    Recent Results (from the past 240 hour(s))  Culture, blood (routine x 2)     Status: None (Preliminary result)   Collection Time: 10/16/14  8:21 PM  Result Value Ref Range Status   Specimen Description BLOOD RIGHT FOREARM  Final   Special Requests BOTTLES DRAWN AEROBIC AND ANAEROBIC 10CC EACH  Final   Culture   Final           BLOOD CULTURE RECEIVED NO GROWTH TO DATE CULTURE WILL BE HELD FOR 5 DAYS BEFORE ISSUING A FINAL NEGATIVE REPORT Note: Culture  results may be compromised due to an excessive volume of blood received in culture bottles. Performed at [facility]    Report Status PENDING  Incomplete  Culture, blood (routine x 2)     Status: None (Preliminary result)   Collection Time: 10/16/14  8:35 PM  Result Value Ref Range Status   Specimen Description BLOOD LEFT FOREARM  Final   Special Requests BOTTLES DRAWN AEROBIC AND ANAEROBIC 10CC EACH  Final   Culture   Final           BLOOD CULTURE RECEIVED NO GROWTH TO DATE CULTURE WILL BE HELD FOR 5 DAYS BEFORE ISSUING A FINAL NEGATIVE REPORT Note: Culture results may be compromised due to an excessive volume of blood received in culture bottles. Performed at [facility]    Report Status PENDING  Incomplete  Urine culture     Status: None   Collection Time: 10/16/14  8:59 PM  Result Value Ref Range Status   Specimen Description URINE, CLEAN CATCH  Final   Special Requests NONE  Final   Colony Count NO GROWTH Performed at [facility]   Final   Culture NO GROWTH Performed at [facility]   Final   Report Status 10/17/2014 FINAL  Final     Studies: US Renal Transplant W/doppler  10/17/2014   CLINICAL DATA:  Acute renal failure.  EXAM: ULTRASOUND OF RENAL TRANSPLANT WITH DOPPLER  TECHNIQUE: Ultrasound examination of the renal transplant was performed with gray-scale, color and duplex doppler evaluation.  COMPARISON:  02/12/2010  FINDINGS: Transplant kidney location:  Right lower quadrant.  Transplant kidney:  Length: 10.8 cm. Normal in size and parenchymal echogenicity. No evidence of mass or hydronephrosis.  Color flow in the main renal artery:  Present  Color flow in the main renal vein:  Present  Duplex Doppler Evaluation  Resistive index in main renal artery: 0.93  Venous waveform in main renal vein:  Present  Resistive index in upper pole intrarenal artery: 0.76  Resistive index in lower pole intrarenal artery: 0.71  Bladder: Normal for degree of  bladder distention.  Other findings: Right ureteral jet identified. Transplant bed appears normal.  IMPRESSION: Normal appearing right renal transplant with normal perfusion.   Electronically Signed   By: Francene Boyers M.D.   On: 10/17/2014 18:12   Dg Chest Port 1 View  10/16/2014   CLINICAL DATA:  Abscess.  UTI for 3 days.  EXAM: PORTABLE CHEST - 1 VIEW  COMPARISON:  None.  FINDINGS: The heart size and mediastinal contours are within normal limits. Both lungs are clear. The visualized skeletal structures are unremarkable.  IMPRESSION: No active disease.   Electronically Signed   By: Signa Kell M.D.   On: 10/16/2014 21:09    Scheduled Meds: . acidophilus  1 capsule Oral Daily  . aspirin  81 mg Oral QHS  . clonazePAM  1 mg Oral QHS  . darbepoetin (ARANESP) injection - NON-DIALYSIS  60 mcg Subcutaneous Q Fri-1800  . ferrous gluconate  324 mg Oral Q breakfast  . fluconazole  100 mg Oral Daily  . heparin  5,000 Units Subcutaneous 3 times per day  . insulin aspart  0-9 Units Subcutaneous TID WC  . levothyroxine  12.5 mcg Oral Once  . [START ON 10/19/2014] levothyroxine  75 mcg Oral QAC breakfast  . metoprolol  2.5 mg Intravenous 3 times per day  . multivitamin with minerals  1 tablet Oral Daily  . mycophenolate  250 mg Oral BID  . pantoprazole  40 mg Oral Daily  . piperacillin-tazobactam (ZOSYN)  IV  2.25 g Intravenous Q6H  . sodium bicarbonate  650 mg Oral BID  . sodium chloride  3 mL Intravenous Q12H  . tacrolimus  0.5 mg Oral QHS  . tacrolimus  1 mg Oral Daily  . vancomycin  500 mg Intravenous Q48H   Continuous Infusions:    Principal Problem:   Cellulitis of right groin Active Problems:   Chest pain   Sepsis   Anemia   Essential hypertension   GERD (gastroesophageal reflux disease)   Hypothyroidism   UTI (lower urinary tract infection)   Carotid artery stenosis   Malnutrition of moderate degree   ARF (acute renal failure)   Nausea with vomiting   SOB (shortness of  breath)   Pain in the chest   Skin infection    Time spent: 40 mins  Bayhealth Milford Memorial HospitalHOMPSON,DANIEL MD [facility] Pager [phone]. If 7PM-7AM, please contact night-coverage at [email or web address], password [email or web address] 10/18/2014, 11:59 AM  LOS: 2 days

## 2014-10-18 NOTE — Progress Notes (Signed)
Pt c/o pain in between the shoulder blade with 10/10 sharp pain with no relief after giving Tramadol, now radiating the pain through her chest with 10/10 pressure pain, VS taken to chart, EKG done, Hydrocodone 2 tab given and M. Lynch was notified and orderd for 1 time dose of 25mcg Fentanyl IV, will continue to monitor pt   10/17/14 2356  Vitals  BP (!) 144/69 mmHg  BP Location Left Arm  Pulse Rate (!) 105  Pulse Rate Source Dinamap  Resp 20  Oxygen Therapy  SpO2 100 %  O2 Device Room Air  Pain Assessment  Pain Assessment 0-10  Pain Score 10  Faces Pain Scale 8  Pain Type Acute pain  Pain Location Back (in bet shouklder blade)  Pain Descriptors / Indicators Sharp  Pain Onset On-going  Patients Stated Pain Goal 0  Pain Intervention(s) Medication (See eMAR);MD notified (Comment)  Multiple Pain Sites Yes  2nd Pain Site  Pain Score 10  Wong-Baker Pain Rating 8  Pain Type Acute pain  Pain Location Chest  Pain Orientation Anterior  Pain Descriptors / Indicators Pressure  Pain Frequency Constant  Pain Onset On-going  Patient's Stated Pain Goal 0  Pain Intervention(s) MD notified (Comment);Medication (See eMAR)

## 2014-10-18 NOTE — Progress Notes (Signed)
Patient ID: SADIE HAZELETT, female   DOB: 12/30/1940, 74 y.o.   MRN: [id number]  [facility] Progress Note    Assessment/ Plan:   1. Acute renal failure on chronic kidney disease stage III T: Appears to be primarily hemodynamically mediated with features of sepsis/hypotension. Renal function stable and it appears she has tipped over into volume overload this morning- agree with stopping fluids and giving IV lasix (ordered  X 2 doses today)- this will also help treat her borderline hyperkalemia. 2. Cellulitis of mons pubis: Improving clinically on broad-spectrum antibiotic therapy with vancomycin and Zosyn in this immunosuppressed patient, no evidence of abscess formation. 3. Anemia: Borderline Tsat- start ESA 4. Diabetes mellitus: Glycemic control per primary service-suspect some difficulties in the face of infection 5. Hypotension: Anti-hypertensive therapy on hold, managing underlying infection/sepsis  Subjective:   Developed chest tightness that was seemingly  radiating to her back "between my shoulder blades" as well as some shortness of breath and nausea/vomiting.   Objective:   BP 121/66 mmHg  Pulse 142  Temp(Src) 99.8 F (37.7 C) (Oral)  Resp 22  Ht  (1.6 m)  Wt 44.02 kg (97 lb 0.7 oz)  BMI 17.20 kg/m2  SpO2 91%  Intake/Output Summary (Last 24 hours) at 10/18/14 1104 Last data filed at 10/18/14 1013  Gross per 24 hour  Intake   2020 ml  Output   1501 ml  Net    519 ml   Weight change: -0.815 kg (-1 lb 12.8 oz)  Physical Exam: WUJ:[id number] resting in bed ZHY:QMVHQ regular tachycardia, s1 and s2 without gallop Resp:Coarse rales with intermittent rhonchi audible ION:GEXB, flat, NT Ext:No LE edema  Imaging: US Renal Transplant W/doppler  10/17/2014   CLINICAL DATA:  Acute renal failure.  EXAM: ULTRASOUND OF RENAL TRANSPLANT WITH DOPPLER  TECHNIQUE: Ultrasound examination of the renal transplant was performed with gray-scale, color and duplex  doppler evaluation.  COMPARISON:  02/12/2010  FINDINGS: Transplant kidney location:  Right lower quadrant.  Transplant kidney:  Length: 10.8 cm. Normal in size and parenchymal echogenicity. No evidence of mass or hydronephrosis.  Color flow in the main renal artery:  Present  Color flow in the main renal vein:  Present  Duplex Doppler Evaluation  Resistive index in main renal artery: 0.93  Venous waveform in main renal vein:  Present  Resistive index in upper pole intrarenal artery: 0.76  Resistive index in lower pole intrarenal artery: 0.71  Bladder: Normal for degree of bladder distention.  Other findings: Right ureteral jet identified. Transplant bed appears normal.  IMPRESSION: Normal appearing right renal transplant with normal perfusion.   Electronically Signed   By: Francene Boyers M.D.   On: 10/17/2014 18:12   Dg Chest Port 1 View  10/16/2014   CLINICAL DATA:  Abscess.  UTI for 3 days.  EXAM: PORTABLE CHEST - 1 VIEW  COMPARISON:  None.  FINDINGS: The heart size and mediastinal contours are within normal limits. Both lungs are clear. The visualized skeletal structures are unremarkable.  IMPRESSION: No active disease.   Electronically Signed   By: Signa Kell M.D.   On: 10/16/2014 21:09    Labs: BMET  Recent Labs Lab 10/16/14 1635 10/16/14 2035 10/17/14 0526 10/17/14 0940 10/18/14 0332  NA 137 137 140  --  137  K 6.0* 3.9  3.8 5.6* 4.7 5.2*  CL 113* 113* 117*  --  113*  CO2 16* 13* 18*  --  15*  GLUCOSE 103* 127* 81  --  194*  BUN 71* 66* 50*  --  38*  CREATININE 2.79* 2.65* 2.19*  --  2.30*  CALCIUM 8.9 8.5* 7.6*  --  8.0*  PHOS  --   --   --   --  3.5   CBC  Recent Labs Lab 10/16/14 1635 10/17/14 0526 10/17/14 0940 10/18/14 0332  WBC 4.6 4.1  --  6.2  NEUTROABS 3.2  --   --   --   HGB 9.0* 6.9* 7.1* 7.4*  HCT 27.8* 21.0* 22.0* 22.6*  MCV 88.5 87.9  --  88.3  PLT 175 137*  --  157    Medications:    . acidophilus  1 capsule Oral Daily  . aspirin  81 mg Oral QHS   . clonazePAM  1 mg Oral QHS  . darbepoetin (ARANESP) injection - NON-DIALYSIS  60 mcg Subcutaneous Q Fri-1800  . ferrous gluconate  324 mg Oral Q breakfast  . fluconazole  100 mg Oral Daily  . heparin  5,000 Units Subcutaneous 3 times per day  . insulin aspart  0-9 Units Subcutaneous TID WC  . levothyroxine  12.5 mcg Oral Once  . [START ON 10/19/2014] levothyroxine  75 mcg Oral QAC breakfast  . metoCLOPramide (REGLAN) injection  10 mg Intravenous Once  . metoprolol      . metoprolol  50 mg Oral BID  . multivitamin with minerals  1 tablet Oral Daily  . mycophenolate  250 mg Oral BID  . pantoprazole  40 mg Oral Daily  . piperacillin-tazobactam (ZOSYN)  IV  2.25 g Intravenous Q6H  . sodium bicarbonate  650 mg Oral BID  . sodium chloride  3 mL Intravenous Q12H  . tacrolimus  0.5 mg Oral QHS  . tacrolimus  1 mg Oral Daily  . vancomycin  500 mg Intravenous Q48H   Zetta BillsJay Shelley Cocke, MD 10/18/2014, 11:04 AM

## 2014-10-18 NOTE — Progress Notes (Signed)
Pt. c/o chest and back pain with nausea. HR 140s. Sister at the bedside pt lying in bed, pt c/o SOB. 2.5L spo2 100% see mar. MD made aware orders received.

## 2014-10-18 NOTE — Progress Notes (Signed)
CRITICAL VALUE ALERT  Critical value received:  1800  Date of notification:  10/18/14  Time of notification: 1816  Critical value read back: yes  Nurse who received alert:  Kandis Fantasiaasha Troye Hiemstra, RN    MD notified (1st page): Dr. Sullivan LoneGilbert   Time of first page:1816  MD notified (2nd page):Dr. Sullivan LoneGilbert   Time of second page:  Responding MD:  Dr. Sullivan LoneGilbert   Time MD responded: [phone]

## 2014-10-19 ENCOUNTER — Inpatient Hospital Stay (HOSPITAL_COMMUNITY): Payer: Medicare Other

## 2014-10-19 DIAGNOSIS — I471 Supraventricular tachycardia: Secondary | ICD-10-CM

## 2014-10-19 DIAGNOSIS — I248 Other forms of acute ischemic heart disease: Secondary | ICD-10-CM

## 2014-10-19 DIAGNOSIS — D649 Anemia, unspecified: Secondary | ICD-10-CM

## 2014-10-19 LAB — TYPE AND SCREEN
ABO/RH(D): O POS
ANTIBODY SCREEN: NEGATIVE
UNIT DIVISION: 0
Unit division: 0

## 2014-10-19 LAB — RENAL FUNCTION PANEL
ALBUMIN: 2.7 g/dL — AB (ref 3.5–5.0)
ANION GAP: 14 (ref 5–15)
BUN: 33 mg/dL — ABNORMAL HIGH (ref 6–20)
CHLORIDE: 108 mmol/L (ref 101–111)
CO2: 16 mmol/L — AB (ref 22–32)
CREATININE: 2.53 mg/dL — AB (ref 0.44–1.00)
Calcium: 8.3 mg/dL — ABNORMAL LOW (ref 8.9–10.3)
GFR calc non Af Amer: 18 mL/min — ABNORMAL LOW (ref >60)
GFR, EST AFRICAN AMERICAN: 21 mL/min — AB (ref >60)
GLUCOSE: 133 mg/dL — AB (ref 65–99)
POTASSIUM: 3.9 mmol/L (ref 3.5–5.1)
Phosphorus: 4 mg/dL (ref 2.5–4.6)
Sodium: 138 mmol/L (ref 135–145)

## 2014-10-19 LAB — GLUCOSE, CAPILLARY
GLUCOSE-CAPILLARY: 158 mg/dL — AB (ref 65–99)
Glucose-Capillary: 110 mg/dL — ABNORMAL HIGH (ref 65–99)
Glucose-Capillary: 155 mg/dL — ABNORMAL HIGH (ref 65–99)
Glucose-Capillary: 171 mg/dL — ABNORMAL HIGH (ref 65–99)

## 2014-10-19 LAB — TACROLIMUS LEVEL: TACROLIMUS (FK506) - LABCORP: 4.8 ng/mL (ref 2.0–20.0)

## 2014-10-19 LAB — CBC
HEMATOCRIT: 31.1 % — AB (ref 36.0–46.0)
Hemoglobin: 10.4 g/dL — ABNORMAL LOW (ref 12.0–15.0)
MCH: 27.9 pg (ref 26.0–34.0)
MCHC: 33.4 g/dL (ref 30.0–36.0)
MCV: 83.4 fL (ref 78.0–100.0)
Platelets: 132 10*3/uL — ABNORMAL LOW (ref 150–400)
RBC: 3.73 MIL/uL — AB (ref 3.87–5.11)
RDW: 16.5 % — AB (ref 11.5–15.5)
WBC: 7 10*3/uL (ref 4.0–10.5)

## 2014-10-19 LAB — TROPONIN I
TROPONIN I: 1.03 ng/mL — AB (ref <0.031)
Troponin I: 0.87 ng/mL (ref <0.031)
Troponin I: 0.85 ng/mL (ref <0.031)

## 2014-10-19 LAB — HEPARIN LEVEL (UNFRACTIONATED): Heparin Unfractionated: 0.17 IU/mL — ABNORMAL LOW (ref 0.30–0.70)

## 2014-10-19 LAB — MAGNESIUM: MAGNESIUM: 2.7 mg/dL — AB (ref 1.7–2.4)

## 2014-10-19 MED ORDER — TECHNETIUM TC 99M DIETHYLENETRIAME-PENTAACETIC ACID: 40.0000 | Freq: Once | INTRAVENOUS | Status: AC | PRN

## 2014-10-19 MED ORDER — DILTIAZEM HCL 30 MG PO TABS
30.0000 mg | ORAL_TABLET | Freq: Two times a day (BID) | ORAL | Status: DC
  Administered 2014-10-19 (×2): 30 mg via ORAL
  Filled 2014-10-19 (×4): qty 1

## 2014-10-19 MED ORDER — SODIUM BICARBONATE 650 MG PO TABS
650.0000 mg | ORAL_TABLET | Freq: Three times a day (TID) | ORAL | Status: DC
  Administered 2014-10-19 – 2014-10-20 (×5): 650 mg via ORAL
  Filled 2014-10-19 (×8): qty 1

## 2014-10-19 MED ORDER — TECHNETIUM TO 99M ALBUMIN AGGREGATED
6.0000 | Freq: Once | INTRAVENOUS | Status: AC | PRN
  Administered 2014-10-19: 6 via INTRAVENOUS

## 2014-10-19 MED ORDER — HEPARIN (PORCINE) IN NACL 100-0.45 UNIT/ML-% IJ SOLN
900.0000 [IU]/h | INTRAMUSCULAR | Status: DC
  Administered 2014-10-19: 550 [IU]/h via INTRAVENOUS
  Administered 2014-10-20: 750 [IU]/h via INTRAVENOUS
  Filled 2014-10-19 (×4): qty 250

## 2014-10-19 NOTE — Progress Notes (Addendum)
MD paged twice about BP being 80s systolic over 40s diastolic.  Patient sleeping and received 2 units PRBC.  Initially when first blood pressure received, cardizem was dropped from 15ml to 535ml/h as heart rate was then controlled.  BP continues to be soft after 2unit of blood given,  so stopped cardizem.  Will await further orders from Burnadette PeterLynch, NP.

## 2014-10-19 NOTE — Progress Notes (Signed)
Patient ID: [name], female   DOB: 01-19-1941, 74 y.o.   MRN: [id number]    Subjective:    SOB improved this AM.   Objective:   Temp:  [97.3 F (36.3 C)-100.2 F (37.9 C)] 98.1 F (36.7 C) (06/05 0431) Pulse Rate:  [84-142] 84 (06/05 0431) Resp:  [16-22] 16 (06/05 0431) BP: (82-141)/(41-66) 103/46 mmHg (06/05 0431) SpO2:  [91 %-100 %] 97 % (06/05 0431) Weight:  [101 lb 14.4 oz (46.222 kg)] 101 lb 14.4 oz (46.222 kg) (06/05 0431) Last BM Date: 10/18/14  Filed Weights   10/17/14 0709 10/18/14 0505 10/19/14 0431  Weight: 97 lb (44 kg) 97 lb 0.7 oz (44.02 kg) 101 lb 14.4 oz (46.222 kg)    Intake/Output Summary (Last 24 hours) at 10/19/14 0944 Last data filed at 10/19/14 0850  Gross per 24 hour  Intake   1455 ml  Output   1952 ml  Net   -497 ml    Telemetry: NSR  Exam:  General: NAD  Resp: CTAB  Cardiac: RRR, no m/r/g, no JVD  GI: abdomen soft, NT, ND  MSK: no LE edema  Neuro: no focal deficits   Lab Results:  Basic Metabolic Panel:  Recent Labs Lab 10/18/14 0332 10/18/14 1615 10/19/14 0526  NA 137 138 138  K 5.2* 5.0 3.9  CL 113* 111 108  CO2 15* 18* 16*  GLUCOSE 194* 184* 133*  BUN 38* 35* 33*  CREATININE 2.30* 2.45* 2.53*  CALCIUM 8.0* 8.4* 8.3*  MG  --  1.1* 2.7*    Liver Function Tests:  Recent Labs Lab 10/16/14 1635 10/18/14 0332 10/19/14 0526  AST 12*  --   --   ALT 11*  --   --   ALKPHOS 72  --   --   BILITOT 0.1*  --   --   PROT 6.6  --   --   ALBUMIN 3.7 2.9* 2.7*    CBC:  Recent Labs Lab 10/17/14 0526 10/17/14 0940 10/18/14 0332 10/19/14 0526  WBC 4.1  --  6.2 7.0  HGB 6.9* 7.1* 7.4* 10.4*  HCT 21.0* 22.0* 22.6* 31.1*  MCV 87.9  --  88.3 83.4  PLT 137*  --  157 132*    Cardiac Enzymes:  Recent Labs Lab 10/16/14 2035 10/18/14 1127 10/18/14 1615  TROPONINI <0.03 0.25* 1.13*    BNP: No results for input(s): PROBNP in the last 8760 hours.  Coagulation:  Recent Labs Lab 10/16/14 2035  INR 1.13      Medications:   Scheduled Medications: . acidophilus  1 capsule Oral Daily  . aspirin  81 mg Oral QHS  . clonazePAM  1 mg Oral QHS  . darbepoetin (ARANESP) injection - NON-DIALYSIS  60 mcg Subcutaneous Q Fri-1800  . ferrous gluconate  324 mg Oral Q breakfast  . fluconazole  100 mg Oral Daily  . furosemide  20 mg Intravenous Once  . heparin  5,000 Units Subcutaneous 3 times per day  . insulin aspart  0-9 Units Subcutaneous TID WC  . levothyroxine  75 mcg Oral QAC breakfast  . multivitamin with minerals  1 tablet Oral Daily  . mycophenolate  250 mg Oral BID  . pantoprazole  40 mg Oral Daily  . piperacillin-tazobactam (ZOSYN)  IV  2.25 g Intravenous Q6H  . sodium bicarbonate  650 mg Oral BID  . sodium chloride  3 mL Intravenous Q12H  . tacrolimus  0.5 mg Oral QHS  . tacrolimus  1 mg Oral  Daily  . vancomycin  500 mg Intravenous Q48H     Infusions: . diltiazem (CARDIZEM) infusion Stopped (10/19/14 0136)     PRN Medications:  diphenhydrAMINE, HYDROcodone-acetaminophen, hyoscyamine, nitroGLYCERIN, ondansetron **OR** ondansetron (ZOFRAN) IV, traMADol     Assessment/Plan   1. Demand ischemia - sudden onset of chest pain and palpitations yesterday, EKG and tele show MAT with elevated rates - suspect demand ischemia due to tachcyardia in setting of significant anemia and Hgb of 7 - trop up to 1.13, has not established a peak yet. Will continue cycling - started on dilt gtt, transfused by primary team yesterday. Hgb up to 10.4 today.  - did not start heparin gtt yesterday in setting of significant anemia, thought to be more tachy and anemia related as opposed to acute occlusive CAD  - with acute chest pain, SOB, trop elevation, and acute  MAT/tachycardia in hospitalized patient would question possible PE. Recommend VQ scan due to her poor renal function. Her anemia is of chronic disease, not acute blood loss, now that she is has been transfused we will start heparin and follow  trop trend and VQ results.   - f/u echo results - pending echo and VQ results, consider non-invasive stress testing tomorrow. Due to CKD in current renal transplant she is poor candidate for cath.     2. MAT - started on dilt gtt yesterday, dosing has been limited by soft bp's.  - EKG this AM shows she is back in NSR, non-specific ST/T changes - will start oral dilt with hold parameters         Dina RichJonathan Branch, M.D.

## 2014-10-19 NOTE — Progress Notes (Signed)
Patient ID: Deanna ShortsLinda T Maddox, female   DOB: 03-17-1941, 74 y.o.   MRN: [id number]  [facility] Progress Note   Assessment/ Plan:   1. Acute renal failure on chronic kidney disease stage III T: Appears to be primarily hemodynamically mediated with features of sepsis/hypotension. Renal function in flux with events from overnight including hypertension/packed red cell transfusion and diuresis. No acute electrolyte abnormalities to prompt intervention at this time. Continue oral sodium bicarbonate 650 mg 3 times a day. No changes to current immunosuppressive therapy-CellCept/tacrolimus  2. Cellulitis of mons pubis: Improving clinically on broad-spectrum antibiotic therapy with vancomycin and Zosyn in this immunosuppressed patient, no evidence of abscess formation. 3. Anemia: Borderline Tsat- started on Aranesp and blood transfusion given yesterday (irradiated PRBCs)  4. Diabetes mellitus: Glycemic control per primary service-suspect some difficulties in the face of infection 5. Hypotension: Anti-hypertensive therapy on hold, managing underlying infection/sepsis 6. Chest pain: Appears to be from demand ischemia with anemia/multifocal atrial tachycardia-improved with blood transfusion/diuresis; diltiazem drip weaned off because of hypotension.  Subjective:   Feeling better compared to yesterday-reports that shortness of breath improved after furosemide and she feels better after blood transfusion.    Objective:   BP 103/46 mmHg  Pulse 84  Temp(Src) 98.1 F (36.7 C) (Oral)  Resp 16  Ht 5\' 3"  (1.6 m)  Wt 46.222 kg (101 lb 14.4 oz)  BMI 18.06 kg/m2  SpO2 97%  Intake/Output Summary (Last 24 hours) at 10/19/14 0959 Last data filed at 10/19/14 0850  Gross per 24 hour  Intake   1455 ml  Output   1952 ml  Net   -497 ml   Weight change: 2.221 kg (4 lb 14.4 oz)  Physical Exam: Gen: Comfortable resting in bed CVS: Pulse regular with intermittent tachycardia-S1 and S2 normal Resp:  Fine rales right base-no wheeze Abd: Soft, flat, nontender Ext: No lower extremity edema Examination of mons: Shallow ulcer on right side with a scab (about 1 cm in diameter), healing ulcer on left side (0.3 cm diameter). Minimal erythema around scabs-no fluctuance/abscess  Imaging: [place] Renal Transplant W/doppler  10/17/2014   CLINICAL DATA:  Acute renal failure.  EXAM: ULTRASOUND OF RENAL TRANSPLANT WITH DOPPLER  TECHNIQUE: Ultrasound examination of the renal transplant was performed with gray-scale, color and duplex doppler evaluation.  COMPARISON:  02/12/2010  FINDINGS: Transplant kidney location:  Right lower quadrant.  Transplant kidney:  Length: 10.8 cm. Normal in size and parenchymal echogenicity. No evidence of mass or hydronephrosis.  Color flow in the main renal artery:  Present  Color flow in the main renal vein:  Present  Duplex Doppler Evaluation  Resistive index in main renal artery: 0.93  Venous waveform in main renal vein:  Present  Resistive index in upper pole intrarenal artery: 0.76  Resistive index in lower pole intrarenal artery: 0.71  Bladder: Normal for degree of bladder distention.  Other findings: Right ureteral jet identified. Transplant bed appears normal.  IMPRESSION: Normal appearing right renal transplant with normal perfusion.   Electronically Signed   By: Francene BoyersJames  Maxwell M.D.   On: 10/17/2014 18:12   Dg Abd Acute W/chest  10/18/2014   CLINICAL DATA:  Short of breath, nonproductive cough. Renal transplant.  EXAM: DG ABDOMEN ACUTE W/ 1V CHEST  COMPARISON:  Radiographs 10/16/2014  FINDINGS: Mildly enlarged cardiac silhouette. There is new central airspace consolidation within the right upper lobe and right lower lobe. There is patchy airspace disease within both lungs. There is linear peripheral interstitial markings in the  right lung.  IMPRESSION: 1. Dense airspace disease in the central right upper lobe and right lower lobe representing pneumonia versus dense edema. 2. Bilateral  patchy nodular airspace disease with differential including pulmonary edema versus multifocal pneumonia. 3. Interstitial edema pattern noted particular in the right lung.   Electronically Signed   By: Genevive Bi M.D.   On: 10/18/2014 12:30    Labs: BMET  Recent Labs Lab 10/16/14 1635 10/16/14 2035 10/17/14 0526 10/17/14 0940 10/18/14 0332 10/18/14 1615 10/19/14 0526  NA 137 137 140  --  137 138 138  K 6.0* 3.9  3.8 5.6* 4.7 5.2* 5.0 3.9  CL 113* 113* 117*  --  113* 111 108  CO2 16* 13* 18*  --  15* 18* 16*  GLUCOSE 103* 127* 81  --  194* 184* 133*  BUN 71* 66* 50*  --  38* 35* 33*  CREATININE 2.79* 2.65* 2.19*  --  2.30* 2.45* 2.53*  CALCIUM 8.9 8.5* 7.6*  --  8.0* 8.4* 8.3*  PHOS  --   --   --   --  3.5  --  4.0   CBC  Recent Labs Lab 10/16/14 1635 10/17/14 0526 10/17/14 0940 10/18/14 0332 10/19/14 0526  WBC 4.6 4.1  --  6.2 7.0  NEUTROABS 3.2  --   --   --   --   HGB 9.0* 6.9* 7.1* 7.4* 10.4*  HCT 27.8* 21.0* 22.0* 22.6* 31.1*  MCV 88.5 87.9  --  88.3 83.4  PLT 175 137*  --  157 132*    Medications:    . acidophilus  1 capsule Oral Daily  . aspirin  81 mg Oral QHS  . clonazePAM  1 mg Oral QHS  . darbepoetin (ARANESP) injection - NON-DIALYSIS  60 mcg Subcutaneous Q Fri-1800  . ferrous gluconate  324 mg Oral Q breakfast  . fluconazole  100 mg Oral Daily  . furosemide  20 mg Intravenous Once  . heparin  5,000 Units Subcutaneous 3 times per day  . insulin aspart  0-9 Units Subcutaneous TID WC  . levothyroxine  75 mcg Oral QAC breakfast  . multivitamin with minerals  1 tablet Oral Daily  . mycophenolate  250 mg Oral BID  . pantoprazole  40 mg Oral Daily  . piperacillin-tazobactam (ZOSYN)  IV  2.25 g Intravenous Q6H  . sodium bicarbonate  650 mg Oral BID  . sodium chloride  3 mL Intravenous Q12H  . tacrolimus  0.5 mg Oral QHS  . tacrolimus  1 mg Oral Daily  . vancomycin  500 mg Intravenous Q48H    Zetta Bills, MD 10/19/2014, 9:59 AM

## 2014-10-19 NOTE — Progress Notes (Signed)
NP Lynch paged because patient became tachycardic (afib RVR 140s-190s) during magnesium administration.  Patient said this happened before.  Slowed the infusion rate and paged MD.  Ordered to increase cardizem to 15ml from 5ml.

## 2014-10-19 NOTE — Progress Notes (Addendum)
ANTICOAGULATION CONSULT NOTE - Follow Up  Pharmacy Consult for Heparin Indication: chest pain/ACS  Allergies  Allergen Reactions  . Codeine Nausea And Vomiting  . Elavil [Amitriptyline] Other (See Comments)    unknown  . Iodinated Diagnostic Agents      Renal transplant pt  . Lipitor [Atorvastatin]   . Magnesium Hives    Pt tolerated oral and IV during 06/2014 admission   . Metoclopramide     Other reaction(s): Other (See Comments) "doesn't work"  . Morphine     Other reaction(s): Confusion (intolerance)  . Morphine And Related   . Neurontin [Gabapentin]   . Niacin And Related   . Norvasc [Amlodipine Besylate]   . Talwin [Pentazocine]   . Ultram [Tramadol]    Patient Measurements: Height: 5\' 3"  (160 cm) Weight: 101 lb 14.4 oz (46.222 kg) (Scale A) IBW/kg (Calculated) : 52.4  Vital Signs: Temp: 98.7 F (37.1 C) (06/05 1420) Temp Source: Oral (06/05 1420) BP: 116/47 mmHg (06/05 1420) Pulse Rate: 100 (06/05 1420)  Labs:  Recent Labs  10/17/14 0526 10/17/14 0940 10/18/14 0332  10/18/14 1615 10/19/14 0526 10/19/14 1257 10/19/14 1656 10/19/14 1900  HGB 6.9* 7.1* 7.4*  --   --  10.4*  --   --   --   HCT 21.0* 22.0* 22.6*  --   --  31.1*  --   --   --   PLT 137*  --  157  --   --  132*  --   --   --   HEPARINUNFRC  --   --   --   --   --   --   --   --  0.17*  CREATININE 2.19*  --  2.30*  --  2.45* 2.53*  --   --   --   TROPONINI  --   --   --   < > 1.13*  --  1.03* 0.87*  --   < > = values in this interval not displayed.  Estimated Creatinine Clearance: 14.9 mL/min (by C-G formula based on Cr of 2.53).  Medical History: Past Medical History  Diagnosis Date  . Hypertension   . Anemia   . GERD (gastroesophageal reflux disease)   . Hypothyroidism   . Family history of adverse reaction to anesthesia     "my daughter; they thought she was asleep and she wasn't"  . Hyperlipemia   . Pneumonia "1-2 times"  . Type II diabetes mellitus   . History of blood  transfusion "several"    "all related to anemia"  . Arthritis     "knees" (10/16/2014)  . Chronic kidney disease     h/o transplant  . Frequent UTI   . Carotid artery stenosis    Medications:  Scheduled:  . acidophilus  1 capsule Oral Daily  . aspirin  81 mg Oral QHS  . clonazePAM  1 mg Oral QHS  . darbepoetin (ARANESP) injection - NON-DIALYSIS  60 mcg Subcutaneous Q Fri-1800  . diltiazem  30 mg Oral Q12H  . ferrous gluconate  324 mg Oral Q breakfast  . fluconazole  100 mg Oral Daily  . furosemide  20 mg Intravenous Once  . insulin aspart  0-9 Units Subcutaneous TID WC  . levothyroxine  75 mcg Oral QAC breakfast  . multivitamin with minerals  1 tablet Oral Daily  . mycophenolate  250 mg Oral BID  . pantoprazole  40 mg Oral Daily  . piperacillin-tazobactam (ZOSYN)  IV  2.25  g Intravenous Q6H  . sodium bicarbonate  650 mg Oral TID  . sodium chloride  3 mL Intravenous Q12H  . tacrolimus  0.5 mg Oral QHS  . tacrolimus  1 mg Oral Daily  . vancomycin  500 mg Intravenous Q48H   Infusions:  . heparin 550 Units/hr (10/19/14 1158)   Assessment: 74 yo F s/p kidney transplant in 2004 on chronic immunosuppressants admitted on 10/16/2014 with groin cellulitis. Heparin started for ongoing CP and elevated troponin to r/o ACS. Initial level low at 0.17.  Spoke with her nurse who confirmed that no noted IV pump issues and no stop in therapy.  She also confirms no bleeding issues  Goal of Therapy:  Heparin level 0.3-0.7 units/ml Monitor platelets by anticoagulation protocol: Yes   Plan:  - Increase heparin to 700 units/hr - Daily HL/CBC - Monitor for s/s bleeding   Nadara Mustard, PharmD., MS Clinical Pharmacist Pager:  [phone] Thank you for allowing pharmacy to be part of this patients care team. 10/19/2014 8:38 PM

## 2014-10-19 NOTE — Progress Notes (Signed)
[facility] PROGRESS NOTE  [name] RUE:[id number] DOB: 05/16/1941 DOA: 10/16/2014 PCP: Samuel Jester, DO  Assessment/Plan: #1 chest pain/demand ischemia Patient with complaints of chest pain with a chest heaviness noted to also be tachycardic with nausea and vomiting, yesterday. Likely secondary to demand ischemia.  Patient with multiple risk factors of diabetes, chronic kidney disease, hypertension, hyperlipidemia. EKG was done which shows some ST depression in leads 2, aVF, V4 through V6. Cardiac enzymes with elevated troponins. 2-D echo pending. Patient has been seen in consultation by cardiology who feel patient does have a demand ischemia with tachycardia in the setting of significant anemia .patient status post 2 units packed red blood cells with clinical improvement. VQ scan negative for PE. Cardiology recommended noninvasive stress testing tomorrow. Cardiology ff.   #2 MAT Questionable etiology. Likely secondary to anemia and demand ischemia. Patient was started on a Cardizem drip yesterday however was discontinued secondary to borderline blood pressure. Patient back in normal sinus rhythm. VQ scan negative for PE. Patient has been started on oral Cardizem per cardiology.  #3 cellulitis of mons pubis with sepsis ?? etiology. Patient with clinical improvement. Currently afebrile. Patient immunosuppressed. Monitor for abscess formation. Continue empiric IV vancomycin and IV Zosyn. Follow.  #4 acute on chronic kidney disease stage III Likely secondary to prerenal azotemia as patient is noted to be hypotensive and also septic. Patient's blood pressure responded to IV fluids. Renal transplant ultrasound with normal-appearing right renal transplant with normal perfusion. Creatinine still elevated at 2.53 from 2.30. Continue CellCept and Prograf. NSL IV fluids. Nephrology following.   #5 hyperkalemia Likely secondary to problem #2. Resolved. Follow.  #6 anemia Likely anemia of  chronic disease. Anemia panel consistent with anemia of chronic disease. Hemoglobin currently at 10.4 from 7.4. Patient s/p 2 units PRBCs. Follow. Renal following.  #7 hypotension Likely secondary to problem #1. Blood pressure responded some to IV fluids. Saline lock IV fluids. Continue empiric IV antibiotics. Follow.  #8 well-controlled diabetes mellitus Hemoglobin A1c is 5.9. pending. CBGs have ranged from 110-158. Continue Lantus at 6 units daily. Sliding scale insulin.  #9 hypothyroidism TSH decreased at 0.046. Free T4 within normal limits. Continue home regimen of Synthroid at . Will likely need repeat thyroid function studies done in about 4-6 weeks.  #10 gastroesophageal reflux disease PPI.  #11 hypertension Patient now tachycardic. Blood pressure slightly improved however still soft. Patient on cardizem.   #12 moderate protein calorie malnutrition Continue nutritional supplements.  #13 prophylaxis Heparin for DVT prophylaxis.  Code Status: Full Family Communication: Updated patient and family at bedside. Disposition Plan: Home when medically stable.   Consultants:  Nephrology: Dr. Allena Katz 10/17/2014  Procedures:  Renal transplant ultrasound 10/17/2014   Chest x-ray 10/16/2014  V/Q scan 10/19/14  2 units PRBC 10/18/14  Antibiotics:  IV vancomycin 10/16/2014>>>>>>10/19/14  IV Zosyn 10/16/2014  HPI/Subjective: Patient states CP improved. No SOB. Patient's states feeling better.  Objective: Filed Vitals:   10/19/14 1021  BP: 122/70  Pulse: 103  Temp: 98.5 F (36.9 C)  Resp:     Intake/Output Summary (Last 24 hours) at 10/19/14 1412 Last data filed at 10/19/14 0850  Gross per 24 hour  Intake   1455 ml  Output   1951 ml  Net   -496 ml   Filed Weights   10/17/14 0709 10/18/14 0505 10/19/14 0431  Weight: 44 kg (97 lb) 44.02 kg (97 lb 0.7 oz) 46.222 kg (101 lb 14.4 oz)    Exam:   General:  NAD  Cardiovascular: RRR  Respiratory:  CTAB  Abdomen: Soft, nontender, nondistended, positive bowel sounds.  Musculoskeletal: No cyanosis or edema.   Skin: Circular lesion over the right mons pubis less erythematous, nontender to palpation, approximately 2 cm in size.   Data Reviewed: Basic Metabolic Panel:  Recent Labs Lab 10/16/14 2035 10/17/14 0526 10/17/14 0940 10/18/14 0332 10/18/14 1615 10/19/14 0526  NA 137 140  --  137 138 138  K 3.9  3.8 5.6* 4.7 5.2* 5.0 3.9  CL 113* 117*  --  113* 111 108  CO2 13* 18*  --  15* 18* 16*  GLUCOSE 127* 81  --  194* 184* 133*  BUN 66* 50*  --  38* 35* 33*  CREATININE 2.65* 2.19*  --  2.30* 2.45* 2.53*  CALCIUM 8.5* 7.6*  --  8.0* 8.4* 8.3*  MG  --   --   --   --  1.1* 2.7*  PHOS  --   --   --  3.5  --  4.0   Liver Function Tests:  Recent Labs Lab 10/16/14 1635 10/18/14 0332 10/19/14 0526  AST 12*  --   --   ALT 11*  --   --   ALKPHOS 72  --   --   BILITOT 0.1*  --   --   PROT 6.6  --   --   ALBUMIN 3.7 2.9* 2.7*   No results for input(s): LIPASE, AMYLASE in the last 168 hours. No results for input(s): AMMONIA in the last 168 hours. CBC:  Recent Labs Lab 10/16/14 1635 10/17/14 0526 10/17/14 0940 10/18/14 0332 10/19/14 0526  WBC 4.6 4.1  --  6.2 7.0  NEUTROABS 3.2  --   --   --   --   HGB 9.0* 6.9* 7.1* 7.4* 10.4*  HCT 27.8* 21.0* 22.0* 22.6* 31.1*  MCV 88.5 87.9  --  88.3 83.4  PLT 175 137*  --  157 132*   Cardiac Enzymes:  Recent Labs Lab 10/16/14 2035 10/18/14 1127 10/18/14 1615  TROPONINI <0.03 0.25* 1.13*   BNP (last 3 results) No results for input(s): BNP in the last 8760 hours.  ProBNP (last 3 results) No results for input(s): PROBNP in the last 8760 hours.  CBG:  Recent Labs Lab 10/18/14 1126 10/18/14 1610 10/18/14 2130 10/19/14 0624 10/19/14 1126  GLUCAP 180* 158* 151* 110* 155*    Recent Results (from the past 240 hour(s))  Culture, blood (routine x 2)     Status: None (Preliminary result)   Collection Time: 10/16/14   8:21 PM  Result Value Ref Range Status   Specimen Description BLOOD RIGHT FOREARM  Final   Special Requests BOTTLES DRAWN AEROBIC AND ANAEROBIC 10CC EACH  Final   Culture   Final           BLOOD CULTURE RECEIVED NO GROWTH TO DATE CULTURE WILL BE HELD FOR 5 DAYS BEFORE ISSUING A FINAL NEGATIVE REPORT Note: Culture results may be compromised due to an excessive volume of blood received in culture bottles. Performed at [facility]    Report Status PENDING  Incomplete  Culture, blood (routine x 2)     Status: None (Preliminary result)   Collection Time: 10/16/14  8:35 PM  Result Value Ref Range Status   Specimen Description BLOOD LEFT FOREARM  Final   Special Requests BOTTLES DRAWN AEROBIC AND ANAEROBIC 10CC EACH  Final   Culture   Final  BLOOD CULTURE RECEIVED NO GROWTH TO DATE CULTURE WILL BE HELD FOR 5 DAYS BEFORE ISSUING A FINAL NEGATIVE REPORT Note: Culture results may be compromised due to an excessive volume of blood received in culture bottles. Performed at [facility]    Report Status PENDING  Incomplete  Urine culture     Status: None   Collection Time: 10/16/14  8:59 PM  Result Value Ref Range Status   Specimen Description URINE, CLEAN CATCH  Final   Special Requests NONE  Final   Colony Count NO GROWTH Performed at [facility]   Final   Culture NO GROWTH Performed at [facility]   Final   Report Status 10/17/2014 FINAL  Final     Studies: US Renal Transplant W/doppler  10/17/2014   CLINICAL DATA:  Acute renal failure.  EXAM: ULTRASOUND OF RENAL TRANSPLANT WITH DOPPLER  TECHNIQUE: Ultrasound examination of the renal transplant was performed with gray-scale, color and duplex doppler evaluation.  COMPARISON:  02/12/2010  FINDINGS: Transplant kidney location:  Right lower quadrant.  Transplant kidney:  Length: 10.8 cm. Normal in size and parenchymal echogenicity. No evidence of mass or hydronephrosis.  Color flow in the main renal  artery:  Present  Color flow in the main renal vein:  Present  Duplex Doppler Evaluation  Resistive index in main renal artery: 0.93  Venous waveform in main renal vein:  Present  Resistive index in upper pole intrarenal artery: 0.76  Resistive index in lower pole intrarenal artery: 0.71  Bladder: Normal for degree of bladder distention.  Other findings: Right ureteral jet identified. Transplant bed appears normal.  IMPRESSION: Normal appearing right renal transplant with normal perfusion.   Electronically Signed   By: Francene Boyers M.D.   On: 10/17/2014 18:12   Dg Abd Acute W/chest  10/18/2014   CLINICAL DATA:  Short of breath, nonproductive cough. Renal transplant.  EXAM: DG ABDOMEN ACUTE W/ 1V CHEST  COMPARISON:  Radiographs 10/16/2014  FINDINGS: Mildly enlarged cardiac silhouette. There is new central airspace consolidation within the right upper lobe and right lower lobe. There is patchy airspace disease within both lungs. There is linear peripheral interstitial markings in the right lung.  IMPRESSION: 1. Dense airspace disease in the central right upper lobe and right lower lobe representing pneumonia versus dense edema. 2. Bilateral patchy nodular airspace disease with differential including pulmonary edema versus multifocal pneumonia. 3. Interstitial edema pattern noted particular in the right lung.   Electronically Signed   By: Genevive Bi M.D.   On: 10/18/2014 12:30    Scheduled Meds: . acidophilus  1 capsule Oral Daily  . aspirin  81 mg Oral QHS  . clonazePAM  1 mg Oral QHS  . darbepoetin (ARANESP) injection - NON-DIALYSIS  60 mcg Subcutaneous Q Fri-1800  . diltiazem  30 mg Oral Q12H  . ferrous gluconate  324 mg Oral Q breakfast  . fluconazole  100 mg Oral Daily  . furosemide  20 mg Intravenous Once  . insulin aspart  0-9 Units Subcutaneous TID WC  . levothyroxine  75 mcg Oral QAC breakfast  . multivitamin with minerals  1 tablet Oral Daily  . mycophenolate  250 mg Oral BID  .  pantoprazole  40 mg Oral Daily  . piperacillin-tazobactam (ZOSYN)  IV  2.25 g Intravenous Q6H  . sodium bicarbonate  650 mg Oral TID  . sodium chloride  3 mL Intravenous Q12H  . tacrolimus  0.5 mg Oral QHS  . tacrolimus  1 mg Oral Daily  . vancomycin  500 mg Intravenous Q48H   Continuous Infusions: . heparin 550 Units/hr (10/19/14 1158)    Principal Problem:   Cellulitis of right groin Active Problems:   Chest pain   Sepsis   Anemia   Essential hypertension   GERD (gastroesophageal reflux disease)   Hypothyroidism   UTI (lower urinary tract infection)   Carotid artery stenosis   Malnutrition of moderate degree   ARF (acute renal failure)   Nausea with vomiting   SOB (shortness of breath)   Pain in the chest   Skin infection   Demand ischemia   Multifocal atrial tachycardia    Time spent: 40 mins    [facility] MD [facility] Pager [phone]. If 7PM-7AM, please contact night-coverage at [email or web address], password [facility] 10/19/2014, 2:12 PM  LOS: 3 days

## 2014-10-19 NOTE — Progress Notes (Signed)
ANTICOAGULATION CONSULT NOTE - Initial Consult  Pharmacy Consult for Heparin Indication: chest pain/ACS  Allergies  Allergen Reactions  . Codeine Nausea And Vomiting  . Elavil [Amitriptyline] Other (See Comments)    unknown  . Iodinated Diagnostic Agents      Renal transplant pt  . Lipitor [Atorvastatin]   . Magnesium Hives    Pt tolerated oral and IV during 06/2014 admission   . Metoclopramide     Other reaction(s): Other (See Comments) "doesn't work"  . Morphine     Other reaction(s): Confusion (intolerance)  . Morphine And Related   . Neurontin [Gabapentin]   . Niacin And Related   . Norvasc [Amlodipine Besylate]   . Talwin [Pentazocine]   . Ultram [Tramadol]     Patient Measurements: Height:  (160 cm) Weight: 101 lb 14.4 oz (46.222 kg) (Scale A) IBW/kg (Calculated) : 52.4  Vital Signs: Temp: 98.5 F (36.9 C) (06/05 1021) Temp Source: Oral (06/05 1021) BP: 122/70 mmHg (06/05 1021) Pulse Rate: 103 (06/05 1021)  Labs:  Recent Labs  10/16/14 2035 10/17/14 0526 10/17/14 0940 10/18/14 0332 10/18/14 1127 10/18/14 1615 10/19/14 0526  HGB  --  6.9* 7.1* 7.4*  --   --  10.4*  HCT  --  21.0* 22.0* 22.6*  --   --  31.1*  PLT  --  137*  --  157  --   --  132*  APTT 29  --   --   --   --   --   --   LABPROT 14.7  --   --   --   --   --   --   INR 1.13  --   --   --   --   --   --   CREATININE 2.65* 2.19*  --  2.30*  --  2.45* 2.53*  TROPONINI <0.03  --   --   --  0.25* 1.13*  --     Estimated Creatinine Clearance: 14.9 mL/min (by C-G formula based on Cr of 2.53).   Medical History: Past Medical History  Diagnosis Date  . Hypertension   . Anemia   . GERD (gastroesophageal reflux disease)   . Hypothyroidism   . Family history of adverse reaction to anesthesia     "my daughter; they thought she was asleep and she wasn't"  . Hyperlipemia   . Pneumonia "1-2 times"  . Type II diabetes mellitus   . History of blood transfusion "several"    "all  related to anemia"  . Arthritis     "knees" (10/16/2014)  . Chronic kidney disease     h/o transplant  . Frequent UTI   . Carotid artery stenosis     Medications:  Scheduled:  . acidophilus  1 capsule Oral Daily  . aspirin  81 mg Oral QHS  . clonazePAM  1 mg Oral QHS  . darbepoetin (ARANESP) injection - NON-DIALYSIS  60 mcg Subcutaneous Q Fri-1800  . diltiazem  30 mg Oral Q12H  . ferrous gluconate  324 mg Oral Q breakfast  . fluconazole  100 mg Oral Daily  . furosemide  20 mg Intravenous Once  . insulin aspart  0-9 Units Subcutaneous TID WC  . levothyroxine  75 mcg Oral QAC breakfast  . multivitamin with minerals  1 tablet Oral Daily  . mycophenolate  250 mg Oral BID  . pantoprazole  40 mg Oral Daily  . piperacillin-tazobactam (ZOSYN)  IV  2.25 g Intravenous Q6H  .  sodium bicarbonate  650 mg Oral TID  . sodium chloride  3 mL Intravenous Q12H  . tacrolimus  0.5 mg Oral QHS  . tacrolimus  1 mg Oral Daily  . vancomycin  500 mg Intravenous Q48H   Infusions:    Assessment: 74 yo F s/p kidney transplant in 2004 on chronic immunosuppressants admitted on 10/16/2014 with groin cellulitis. Pharmacy has now been consulted to initiate heparin for ongoing CP and elevated troponin to r/o ACS. Will dose heparin cautiously as patient has anemia of CKD and received 2u PRBC on 6/4 and hgb improved 7.4>10.4. Pltc remains stable with no acute blood losses noted.  Goal of Therapy:  Heparin level 0.3-0.7 units/ml Monitor platelets by anticoagulation protocol: Yes   Plan:  - Initiate heparin 550 units/hr (without bolus since received SQ this am and dosing cautiously) - 8 hour HL - Daily HL/CBC - Monitor for s/s bleeding   [name], PharmD, BCPS Clinical Pharmacist - Resident Pager: [phone] Pharmacy: [phone] 10/19/2014 10:36 AM

## 2014-10-19 NOTE — Progress Notes (Signed)
ANTIBIOTIC CONSULT NOTE - INITIAL  Pharmacy Consult for vancomycin + zosyn Indication: cellulitis  Allergies  Allergen Reactions  . Codeine Nausea And Vomiting  . Elavil [Amitriptyline] Other (See Comments)    unknown  . Iodinated Diagnostic Agents      Renal transplant pt  . Lipitor [Atorvastatin]   . Magnesium Hives    Pt tolerated oral and IV during 06/2014 admission   . Metoclopramide     Other reaction(s): Other (See Comments) "doesn't work"  . Morphine     Other reaction(s): Confusion (intolerance)  . Morphine And Related   . Neurontin [Gabapentin]   . Niacin And Related   . Norvasc [Amlodipine Besylate]   . Talwin [Pentazocine]   . Ultram [Tramadol]     Patient Measurements: Height: 5\' 3"  (160 cm) Weight: 101 lb 14.4 oz (46.222 kg) (Scale A) IBW/kg (Calculated) : 52.4 Adjusted Body Weight:   Vital Signs: Temp: 98.5 F (36.9 C) (06/05 1021) Temp Source: Oral (06/05 1021) BP: 122/70 mmHg (06/05 1021) Pulse Rate: 103 (06/05 1021) Intake/Output from previous day: 06/04 0701 - 06/05 0700 In: 1335 [P.O.:410; Blood:575; IV Piggyback:350] Out: 1952 [Urine:1950; Stool:2] Intake/Output from this shift: Total I/O In: 120 [P.O.:120] Out: -   Labs:  Recent Labs  10/17/14 0309 10/17/14 0526 10/17/14 0940 10/18/14 0332 10/18/14 1615 10/19/14 0526  WBC  --  4.1  --  6.2  --  7.0  HGB  --  6.9* 7.1* 7.4*  --  10.4*  PLT  --  137*  --  157  --  132*  LABCREA 16.30  --   --   --   --   --   CREATININE  --  2.19*  --  2.30* 2.45* 2.53*   Estimated Creatinine Clearance: 14.9 mL/min (by C-G formula based on Cr of 2.53). No results for input(s): VANCOTROUGH, VANCOPEAK, VANCORANDOM, GENTTROUGH, GENTPEAK, GENTRANDOM, TOBRATROUGH, TOBRAPEAK, TOBRARND, AMIKACINPEAK, AMIKACINTROU, AMIKACIN in the last 72 hours.   Microbiology: Recent Results (from the past 720 hour(s))  Culture, blood (routine x 2)     Status: None (Preliminary result)   Collection Time: 10/16/14   8:21 PM  Result Value Ref Range Status   Specimen Description BLOOD RIGHT FOREARM  Final   Special Requests BOTTLES DRAWN AEROBIC AND ANAEROBIC 10CC EACH  Final   Culture   Final           BLOOD CULTURE RECEIVED NO GROWTH TO DATE CULTURE WILL BE HELD FOR 5 DAYS BEFORE ISSUING A FINAL NEGATIVE REPORT Note: Culture results may be compromised due to an excessive volume of blood received in culture bottles. Performed at [facility]    Report Status PENDING  Incomplete  Culture, blood (routine x 2)     Status: None (Preliminary result)   Collection Time: 10/16/14  8:35 PM  Result Value Ref Range Status   Specimen Description BLOOD LEFT FOREARM  Final   Special Requests BOTTLES DRAWN AEROBIC AND ANAEROBIC 10CC EACH  Final   Culture   Final           BLOOD CULTURE RECEIVED NO GROWTH TO DATE CULTURE WILL BE HELD FOR 5 DAYS BEFORE ISSUING A FINAL NEGATIVE REPORT Note: Culture results may be compromised due to an excessive volume of blood received in culture bottles. Performed at [facility]    Report Status PENDING  Incomplete  Urine culture     Status: None   Collection Time: 10/16/14  8:59 PM  Result Value Ref Range  Status   Specimen Description URINE, CLEAN CATCH  Final   Special Requests NONE  Final   Colony Count NO GROWTH Performed at [facility]   Final   Culture NO GROWTH Performed at [facility]   Final   Report Status 10/17/2014 FINAL  Final    Medical History: Past Medical History  Diagnosis Date  . Hypertension   . Anemia   . GERD (gastroesophageal reflux disease)   . Hypothyroidism   . Family history of adverse reaction to anesthesia     "my daughter; they thought she was asleep and she wasn't"  . Hyperlipemia   . Pneumonia "1-2 times"  . Type II diabetes mellitus   . History of blood transfusion "several"    "all related to anemia"  . Arthritis     "knees" (10/16/2014)  . Chronic kidney disease     h/o transplant  . Frequent  UTI   . Carotid artery stenosis     Medications:  Anti-infectives    Start     Dose/Rate Route Frequency Ordered Stop   10/18/14 2100  vancomycin (VANCOCIN) 500 mg in sodium chloride 0.9 % 100 mL IVPB     500 mg 100 mL/hr over 60 Minutes Intravenous Every 48 hours 10/16/14 2027     10/17/14 0300  piperacillin-tazobactam (ZOSYN) IVPB 2.25 g     2.25 g 100 mL/hr over 30 Minutes Intravenous Every 6 hours 10/16/14 2027     10/16/14 2145  fluconazole (DIFLUCAN) tablet 100 mg     100 mg Oral Daily 10/16/14 2137     10/16/14 2030  vancomycin (VANCOCIN) IVPB 1000 mg/200 mL premix     1,000 mg 200 mL/hr over 60 Minutes Intravenous  Once 10/16/14 2027 10/17/14 0122   10/16/14 2030  piperacillin-tazobactam (ZOSYN) IVPB 3.375 g     3.375 g 100 mL/hr over 30 Minutes Intravenous  Once 10/16/14 2027 10/16/14 2210   10/16/14 1915  cefTRIAXone (ROCEPHIN) 1 g in dextrose 5 % 50 mL IVPB  Status:  Discontinued     1 g 100 mL/hr over 30 Minutes Intravenous  Once 10/16/14 1910 10/16/14 1954   10/16/14 0000  doxycycline (VIBRAMYCIN) 100 MG capsule     100 mg Oral 2 times daily 10/16/14 1652       Assessment: 73 yof s/p kidney transplant in 2004 on chronic immunosuppressants admitted on 10/16/2014 with groin cellulitis on day #4 vanc/zosyn. Tmax 100.2, WBC wnl, SCr trending back up to 2.53 (CrCl ~14 ml/min).   Vanc 6/2>> Zosyn 6/2>>  6/2 BCx2>>ngtd 6/2 UCx>>NEG  Goal of Therapy:  Vancomycin trough level 10-15 mcg/ml  Plan:  - Continue Zosyn 2.25gm IV Q6H - Continue vancomycin  IV Q48H - F/u renal fxn, C&S, clinical status and trough at [facility] Bonnye Fava, PharmD, BCPS Clinical Pharmacist - Resident Pager: [phone] Pharmacy: [phone] 10/19/2014 10:29 AM

## 2014-10-20 ENCOUNTER — Ambulatory Visit (HOSPITAL_COMMUNITY): Payer: Medicare Other

## 2014-10-20 DIAGNOSIS — N189 Chronic kidney disease, unspecified: Secondary | ICD-10-CM

## 2014-10-20 DIAGNOSIS — R7989 Other specified abnormal findings of blood chemistry: Secondary | ICD-10-CM

## 2014-10-20 DIAGNOSIS — D631 Anemia in chronic kidney disease: Secondary | ICD-10-CM

## 2014-10-20 LAB — CBC
HCT: 30.5 % — ABNORMAL LOW (ref 36.0–46.0)
Hemoglobin: 10.4 g/dL — ABNORMAL LOW (ref 12.0–15.0)
MCH: 28.4 pg (ref 26.0–34.0)
MCHC: 34.1 g/dL (ref 30.0–36.0)
MCV: 83.3 fL (ref 78.0–100.0)
Platelets: 168 10*3/uL (ref 150–400)
RBC: 3.66 MIL/uL — ABNORMAL LOW (ref 3.87–5.11)
RDW: 16.6 % — ABNORMAL HIGH (ref 11.5–15.5)
WBC: 6.6 10*3/uL (ref 4.0–10.5)

## 2014-10-20 LAB — CULTURE, BLOOD (ROUTINE X 2)

## 2014-10-20 LAB — HEPARIN LEVEL (UNFRACTIONATED)
HEPARIN UNFRACTIONATED: 0.27 [IU]/mL — AB (ref 0.30–0.70)
HEPARIN UNFRACTIONATED: 0.33 [IU]/mL (ref 0.30–0.70)

## 2014-10-20 LAB — RENAL FUNCTION PANEL
Albumin: 2.7 g/dL — ABNORMAL LOW (ref 3.5–5.0)
Anion gap: 14 (ref 5–15)
BUN: 27 mg/dL — AB (ref 6–20)
CALCIUM: 8.5 mg/dL — AB (ref 8.9–10.3)
CO2: 18 mmol/L — AB (ref 22–32)
Chloride: 110 mmol/L (ref 101–111)
Creatinine, Ser: 2.4 mg/dL — ABNORMAL HIGH (ref 0.44–1.00)
GFR calc Af Amer: 22 mL/min — ABNORMAL LOW (ref >60)
GFR calc non Af Amer: 19 mL/min — ABNORMAL LOW (ref >60)
GLUCOSE: 96 mg/dL (ref 65–99)
POTASSIUM: 3.7 mmol/L (ref 3.5–5.1)
Phosphorus: 3.5 mg/dL (ref 2.5–4.6)
Sodium: 142 mmol/L (ref 135–145)

## 2014-10-20 LAB — GLUCOSE, CAPILLARY
GLUCOSE-CAPILLARY: 172 mg/dL — AB (ref 65–99)
Glucose-Capillary: 113 mg/dL — ABNORMAL HIGH (ref 65–99)
Glucose-Capillary: 158 mg/dL — ABNORMAL HIGH (ref 65–99)
Glucose-Capillary: 140 mg/dL — ABNORMAL HIGH (ref 65–99)

## 2014-10-20 LAB — OCCULT BLOOD X 1 CARD TO LAB, STOOL: Fecal Occult Bld: NEGATIVE

## 2014-10-20 LAB — MAGNESIUM: MAGNESIUM: 1.9 mg/dL (ref 1.7–2.4)

## 2014-10-20 MED ORDER — FUROSEMIDE 80 MG PO TABS
80.0000 mg | ORAL_TABLET | Freq: Every day | ORAL | Status: AC
  Administered 2014-10-20: 80 mg via ORAL
  Filled 2014-10-20: qty 1

## 2014-10-20 MED ORDER — METOPROLOL TARTRATE 12.5 MG HALF TABLET
12.5000 mg | ORAL_TABLET | Freq: Two times a day (BID) | ORAL | Status: DC
  Administered 2014-10-20 (×2): 12.5 mg via ORAL
  Filled 2014-10-20 (×4): qty 1

## 2014-10-20 MED ORDER — CEPHALEXIN 500 MG PO CAPS
500.0000 mg | ORAL_CAPSULE | Freq: Two times a day (BID) | ORAL | Status: DC
  Filled 2014-10-20 (×2): qty 1

## 2014-10-20 MED ORDER — CEPHALEXIN 500 MG PO CAPS
500.0000 mg | ORAL_CAPSULE | Freq: Every day | ORAL | Status: DC
  Filled 2014-10-20: qty 1

## 2014-10-20 NOTE — Progress Notes (Signed)
Patient Name: [name] Date of Encounter: 10/20/2014  Primary Cardiology: previously Dr. Daleen Squibb   Principal Problem:   Cellulitis of right groin Active Problems:   Anemia   Essential hypertension   GERD (gastroesophageal reflux disease)   Hypothyroidism   UTI (lower urinary tract infection)   Carotid artery stenosis   Malnutrition of moderate degree   Sepsis   ARF (acute renal failure)   Chest pain   Nausea with vomiting   SOB (shortness of breath)   Pain in the chest   Skin infection   Demand ischemia   Multifocal atrial tachycardia    SUBJECTIVE  Denies any CP or SOB.   CURRENT MEDS . acidophilus  1 capsule Oral Daily  . aspirin  81 mg Oral QHS  . cephALEXin  500 mg Oral Q12H  . clonazePAM  1 mg Oral QHS  . darbepoetin (ARANESP) injection - NON-DIALYSIS  60 mcg Subcutaneous Q Fri-1800  . diltiazem  30 mg Oral Q12H  . ferrous gluconate  324 mg Oral Q breakfast  . fluconazole  100 mg Oral Daily  . furosemide  20 mg Intravenous Once  . furosemide  80 mg Oral Daily  . insulin aspart  0-9 Units Subcutaneous TID WC  . levothyroxine  75 mcg Oral QAC breakfast  . multivitamin with minerals  1 tablet Oral Daily  . mycophenolate  250 mg Oral BID  . pantoprazole  40 mg Oral Daily  . sodium bicarbonate  650 mg Oral TID  . sodium chloride  3 mL Intravenous Q12H  . tacrolimus  0.5 mg Oral QHS  . tacrolimus  1 mg Oral Daily    OBJECTIVE  Filed Vitals:   10/19/14 1021 10/19/14 1420 10/19/14 2153 10/20/14 0500  BP: 122/70 116/47 119/56 122/60  Pulse: 103 100 103 100  Temp: 98.5 F (36.9 C) 98.7 F (37.1 C) 98.4 F (36.9 C) 98.4 F (36.9 C)  TempSrc: Oral Oral Oral Oral  Resp:  Height:      Weight:    99 lb 8 oz (45.133 kg)  SpO2: 100% 100% 99% 100%    Intake/Output Summary (Last 24 hours) at 10/20/14 0956 Last data filed at 10/20/14 0951  Gross per 24 hour  Intake 1513.46 ml  Output    750 ml  Net 763.46 ml   Filed Weights   10/18/14  0505 10/19/14 0431 10/20/14 0500  Weight: 97 lb 0.7 oz (44.02 kg) 101 lb 14.4 oz (46.222 kg) 99 lb 8 oz (45.133 kg)    PHYSICAL EXAM  General: Pleasant, NAD. Neuro: Alert and oriented X 3. Moves all extremities spontaneously. Psych: Normal affect. HEENT:  Normal  Neck: Supple without bruits or JVD. Lungs:  Resp regular and unlabored. Mildly decreased breath sound in bilateral bases. Heart: RRR no s3, s4, or murmurs. Abdomen: Soft, non-tender, non-distended, BS + x 4.  Extremities: No clubbing, cyanosis or edema. DP/PT/Radials 2+ and equal bilaterally.  Accessory Clinical Findings  CBC  Recent Labs  10/19/14 0526 10/20/14 0348  WBC 7.0 6.6  HGB 10.4* 10.4*  HCT 31.1* 30.5*  MCV 83.4 83.3  PLT 132* 168   Basic Metabolic Panel  Recent Labs  10/19/14 0526 10/20/14 0348  NA 138 142  K 3.9 3.7  CL 108 110  CO2 16* 18*  GLUCOSE 133* 96  BUN 33* 27*  CREATININE 2.53* 2.40*  CALCIUM 8.3* 8.5*  MG 2.7* 1.9  PHOS 4.0 3.5   Liver Function Tests  Recent Labs  10/19/14 0526 10/20/14 0348  ALBUMIN 2.7* 2.7*   Cardiac Enzymes  Recent Labs  10/19/14 1257 10/19/14 1656 10/19/14 2200  TROPONINI 1.03* 0.87* 0.85*    TELE NSR with HR 90s    ECG  No new EKG  Echocardiogram  pending    Radiology/Studies  US Renal Transplant W/doppler  10/17/2014   CLINICAL DATA:  Acute renal failure.  EXAM: ULTRASOUND OF RENAL TRANSPLANT WITH DOPPLER  TECHNIQUE: Ultrasound examination of the renal transplant was performed with gray-scale, color and duplex doppler evaluation.  COMPARISON:  02/12/2010  FINDINGS: Transplant kidney location:  Right lower quadrant.  Transplant kidney:  Length: 10.8 cm. Normal in size and parenchymal echogenicity. No evidence of mass or hydronephrosis.  Color flow in the main renal artery:  Present  Color flow in the main renal vein:  Present  Duplex Doppler Evaluation  Resistive index in main renal artery: 0.93  Venous waveform in main renal vein:   Present  Resistive index in upper pole intrarenal artery: 0.76  Resistive index in lower pole intrarenal artery: 0.71  Bladder: Normal for degree of bladder distention.  Other findings: Right ureteral jet identified. Transplant bed appears normal.  IMPRESSION: Normal appearing right renal transplant with normal perfusion.   Electronically Signed   By: Francene Boyers M.D.   On: 10/17/2014 18:12   Nm Pulmonary Perf And Vent  10/19/2014   CLINICAL DATA:  Shortness of breath, dyspnea, persistent cough, history hypertension, pneumonia, type 2 diabetes, renal transplant, question pulmonary embolism  EXAM: NUCLEAR MEDICINE VENTILATION - PERFUSION LUNG SCAN  TECHNIQUE: Ventilation images were obtained in multiple projections using inhaled aerosol Tc-33m DTPA. Perfusion images were obtained in multiple projections after intravenous injection of Tc-47m MAA.  RADIOPHARMACEUTICALS:  40 mCi Technetium-60m DTPA aerosol inhalation and 6 mCi Technetium-76m MAA IV  COMPARISON:  None; correlation chest radiograph 10/18/2014  FINDINGS: Ventilation: Patchy impairment of ventilation within both upper lobes and LEFT lower lobe.  Perfusion: Normal.  No segmental or subsegmental perfusion defects.  Chest radiograph demonstrates significant BILATERAL pulmonary infiltrates consistent with pneumonia or less likely edema.  IMPRESSION: Normal perfusion lung scan.  Patchy impairment of ventilation in both lungs consistent with parenchymal lung disease/infiltrates as noted on most recent chest radiograph.   Electronically Signed   By: Ulyses Southward M.D.   On: 10/19/2014 14:21   Dg Chest Port 1 View  10/16/2014   CLINICAL DATA:  Abscess.  UTI for 3 days.  EXAM: PORTABLE CHEST - 1 VIEW  COMPARISON:  None.  FINDINGS: The heart size and mediastinal contours are within normal limits. Both lungs are clear. The visualized skeletal structures are unremarkable.  IMPRESSION: No active disease.   Electronically Signed   By: Signa Kell M.D.   On:  10/16/2014 21:09   Dg Abd Acute W/chest  10/18/2014   CLINICAL DATA:  Short of breath, nonproductive cough. Renal transplant.  EXAM: DG ABDOMEN ACUTE W/ 1V CHEST  COMPARISON:  Radiographs 10/16/2014  FINDINGS: Mildly enlarged cardiac silhouette. There is new central airspace consolidation within the right upper lobe and right lower lobe. There is patchy airspace disease within both lungs. There is linear peripheral interstitial markings in the right lung.  IMPRESSION: 1. Dense airspace disease in the central right upper lobe and right lower lobe representing pneumonia versus dense edema. 2. Bilateral patchy nodular airspace disease with differential including pulmonary edema versus multifocal pneumonia. 3. Interstitial edema pattern noted particular in the right lung.   Electronically Signed  By: Genevive BiStewart  Edmunds M.D.   On: 10/18/2014 12:30    ASSESSMENT AND PLAN  Ms. 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 During admission had episode of chest pain and we were consulted.   1. Elevated trop likely demand ischemia during MAT and CKD  - trop 1.03 --> 0.87  - V/Q scan negative for PE, pending echo  - would be a poor candidate for invasive workup given poor renal function  - potentially consider myoview as inpt or outpt depend on echo finding.   2. MAT  - on metoprolol 50mg  BID at home, discontinued due to hypotension? Currently on 30mg  dilt q12 hrs  - will d/c diltiazem, and restart on metoprolol 12.5mg , uptitrate as tolerated.   3. 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 Mons Pubis Celllulitis 5. Anemia 6. DM  Signed, Azalee CourseMeng, Hao PA-C Pager: [phone]  I have examined the patient and reviewed assessment and plan and discussed with patient.  Agree with above as stated.  Med changes as noted above.  Tele stable at this time. Low BPs in the past.  Poor renal function  precludes invasive testing.  medical management at this time.   Peyson Delao S.

## 2014-10-20 NOTE — Progress Notes (Signed)
[facility] PROGRESS NOTE  [name] ION:[id number] DOB: 19-Mar-1941 DOA: 10/16/2014 PCP: Samuel Jester, DO  Assessment/Plan: #1 chest pain/demand ischemia Patient with complaints of chest pain with a chest heaviness noted to also be tachycardic with nausea and vomiting, yesterday. Likely secondary to demand ischemia.  Patient with multiple risk factors of diabetes, chronic kidney disease, hypertension, hyperlipidemia. EKG was done which shows some ST depression in leads 2, aVF, V4 through V6. Cardiac enzymes with elevated troponins. 2-D echo pending. Patient has been seen in consultation by cardiology who feel patient does have a demand ischemia with tachycardia in the setting of significant anemia .patient status post 2 units packed red blood cells with clinical improvement. VQ scan negative for PE. Per cardiology.  #2 MAT Questionable etiology. Likely secondary to anemia and demand ischemia. Patient was started on a Cardizem drip, however was discontinued secondary to borderline blood pressure. Patient back in normal sinus rhythm. VQ scan negative for PE. Oral Cardizem has been changed to metoprolol per cardiology.   #3 cellulitis of mons pubis with sepsis ?? etiology. Patient with clinical improvement. Currently afebrile. Patient immunosuppressed. Change IV antibiotics to oral Keflex to complete course of antibiotics therapy.   #4 acute on chronic kidney disease stage III Likely secondary to prerenal azotemia as patient is noted to be hypotensive and also septic. Patient's blood pressure responded to IV fluids. Renal transplant ultrasound with normal-appearing right renal transplant with normal perfusion. Creatinine trending back down and currently at 2.4 from 2.53 from 2.30. Patient has been given a dose of oral Lasix per nephrology today. Continue CellCept and Prograf. NSL IV fluids. Nephrology following.   #5 hyperkalemia Likely secondary to problem #2. Resolved. Follow.  #6  anemia Likely anemia of chronic disease. Anemia panel consistent with anemia of chronic disease. Hemoglobin currently at 10.4 from 7.4. Patient s/p 2 units PRBCs. Follow. Renal following.  #7 hypotension Likely secondary to problem #1. Blood pressure responded some to IV fluids. Saline lock IV fluids. Continue empiric antibiotics. Follow.  #8 well-controlled diabetes mellitus Hemoglobin A1c is 5.9. pending. CBGs have ranged from 113-158. Continue Lantus at 6 units daily. Sliding scale insulin.  #9 hypothyroidism TSH decreased at 0.046. Free T4 within normal limits. Continue home regimen of Synthroid at . Will likely need repeat thyroid function studies done in about 4-6 weeks.  #10 gastroesophageal reflux disease PPI.  #11 hypertension Patient has been changed from Cardizem to metoprolol per cardiology.  #12 moderate protein calorie malnutrition Continue nutritional supplements.  #13 prophylaxis Heparin for DVT prophylaxis.  Code Status: Full Family Communication: Updated patient and family at bedside. Disposition Plan: Home when medically stable.   Consultants:  Nephrology: Dr. Allena Katz 10/17/2014   Cardiology: Dr. Wyline Mood 10/18/2014  Procedures:  Renal transplant ultrasound 10/17/2014   Chest x-ray 10/16/2014  V/Q scan 10/19/14  2 units PRBC 10/18/14  2-D echo pending  Antibiotics:  IV vancomycin 10/16/2014>>>>>>10/19/14  IV Zosyn 10/16/2014>>>>> 10/20/2014  keflex 10/20/2014  HPI/Subjective: Patient denies chest pain. No shortness of breath. Patient is much better.  Objective: Filed Vitals:   10/20/14 1329  BP: 127/59  Pulse: 85  Temp: 98.3 F (36.8 C)  Resp: 18    Intake/Output Summary (Last 24 hours) at 10/20/14 1420 Last data filed at 10/20/14 1307  Gross per 24 hour  Intake 1445.46 ml  Output    950 ml  Net 495.46 ml   Filed Weights   10/18/14 0505 10/19/14 0431 10/20/14 0500  Weight: 44.02 kg (97 lb 0.7  oz) 46.222 kg (101 lb 14.4 oz)  45.133 kg (99 lb 8 oz)    Exam:   General:  NAD  Cardiovascular: RRR  Respiratory: CTAB  Abdomen: Soft, nontender, nondistended, positive bowel sounds.  Musculoskeletal: No cyanosis or edema.   Skin: Circular lesion over the right mons pubis less erythematous, nontender to palpation, approximately 2 cm in size.   Data Reviewed: Basic Metabolic Panel:  Recent Labs Lab 10/17/14 0526 10/17/14 0940 10/18/14 0332 10/18/14 1615 10/19/14 0526 10/20/14 0348  NA 140  --  137 138 138 142  K 5.6* 4.7 5.2* 5.0 3.9 3.7  CL 117*  --  113* 111 108 110  CO2 18*  --  15* 18* 16* 18*  GLUCOSE 81  --  194* 184* 133* 96  BUN 50*  --  38* 35* 33* 27*  CREATININE 2.19*  --  2.30* 2.45* 2.53* 2.40*  CALCIUM 7.6*  --  8.0* 8.4* 8.3* 8.5*  MG  --   --   --  1.1* 2.7* 1.9  PHOS  --   --  3.5  --  4.0 3.5   Liver Function Tests:  Recent Labs Lab 10/16/14 1635 10/18/14 0332 10/19/14 0526 10/20/14 0348  AST 12*  --   --   --   ALT 11*  --   --   --   ALKPHOS 72  --   --   --   BILITOT 0.1*  --   --   --   PROT 6.6  --   --   --   ALBUMIN 3.7 2.9* 2.7* 2.7*   No results for input(s): LIPASE, AMYLASE in the last 168 hours. No results for input(s): AMMONIA in the last 168 hours. CBC:  Recent Labs Lab 10/16/14 1635 10/17/14 0526 10/17/14 0940 10/18/14 0332 10/19/14 0526 10/20/14 0348  WBC 4.6 4.1  --  6.2 7.0 6.6  NEUTROABS 3.2  --   --   --   --   --   HGB 9.0* 6.9* 7.1* 7.4* 10.4* 10.4*  HCT 27.8* 21.0* 22.0* 22.6* 31.1* 30.5*  MCV 88.5 87.9  --  88.3 83.4 83.3  PLT 175 137*  --  157 132* 168   Cardiac Enzymes:  Recent Labs Lab 10/18/14 1127 10/18/14 1615 10/19/14 1257 10/19/14 1656 10/19/14 2200  TROPONINI 0.25* 1.13* 1.03* 0.87* 0.85*   BNP (last 3 results) No results for input(s): BNP in the last 8760 hours.  ProBNP (last 3 results) No results for input(s): PROBNP in the last 8760 hours.  CBG:  Recent Labs Lab 10/19/14 1126 10/19/14 1637  10/19/14 2153 10/20/14 0605 10/20/14 1058  GLUCAP 155* 158* 171* 113* 158*    Recent Results (from the past 240 hour(s))  Culture, blood (routine x 2)     Status: None (Preliminary result)   Collection Time: 10/16/14  8:21 PM  Result Value Ref Range Status   Specimen Description BLOOD RIGHT FOREARM  Final   Special Requests BOTTLES DRAWN AEROBIC AND ANAEROBIC 10CC EACH  Final   Culture   Final           BLOOD CULTURE RECEIVED NO GROWTH TO DATE CULTURE WILL BE HELD FOR 5 DAYS BEFORE ISSUING A FINAL NEGATIVE REPORT Note: Culture results may be compromised due to an excessive volume of blood received in culture bottles. Performed at [facility]    Report Status PENDING  Incomplete  Culture, blood (routine x 2)     Status: None   Collection  Time: 10/16/14  8:35 PM  Result Value Ref Range Status   Specimen Description BLOOD LEFT FOREARM  Final   Special Requests BOTTLES DRAWN AEROBIC AND ANAEROBIC 10CC EACH  Final   Culture   Final    DIPHTHEROIDS(CORYNEBACTERIUM SPECIES) Note: Standardized susceptibility testing for this organism is not available. Note: Culture results may be compromised due to an excessive volume of blood received in culture bottles. [facility]formed at [facility]    Report Status 10/20/2014 FINAL  Final  Urine culture     Status: None   Collection Time: 10/16/14  8:59 PM  Result Value Ref Range Status   Specimen Description URINE, CLEAN CATCH  Final   Special Requests NONE  Final   Colony Count NO GROWTH Performed at [facility]   Final   Culture NO GROWTH Performed at [facility]   Final   Report Status 10/17/2014 FINAL  Final     Studies: Nm Pulmonary Perf And Vent  10/19/2014   CLINICAL DATA:  Shortness of breath, dyspnea, persistent cough, history hypertension, pneumonia, type 2 diabetes, renal transplant, question pulmonary embolism  EXAM: NUCLEAR MEDICINE VENTILATION - PERFUSION LUNG SCAN  TECHNIQUE: Ventilation images  were obtained in multiple projections using inhaled aerosol Tc-5675m DTPA. Perfusion images were obtained in multiple projections after intravenous injection of Tc-3875m MAA.  RADIOPHARMACEUTICALS:  40 mCi Technetium-3275m DTPA aerosol inhalation and 6 mCi Technetium-4375m MAA IV  COMPARISON:  None; correlation chest radiograph 10/18/2014  FINDINGS: Ventilation: Patchy impairment of ventilation within both upper lobes and LEFT lower lobe.  Perfusion: Normal.  No segmental or subsegmental perfusion defects.  Chest radiograph demonstrates significant BILATERAL pulmonary infiltrates consistent with pneumonia or less likely edema.  IMPRESSION: Normal perfusion lung scan.  Patchy impairment of ventilation in both lungs consistent with parenchymal lung disease/infiltrates as noted on most recent chest radiograph.   Electronically Signed   By: Ulyses SouthwardMark  Boles M.D.   On: 10/19/2014 14:21    Scheduled Meds: . acidophilus  1 capsule Oral Daily  . aspirin  81 mg Oral QHS  . [START ON 10/21/2014] cephALEXin  500 mg Oral Daily  . clonazePAM  1 mg Oral QHS  . darbepoetin (ARANESP) injection - NON-DIALYSIS  60 mcg Subcutaneous Q Fri-1800  . ferrous gluconate  324 mg Oral Q breakfast  . fluconazole  100 mg Oral Daily  . furosemide  20 mg Intravenous Once  . insulin aspart  0-9 Units Subcutaneous TID WC  . levothyroxine  75 mcg Oral QAC breakfast  . metoprolol tartrate  12.5 mg Oral BID  . multivitamin with minerals  1 tablet Oral Daily  . mycophenolate  250 mg Oral BID  . pantoprazole  40 mg Oral Daily  . sodium bicarbonate  650 mg Oral TID  . sodium chloride  3 mL Intravenous Q12H  . tacrolimus  0.5 mg Oral QHS  . tacrolimus  1 mg Oral Daily   Continuous Infusions: . heparin 750 Units/hr (10/20/14 0520)    Principal Problem:   Cellulitis of right groin Active Problems:   Chest pain   Sepsis   Anemia   Essential hypertension   GERD (gastroesophageal reflux disease)   Hypothyroidism   UTI (lower urinary tract  infection)   Carotid artery stenosis   Malnutrition of moderate degree   ARF (acute renal failure)   Nausea with vomiting   SOB (shortness of breath)   Pain in the chest   Skin infection   Demand ischemia   Multifocal  atrial tachycardia    Time spent: 40 mins    [facility] MD [facility] Pager [phone]. If 7PM-7AM, please contact night-coverage at [email or web address], password [facility] 10/20/2014, 2:20 PM  LOS: 4 days

## 2014-10-20 NOTE — Progress Notes (Signed)
ANTICOAGULATION CONSULT NOTE - Follow Up Consult  Pharmacy Consult for Heparin Indication: chest pain/ACS  Allergies  Allergen Reactions  . Codeine Nausea And Vomiting  . Elavil [Amitriptyline] Other (See Comments)    unknown  . Iodinated Diagnostic Agents      Renal transplant pt  . Lipitor [Atorvastatin]   . Magnesium Hives    Pt tolerated oral and IV during 06/2014 admission   . Metoclopramide     Other reaction(s): Other (See Comments) "doesn't work"  . Morphine     Other reaction(s): Confusion (intolerance)  . Morphine And Related   . Neurontin [Gabapentin]   . Niacin And Related   . Norvasc [Amlodipine Besylate]   . Talwin [Pentazocine]   . Ultram [Tramadol]     Patient Measurements: Height: 5\' 3"  (160 cm) Weight: 99 lb 8 oz (45.133 kg) (Scale A) IBW/kg (Calculated) : 52.4 Heparin Dosing Weight: 45.1 kg  Vital Signs: Temp: 98.3 F (36.8 C) (06/06 1329) Temp Source: Oral (06/06 1329) BP: 127/59 mmHg (06/06 1329) Pulse Rate: 85 (06/06 1329)  Labs:  Recent Labs  10/18/14 0332  10/18/14 1615 10/19/14 0526 10/19/14 1257 10/19/14 1656 10/19/14 1900 10/19/14 2200 10/20/14 0345 10/20/14 0348 10/20/14 1258  HGB 7.4*  --   --  10.4*  --   --   --   --   --  10.4*  --   HCT 22.6*  --   --  31.1*  --   --   --   --   --  30.5*  --   PLT 157  --   --  132*  --   --   --   --   --  168  --   HEPARINUNFRC  --   --   --   --   --   --  0.17*  --  0.27*  --  0.33  CREATININE 2.30*  --  2.45* 2.53*  --   --   --   --   --  2.40*  --   TROPONINI  --   < > 1.13*  --  1.03* 0.87*  --  0.85*  --   --   --   < > = values in this interval not displayed.  Estimated Creatinine Clearance: 15.3 mL/min (by C-G formula based on Cr of 2.4).  Assessment: CC: 74 yo F admitted on 10/16/2014 with groin pain  PMH: DM, HTN, CKDIII s/p kidney transplant 2004, anemia  AC: Hep gtt started 6/5 for CP and increasing trop to 1.13>>?ACS. VQ negative for PE s/p 2u PRBC on 6/4. Hgb 6.9  to 10.4 today. Heparin level 0.33 in goal.  Goal of Therapy:  Heparin level 0.3-0.7 units/ml Monitor platelets by anticoagulation protocol: Yes   Plan:  Continue IV heparin at 750 units/hr Daily HL and CBC   Meika Earll S. Merilynn Finlandobertson, PharmD, BCPS Clinical Staff Pharmacist Pager [phone]  Misty Stanleyobertson, Steven Basso Stillinger 10/20/2014,1:45 PM

## 2014-10-20 NOTE — Progress Notes (Signed)
S: Co diarrhea from AB but otherwise feels well O:BP 122/60 mmHg  Pulse 100  Temp(Src) 98.4 F (36.9 C) (Oral)  Resp 20  Ht 5\' 3"  (1.6 m)  Wt 45.133 kg (99 lb 8 oz)  BMI 17.63 kg/m2  SpO2 100%  Intake/Output Summary (Last 24 hours) at 10/20/14 0859 Last data filed at 10/20/14 0600  Gross per 24 hour  Intake 1273.46 ml  Output    750 ml  Net 523.46 ml   Weight change: -1.089 kg (-2 lb 6.4 oz) UJW:JXBJYGen:awake and alert CVS: RRR Resp:  basilar crackles Abd: + BS NT ND.  Area of cellulitis healing with eschar Ext: No edema NEURO:CNI Ox3 no asterixis   . acidophilus  1 capsule Oral Daily  . aspirin  81 mg Oral QHS  . clonazePAM  1 mg Oral QHS  . darbepoetin (ARANESP) injection - NON-DIALYSIS  60 mcg Subcutaneous Q Fri-1800  . diltiazem  30 mg Oral Q12H  . ferrous gluconate  324 mg Oral Q breakfast  . fluconazole  100 mg Oral Daily  . furosemide  20 mg Intravenous Once  . insulin aspart  0-9 Units Subcutaneous TID WC  . levothyroxine  75 mcg Oral QAC breakfast  . multivitamin with minerals  1 tablet Oral Daily  . mycophenolate  250 mg Oral BID  . pantoprazole  40 mg Oral Daily  . piperacillin-tazobactam (ZOSYN)  IV  2.25 g Intravenous Q6H  . sodium bicarbonate  650 mg Oral TID  . sodium chloride  3 mL Intravenous Q12H  . tacrolimus  0.5 mg Oral QHS  . tacrolimus  1 mg Oral Daily  . vancomycin  500 mg Intravenous Q48H   Nm Pulmonary Perf And Vent  10/19/2014   CLINICAL DATA:  Shortness of breath, dyspnea, persistent cough, history hypertension, pneumonia, type 2 diabetes, renal transplant, question pulmonary embolism  EXAM: NUCLEAR MEDICINE VENTILATION - PERFUSION LUNG SCAN  TECHNIQUE: Ventilation images were obtained in multiple projections using inhaled aerosol Tc-4039m DTPA. Perfusion images were obtained in multiple projections after intravenous injection of Tc-1839m MAA.  RADIOPHARMACEUTICALS:  40 mCi Technetium-7739m DTPA aerosol inhalation and 6 mCi Technetium-2939m MAA IV   COMPARISON:  None; correlation chest radiograph 10/18/2014  FINDINGS: Ventilation: Patchy impairment of ventilation within both upper lobes and LEFT lower lobe.  Perfusion: Normal.  No segmental or subsegmental perfusion defects.  Chest radiograph demonstrates significant BILATERAL pulmonary infiltrates consistent with pneumonia or less likely edema.  IMPRESSION: Normal perfusion lung scan.  Patchy impairment of ventilation in both lungs consistent with parenchymal lung disease/infiltrates as noted on most recent chest radiograph.   Electronically Signed   By: Ulyses SouthwardMark  Boles M.D.   On: 10/19/2014 14:21   Dg Abd Acute W/chest  10/18/2014   CLINICAL DATA:  Short of breath, nonproductive cough. Renal transplant.  EXAM: DG ABDOMEN ACUTE W/ 1V CHEST  COMPARISON:  Radiographs 10/16/2014  FINDINGS: Mildly enlarged cardiac silhouette. There is new central airspace consolidation within the right upper lobe and right lower lobe. There is patchy airspace disease within both lungs. There is linear peripheral interstitial markings in the right lung.  IMPRESSION: 1. Dense airspace disease in the central right upper lobe and right lower lobe representing pneumonia versus dense edema. 2. Bilateral patchy nodular airspace disease with differential including pulmonary edema versus multifocal pneumonia. 3. Interstitial edema pattern noted particular in the right lung.   Electronically Signed   By: Genevive BiStewart  Edmunds M.D.   On: 10/18/2014 12:30   BMET  Component Value Date/Time   NA 142 10/20/2014 0348   K 3.7 10/20/2014 0348   CL 110 10/20/2014 0348   CO2 18* 10/20/2014 0348   GLUCOSE 96 10/20/2014 0348   BUN 27* 10/20/2014 0348   CREATININE 2.40* 10/20/2014 0348   CALCIUM 8.5* 10/20/2014 0348   GFRNONAA 19* 10/20/2014 0348   GFRAA 22* 10/20/2014 0348   CBC    Component Value Date/Time   WBC 6.6 10/20/2014 0348   RBC 3.66* 10/20/2014 0348   RBC 2.48* 10/17/2014 0940   HGB 10.4* 10/20/2014 0348   HCT 30.5*  10/20/2014 0348   PLT 168 10/20/2014 0348   MCV 83.3 10/20/2014 0348   MCH 28.4 10/20/2014 0348   MCHC 34.1 10/20/2014 0348   RDW 16.6* 10/20/2014 0348   LYMPHSABS 0.9 10/16/2014 1635   MONOABS 0.4 10/16/2014 1635   EOSABS 0.1 10/16/2014 1635   BASOSABS 0.0 10/16/2014 1635     Assessment: 1. 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 Cellulitis [place] Pub[place], improved 3. MAT 4. 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  SP transfusion 4. DM  Plan: 1. Renal fx close to baseline 2. Consider switching to PO AB 3. Suspect abn CXR due to edema, will give dose of lasix today PO 4. Will need to follow prograf level since on cardizem though difficult to get accurate trough levels in hospital  Airanna Partin T

## 2014-10-20 NOTE — Progress Notes (Signed)
ANTICOAGULATION CONSULT NOTE - Follow Up Consult  Pharmacy Consult for heparin Indication: chest pain/ACS    Labs:  Recent Labs  10/18/14 0332  10/18/14 1615 10/19/14 0526 10/19/14 1257 10/19/14 1656 10/19/14 1900 10/19/14 2200 10/20/14 0345 10/20/14 0348  HGB 7.4*  --   --  10.4*  --   --   --   --   --  10.4*  HCT 22.6*  --   --  31.1*  --   --   --   --   --  30.5*  PLT 157  --   --  132*  --   --   --   --   --  168  HEPARINUNFRC  --   --   --   --   --   --  0.17*  --  0.27*  --   CREATININE 2.30*  --  2.45* 2.53*  --   --   --   --   --   --   TROPONINI  --   < > 1.13*  --  1.03* 0.87*  --  0.85*  --   --   < > = values in this interval not displayed.    Assessment: 74yo female remains slightly subtherapeutic on heparin after rate increase.  Goal of Therapy:  Heparin level 0.3-0.7 units/ml   Plan:  Will increase heparin gtt by 1 unit/kg/hr to 750 units/hr and check level in 8hr.  Vernard GamblesVeronda Willa Brocks, PharmD, BCPS  10/20/2014,5:19 AM

## 2014-10-20 NOTE — Progress Notes (Signed)
  Echocardiogram 2D Echocardiogram has been performed.  Arvil ChacoFoster, Daylyn Christine 10/20/2014, 3:03 PM

## 2014-10-21 DIAGNOSIS — N184 Chronic kidney disease, stage 4 (severe): Secondary | ICD-10-CM

## 2014-10-21 LAB — RENAL FUNCTION PANEL
Albumin: 3 g/dL — ABNORMAL LOW (ref 3.5–5.0)
Anion gap: 14 (ref 5–15)
BUN: 24 mg/dL — AB (ref 6–20)
CO2: 25 mmol/L (ref 22–32)
Calcium: 8.8 mg/dL — ABNORMAL LOW (ref 8.9–10.3)
Chloride: 104 mmol/L (ref 101–111)
Creatinine, Ser: 2.18 mg/dL — ABNORMAL HIGH (ref 0.44–1.00)
GFR calc Af Amer: 25 mL/min — ABNORMAL LOW (ref >60)
GFR, EST NON AFRICAN AMERICAN: 22 mL/min — AB (ref >60)
GLUCOSE: 88 mg/dL (ref 65–99)
Phosphorus: 4.5 mg/dL (ref 2.5–4.6)
Potassium: 2.8 mmol/L — ABNORMAL LOW (ref 3.5–5.1)
Sodium: 143 mmol/L (ref 135–145)

## 2014-10-21 LAB — CBC
HCT: 31 % — ABNORMAL LOW (ref 36.0–46.0)
Hemoglobin: 10.5 g/dL — ABNORMAL LOW (ref 12.0–15.0)
MCH: 28.4 pg (ref 26.0–34.0)
MCHC: 33.9 g/dL (ref 30.0–36.0)
MCV: 83.8 fL (ref 78.0–100.0)
Platelets: 172 10*3/uL (ref 150–400)
RBC: 3.7 MIL/uL — AB (ref 3.87–5.11)
RDW: 15.9 % — AB (ref 11.5–15.5)
WBC: 3.9 10*3/uL — AB (ref 4.0–10.5)

## 2014-10-21 LAB — GLUCOSE, CAPILLARY
Glucose-Capillary: 109 mg/dL — ABNORMAL HIGH (ref 65–99)
Glucose-Capillary: 226 mg/dL — ABNORMAL HIGH (ref 65–99)

## 2014-10-21 LAB — HEPARIN LEVEL (UNFRACTIONATED): HEPARIN UNFRACTIONATED: 0.14 [IU]/mL — AB (ref 0.30–0.70)

## 2014-10-21 MED ORDER — METOPROLOL TARTRATE 25 MG PO TABS
25.0000 mg | ORAL_TABLET | Freq: Two times a day (BID) | ORAL | Status: DC
  Administered 2014-10-21: 25 mg via ORAL
  Filled 2014-10-21: qty 1

## 2014-10-21 MED ORDER — CEPHALEXIN 500 MG PO CAPS
500.0000 mg | ORAL_CAPSULE | Freq: Two times a day (BID) | ORAL | Status: DC
  Administered 2014-10-21: 500 mg via ORAL
  Filled 2014-10-21 (×4): qty 1

## 2014-10-21 MED ORDER — POTASSIUM CHLORIDE CRYS ER 20 MEQ PO TBCR
40.0000 meq | EXTENDED_RELEASE_TABLET | ORAL | Status: AC
  Administered 2014-10-21 (×2): 40 meq via ORAL
  Filled 2014-10-21 (×2): qty 2

## 2014-10-21 MED ORDER — SODIUM BICARBONATE 650 MG PO TABS: 650.0000 mg | ORAL_TABLET | Freq: Two times a day (BID) | ORAL | Status: DC

## 2014-10-21 MED ORDER — CEPHALEXIN 500 MG PO CAPS: 500.0000 mg | ORAL_CAPSULE | Freq: Two times a day (BID) | ORAL | Status: DC

## 2014-10-21 MED ORDER — HEPARIN SODIUM (PORCINE) 5000 UNIT/ML IJ SOLN: 5000.0000 [IU] | Freq: Three times a day (TID) | INTRAMUSCULAR | Status: DC

## 2014-10-21 MED ORDER — SODIUM BICARBONATE 650 MG PO TABS
650.0000 mg | ORAL_TABLET | Freq: Two times a day (BID) | ORAL | Status: DC
  Administered 2014-10-21: 650 mg via ORAL
  Filled 2014-10-21 (×2): qty 1

## 2014-10-21 NOTE — Progress Notes (Signed)
ANTICOAGULATION CONSULT NOTE - Follow Up Consult  Pharmacy Consult for Heparin Indication: chest pain/ACS  Allergies  Allergen Reactions  . Codeine Nausea And Vomiting  . Elavil [Amitriptyline] Other (See Comments)    unknown  . Iodinated Diagnostic Agents      Renal transplant pt  . Lipitor [Atorvastatin]   . Magnesium Hives    Pt tolerated oral and IV during 06/2014 admission   . Metoclopramide     Other reaction(s): Other (See Comments) "doesn't work"  . Morphine     Other reaction(s): Confusion (intolerance)  . Morphine And Related   . Neurontin [Gabapentin]   . Niacin And Related   . Norvasc [Amlodipine Besylate]   . Talwin [Pentazocine]   . Ultram [Tramadol]     Patient Measurements: Height: 5\' 3"  (160 cm) Weight: 99 lb 10.4 oz (45.2 kg) (Scale A) IBW/kg (Calculated) : 52.4 Heparin Dosing Weight: 45.1 kg  Vital Signs: Temp: 97.9 F (36.6 C) (06/07 0523) Temp Source: Oral (06/07 0523) BP: 133/64 mmHg (06/07 0523) Pulse Rate: 84 (06/07 0523)  Labs:  Recent Labs  10/19/14 0526 10/19/14 1257 10/19/14 1656  10/19/14 2200 10/20/14 0345 10/20/14 0348 10/20/14 1258 10/21/14 0405  HGB 10.4*  --   --   --   --   --  10.4*  --  10.5*  HCT 31.1*  --   --   --   --   --  30.5*  --  31.0*  PLT 132*  --   --   --   --   --  168  --  172  HEPARINUNFRC  --   --   --   < >  --  0.27*  --  0.33 0.14*  CREATININE 2.53*  --   --   --   --   --  2.40*  --  2.18*  TROPONINI  --  1.03* 0.87*  --  0.85*  --   --   --   --   < > = values in this interval not displayed.  Estimated Creatinine Clearance: 16.9 mL/min (by C-G formula based on Cr of 2.18).  Assessment: 74 y.o. female continues on heparin for r/o ACS. Heparin level subtherapeutic (0.14). CBC stable. No bleeding noted, no issues with line per RN.  Goal of Therapy:  Heparin level 0.3-0.7 units/ml Monitor platelets by anticoagulation protocol: Yes   Plan:  Increase IV heparin to 900 units/hr F/u 8 hr  heparin level  Christoper Fabianaron Aalliyah Kilker, PharmD, BCPS Clinical pharmacist, pager [phone]  10/21/2014,6:28 AM

## 2014-10-21 NOTE — Discharge Summary (Signed)
Physician Discharge Summary  Deanna ShortsLinda T Grossberg ZOX:[id number] DOB: [birth year] DOA: 10/16/2014  PCP: Samuel JesterBUTLER, CYNTHIA, DO  Admit date: 10/16/2014 Discharge date: 10/21/2014  Time spent:  65 minutes  Recommendations for Outpatient Follow-up:  1. Follow-up with BUTLER, CYNTHIA, DO in 1 week. On Follow up patient will need a basic metabolic profile done to follow-up on electrolytes and renal function. Patient need a CBC done to follow-up on H&H. Patient's mons pubis cellulitis will need to be reassessed.  Discharge Diagnoses:  Principal Problem:   Cellulitis of right groin Active Problems:   Chest pain   Sepsis   Anemia   Essential hypertension   GERD (gastroesophageal reflux disease)   Hypothyroidism   UTI (lower urinary tract infection)   Carotid artery stenosis   Malnutrition of moderate degree   ARF (acute renal failure)   Nausea with vomiting   SOB (shortness of breath)   Pain in the chest   Skin infection   Demand ischemia   Multifocal atrial tachycardia   Discharge Condition: Stable and improved  Diet recommendation: Heart healthy diet  Filed Weights   10/19/14 0431 10/20/14 0500 10/21/14 0523  Weight: 46.222 kg (101 lb 14.4 oz) 45.133 kg (99 lb 8 oz) 45.2 kg (99 lb 10.4 oz)    History of present illness:  Per Dr Helene ShoeNiu Kerisha T Kidney is a 74 y.o. 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   Patient reported that she noticed a small boil over her right mons pubis 6 days ago. It become more tender and seemed to have enlarged in size. She did not have a fever, chills. No injury to the area. She reported that she was being treated for UTI with cipro currently. She took cipro for two days so far, but still had burning on urination.   On admission, patient denied fever, chills, running nose, ear pain, headaches,  cough, chest pain, SOB, abdominal pain, diarrhea, constipation, dysuria, urgency, frequency, hematuria, joint pain or leg swelling. No unilateral weakness, numbness or tingling sensations. No vision change or hearing loss.  In ED, patient was found to have potassium 6.0, worsening renal function compared to creatinine data on 11/19/09. EKG had a T-wave peaking. WBC 4.6, temperature normal, tachycardia. Patient was admitted to inpatient for further evaluation and treatment  Hospital Course:  #1 chest pain/demand ischemia Patient with complaints of chest pain with a chest heaviness during the hospitalization as well as shortness of breath. Patient was noted to also be tachycardic with nausea and vomiting. Cardiac workup was initiated and patient was noted to have some elevated troponins. It was felt patient's chest pain was likely secondary to demand ischemia. Patient with multiple risk factors of diabetes, chronic kidney disease, hypertension, hyperlipidemia. EKG was done which showed some ST depression in leads 2, aVF, V4 through V6. Cardiac enzymes with elevated troponins. 2-D echo pending. Patient was seen in consultation by cardiology who felt patient did have a demand ischemia with tachycardia in the setting of significant anemia . Patient was transfused 2 units packed red blood cells with clinical improvement. VQ scan negative for PE. Patient was subsequently placed back on her metoprolol. 2-D echo which was obtained at 74-year-old 60-65%, diastolic dysfunction, elevated LV filling pressure upper size normal left atrial size, trivial MR. Patient was initially placed on IV heparin which was subsequently discontinued as echocardiogram was reassuring. It was felt per cardiology  that patient was stable to be discharged and no further ischemic workup was needed at this time given the lack of wall motion abnormalities. Patient will follow-up as outpatient.   #2 MAT June hospitalization patient was noted to go  into multi atrial tachycardia. Cardiology was consulted. It was felt patient's multi-atrial tachycardia was likely secondary to anemia and demand ischemia. Patient was started on a Cardizem drip, however was discontinued secondary to borderline blood pressure. Patient went back in normal sinus rhythm. VQ scan negative for PE. Patient was subsequently transitioned to oral Cardizem which was changed back to oral metoprolol per cardiology. Patient will follow-up as outpatient.   #3 cellulitis of mons pubis with sepsis On admission patient was admitted with a cellulitis of mons pubis with sepsis. Patient was initially placed empirically on broad-spectrum IV antibiotics secondary to her immunocompromise state. Patient's cellulitis improved and patient was transitioned to oral Keflex. Patient be discharged home on 5 more days of oral Keflex to complete a course of antibiotics therapy. Patient will follow-up with PCP as outpatient.  #4 acute on chronic kidney disease stage III Likely secondary to prerenal azotemia as patient is noted to be hypotensive and also septic on admission. Patient's blood pressure responded to IV fluids. Renal transplant ultrasound with normal-appearing right renal transplant with normal perfusion. Patient was seen in consultation with nephrology who followed the patient throughout the hospitalization. Patient was noted to get into volume overload was subsequently given a few doses of Lasix with clinical improvement. Patient's renal function started to trend back down such that by day of discharge her creatinine was down to 2.18. Patient was maintained on her home regimen of CellCept and Prograf. Patient will follow-up with nephrology as outpatient as previously scheduled.   #5 hyperkalemia On admission patient was noted to be hypokalemic. This was felt to be secondary to acute on chronic kidney disease stage III. Patient was diuresed with some Lasix with resolution of her hyperkalemia.    #6 anemia Likely anemia of chronic disease. Anemia panel consistent with anemia of chronic disease. On admission patient was noted to have a hemoglobin of 7.4. Due to patient having elevated troponin and going to MAT patient was subsequently transfused 2 units of packed red blood cells with appropriate response. Patient's hemoglobin stabilized at 10.5 patient improved clinically and symptomatically will be discharged in stable and improved condition. Outpatient follow-up.  #7 hypotension Likely secondary to problem #1. Blood pressure responded to IV fluids. Saline lock IV fluids. Patient was placed on IVF. Hypotension resolved.  #8 well-controlled diabetes mellitus Hemoglobin A1c is 5.9. CBGs have ranged from 109-226. Continued on Lantus at 6 units daily. Sliding scale insulin.  #9 hypothyroidism TSH decreased at 0.046. Free T4 within normal limits. Continue home regimen of Synthroid at . Will likely need repeat thyroid function studies done in about 4-6 weeks.  #10 gastroesophageal reflux disease PPI.  #11 hypertension Patient was changed from Cardizem to metoprolol per cardiology.  #12 Hypokalemia Secondary to diureses. Replete.  #13 moderate protein calorie malnutrition Continued on nutritional supplements.   Procedures:  Renal transplant ultrasound 10/17/2014  Chest x-ray 10/16/2014  V/Q scan 10/19/14  2 units PRBC 10/18/14  2-D echo pending  Consultations:  Nephrology: Dr. Allena Katz 10/17/2014   Cardiology: Dr. Wyline Mood 10/18/2014  Discharge Exam: Filed Vitals:   10/21/14 1412  BP: 144/68  Pulse: 81  Temp: 98.2 F (36.8 C)  Resp: 20    General: NAD Cardiovascular: RRR Respiratory: CTAB  Discharge Instructions  Discharge Instructions    Diet - low sodium heart healthy    Complete by:  As directed      Discharge instructions    Complete by:  As directed   Follow up with BUTLER, CYNTHIA, DO in 1 week.     Increase activity slowly    Complete by:   As directed           Current Discharge Medication List    START taking these medications   Details  sodium bicarbonate 650 MG tablet Take 1 tablet (650 mg total) by mouth 2 (two) times daily. Qty: 60 tablet, Refills: 0      CONTINUE these medications which have CHANGED   Details  cephALEXin (KEFLEX) 500 MG capsule Take 1 capsule (500 mg total) by mouth 2 (two) times daily. Take for 5 days then stop. Qty: 10 capsule, Refills: 0      CONTINUE these medications which have NOT CHANGED   Details  aspirin 81 MG tablet Take 81 mg by mouth at bedtime.     clonazePAM (KLONOPIN) 1 MG tablet Take 1 mg by mouth at bedtime.    ferrous gluconate (FERGON) 324 MG tablet Take 324 mg by mouth daily with breakfast.     fluconazole (DIFLUCAN) 100 MG tablet Take 100 mg by mouth daily.    insulin glargine (LANTUS) 100 UNIT/ML injection Inject 8 Units into the skin at bedtime.     levothyroxine (SYNTHROID, LEVOTHROID) 75 MCG tablet Take 75 mcg by mouth daily before breakfast.    metoprolol (LOPRESSOR) 50 MG tablet Take 50 mg by mouth 2 (two) times daily.    mycophenolate (CELLCEPT) 250 MG capsule Take 250 mg by mouth 2 (two) times daily.    pantoprazole (PROTONIX) 40 MG tablet Take 40 mg by mouth daily. Refills: 5    Probiotic Product (PROBIOTIC DAILY PO) Take 1 tablet by mouth daily.    tacrolimus (PROGRAF) 0.5 MG capsule Take 0.5 mg by mouth 2 (two) times daily. 1 mg in the morning and 0.5 mg nightly.    traMADol (ULTRAM) 50 MG tablet Take 50 mg by mouth every 6 (six) hours as needed for moderate pain.     hyoscyamine (LEVSIN, ANASPAZ) 0.125 MG tablet Take 0.125 mg by mouth every 4 (four) hours as needed for cramping.       STOP taking these medications     ciprofloxacin (CIPRO) 250 MG tablet      HYDROcodone-acetaminophen (NORCO) 5-325 MG per tablet        Allergies  Allergen Reactions  . Codeine Nausea And Vomiting  . Elavil [Amitriptyline] Other (See Comments)    unknown   . Iodinated Diagnostic Agents      Renal transplant pt  . Lipitor [Atorvastatin]   . Magnesium Hives    Pt tolerated oral and IV during 06/2014 admission   . Metoclopramide     Other reaction(s): Other (See Comments) "doesn't work"  . Morphine     Other reaction(s): Confusion (intolerance)  . Morphine And Related   . Neurontin [Gabapentin]   . Niacin And Related   . Norvasc [Amlodipine Besylate]   . Talwin [Pentazocine]   . Ultram [Tramadol]    Follow-up Information    Please follow up.   Why:  See your Physicain for recheck next week      Follow up with BUTLER, CYNTHIA, DO. Schedule an appointment as soon as possible for a visit in 1 week.   Contact information:   [id number] [place] [address] [phone]       Follow up with Dyke Maes, MD.   Specialty:  Nephrology   Why:  f/u as scheduled   Contact information:   [address] [place] [address] [phone]        The results of significant diagnostics from this hospitalization (including imaging, microbiology, ancillary and laboratory) are listed below for reference.    Significant Diagnostic Studies: US Renal Transplant W/doppler  10/17/2014   CLINICAL DATA:  Acute renal failure.  EXAM: ULTRASOUND OF RENAL TRANSPLANT WITH DOPPLER  TECHNIQUE: Ultrasound examination of the renal transplant was performed with gray-scale, color and duplex doppler evaluation.  COMPARISON:  02/12/2010  FINDINGS: Transplant kidney location:  Right lower quadrant.  Transplant kidney:  Length: 10.8 cm. Normal in size and parenchymal echogenicity. No evidence of mass or hydronephrosis.  Color flow in the main renal artery:  Present  Color flow in the main renal vein:  Present  Duplex Doppler Evaluation  Resistive index in main renal artery: 0.93  Venous waveform in main renal vein:  Present  Resistive index in upper pole intrarenal artery: 0.76  Resistive index in lower pole intrarenal artery: 0.71  Bladder: Normal for  degree of bladder distention.  Other findings: Right ureteral jet identified. Transplant bed appears normal.  IMPRESSION: Normal appearing right renal transplant with normal perfusion.   Electronically Signed   By: Francene Boyers M.D.   On: 10/17/2014 18:12   Nm Pulmonary Perf And Vent  10/19/2014   CLINICAL DATA:  Shortness of breath, dyspnea, persistent cough, history hypertension, pneumonia, type 2 diabetes, renal transplant, question pulmonary embolism  EXAM: NUCLEAR MEDICINE VENTILATION - PERFUSION LUNG SCAN  TECHNIQUE: Ventilation images were obtained in multiple projections using inhaled aerosol Tc-21m DTPA. Perfusion images were obtained in multiple projections after intravenous injection of Tc-67m MAA.  RADIOPHARMACEUTICALS:  40 mCi Technetium-52m DTPA aerosol inhalation and 6 mCi Technetium-34m MAA IV  COMPARISON:  None; correlation chest radiograph 10/18/2014  FINDINGS: Ventilation: Patchy impairment of ventilation within both upper lobes and LEFT lower lobe.  Perfusion: Normal.  No segmental or subsegmental perfusion defects.  Chest radiograph demonstrates significant BILATERAL pulmonary infiltrates consistent with pneumonia or less likely edema.  IMPRESSION: Normal perfusion lung scan.  Patchy impairment of ventilation in both lungs consistent with parenchymal lung disease/infiltrates as noted on most recent chest radiograph.   Electronically Signed   By: Ulyses Southward M.D.   On: 10/19/2014 14:21   Dg Chest Port 1 View  10/16/2014   CLINICAL DATA:  Abscess.  UTI for 3 days.  EXAM: PORTABLE CHEST - 1 VIEW  COMPARISON:  None.  FINDINGS: The heart size and mediastinal contours are within normal limits. Both lungs are clear. The visualized skeletal structures are unremarkable.  IMPRESSION: No active disease.   Electronically Signed   By: Signa Kell M.D.   On: 10/16/2014 21:09   Dg Abd Acute W/chest  10/18/2014   CLINICAL DATA:  Short of breath, nonproductive cough. Renal transplant.  EXAM: DG  ABDOMEN ACUTE W/ 1V CHEST  COMPARISON:  Radiographs 10/16/2014  FINDINGS: Mildly enlarged cardiac silhouette. There is new central airspace consolidation within the right upper lobe and right lower lobe. There is patchy airspace disease within both lungs. There is linear peripheral interstitial markings in the right lung.  IMPRESSION: 1. Dense airspace disease in the central right upper lobe and right lower lobe representing pneumonia versus dense edema. 2. Bilateral patchy nodular airspace disease with differential including  pulmonary edema versus multifocal pneumonia. 3. Interstitial edema pattern noted particular in the right lung.   Electronically Signed   By: Genevive Bi M.D.   On: 10/18/2014 12:30    Microbiology: Recent Results (from the past 240 hour(s))  Culture, blood (routine x 2)     Status: None (Preliminary result)   Collection Time: 10/16/14  8:21 PM  Result Value Ref Range Status   Specimen Description BLOOD RIGHT FOREARM  Final   Special Requests BOTTLES DRAWN AEROBIC AND ANAEROBIC 10CC EACH  Final   Culture   Final           BLOOD CULTURE RECEIVED NO GROWTH TO DATE CULTURE WILL BE HELD FOR 5 DAYS BEFORE ISSUING A FINAL NEGATIVE REPORT Note: Culture results may be compromised due to an excessive volume of blood received in culture bottles. Performed at [facility]    Report Status PENDING  Incomplete  Culture, blood (routine x 2)     Status: None   Collection Time: 10/16/14  8:35 PM  Result Value Ref Range Status   Specimen Description BLOOD LEFT FOREARM  Final   Special Requests BOTTLES DRAWN AEROBIC AND ANAEROBIC 10CC EACH  Final   Culture   Final    DIPHTHEROIDS(CORYNEBACTERIUM SPECIES) Note: Standardized susceptibility testing for this organism is not available. Note: Culture results may be compromised due to an excessive volume of blood received in culture bottles. Performed at [facility]    Report Status 10/20/2014 FINAL  Final  Urine culture      Status: None   Collection Time: 10/16/14  8:59 PM  Result Value Ref Range Status   Specimen Description URINE, CLEAN CATCH  Final   Special Requests NONE  Final   Colony Count NO GROWTH Performed at [facility]   Final   Culture NO GROWTH Performed at [facility]   Final   Report Status 10/17/2014 FINAL  Final     Labs: Basic Metabolic Panel:  Recent Labs Lab 10/18/14 0332 10/18/14 1615 10/19/14 0526 10/20/14 0348 10/21/14 0405  NA 137 138 138 142 143  K 5.2* 5.0 3.9 3.7 2.8*  CL 113* 111 108 110 104  CO2 15* 18* 16* 18* 25  GLUCOSE 194* 184* 133* 96 88  BUN 38* 35* 33* 27* 24*  CREATININE 2.30* 2.45* 2.53* 2.40* 2.18*  CALCIUM 8.0* 8.4* 8.3* 8.5* 8.8*  MG  --  1.1* 2.7* 1.9  --   PHOS 3.5  --  4.0 3.5 4.5   Liver Function Tests:  Recent Labs Lab 10/16/14 1635 10/18/14 0332 10/19/14 0526 10/20/14 0348 10/21/14 0405  AST 12*  --   --   --   --   ALT 11*  --   --   --   --   ALKPHOS 72  --   --   --   --   BILITOT 0.1*  --   --   --   --   PROT 6.6  --   --   --   --   ALBUMIN 3.7 2.9* 2.7* 2.7* 3.0*   No results for input(s): LIPASE, AMYLASE in the last 168 hours. No results for input(s): AMMONIA in the last 168 hours. CBC:  Recent Labs Lab 10/16/14 1635 10/17/14 0526 10/17/14 0940 10/18/14 0332 10/19/14 0526 10/20/14 0348 10/21/14 0405  WBC 4.6 4.1  --  6.2 7.0 6.6 3.9*  NEUTROABS 3.2  --   --   --   --   --   --  HGB 9.0* 6.9* 7.1* 7.4* 10.4* 10.4* 10.5*  HCT 27.8* 21.0* 22.0* 22.6* 31.1* 30.5* 31.0*  MCV 88.5 87.9  --  88.3 83.4 83.3 83.8  PLT 175 137*  --  157 132* 168 172   Cardiac Enzymes:  Recent Labs Lab 10/18/14 1127 10/18/14 1615 10/19/14 1257 10/19/14 1656 10/19/14 2200  TROPONINI 0.25* 1.13* 1.03* 0.87* 0.85*   BNP: BNP (last 3 results) No results for input(s): BNP in the last 8760 hours.  ProBNP (last 3 results) No results for input(s): PROBNP in the last 8760 hours.  CBG:  Recent Labs Lab  10/20/14 1058 10/20/14 1604 10/20/14 2028 10/21/14 0528 10/21/14 1122  GLUCAP 158* 140* 172* 109* 226*       Signed:  THOMPSON,DANIEL MD [facility] 10/21/2014, 2:36 PM

## 2014-10-21 NOTE — Progress Notes (Signed)
Patient Name: [name] Date of Encounter: 10/21/2014  Primary Cardiology: previously Dr. Daleen Squibb   Principal Problem:   Cellulitis of right groin Active Problems:   Anemia   Essential hypertension   GERD (gastroesophageal reflux disease)   Hypothyroidism   UTI (lower urinary tract infection)   Carotid artery stenosis   Malnutrition of moderate degree   Sepsis   ARF (acute renal failure)   Chest pain   Nausea with vomiting   SOB (shortness of breath)   Pain in the chest   Skin infection   Demand ischemia   Multifocal atrial tachycardia    SUBJECTIVE  Denies any CP or SOB. States the only time she was having chest discomfort was when she was receiving breathing treatment, she did not have any chest discomfort prior to that, and no chest discomfort since.   CURRENT MEDS . acidophilus  1 capsule Oral Daily  . aspirin  81 mg Oral QHS  . cephALEXin  500 mg Oral Daily  . clonazePAM  1 mg Oral QHS  . darbepoetin (ARANESP) injection - NON-DIALYSIS  60 mcg Subcutaneous Q Fri-1800  . ferrous gluconate  324 mg Oral Q breakfast  . fluconazole  100 mg Oral Daily  . furosemide  20 mg Intravenous Once  . insulin aspart  0-9 Units Subcutaneous TID WC  . levothyroxine  75 mcg Oral QAC breakfast  . metoprolol tartrate  12.5 mg Oral BID  . multivitamin with minerals  1 tablet Oral Daily  . mycophenolate  250 mg Oral BID  . pantoprazole  40 mg Oral Daily  . potassium chloride  40 mEq Oral Q4H  . sodium bicarbonate  650 mg Oral TID  . sodium chloride  3 mL Intravenous Q12H  . tacrolimus  0.5 mg Oral QHS  . tacrolimus  1 mg Oral Daily    OBJECTIVE  Filed Vitals:   10/20/14 1329 10/20/14 2023 10/20/14 2314 10/21/14 0523  BP: 127/59 125/65 134/75 133/64  Pulse: 85 93 96 84  Temp: 98.3 F (36.8 C) 97.9 F (36.6 C)  97.9 F (36.6 C)  TempSrc: Oral Oral  Oral  Resp: Height:      Weight:    99 lb 10.4 oz (45.2 kg)  SpO2: 99% 97%  96%    Intake/Output Summary  (Last 24 hours) at 10/21/14 0820 Last data filed at 10/21/14 0449  Gross per 24 hour  Intake 1301.5 ml  Output   1775 ml  Net -473.5 ml   Filed Weights   10/19/14 0431 10/20/14 0500 10/21/14 0523  Weight: 101 lb 14.4 oz (46.222 kg) 99 lb 8 oz (45.133 kg) 99 lb 10.4 oz (45.2 kg)    PHYSICAL EXAM  General: Pleasant, NAD. Neuro: Alert and oriented X 3. Moves all extremities spontaneously. Psych: Normal affect. HEENT:  Normal  Neck: Supple without bruits or JVD. Lungs:  Resp regular and unlabored. CTA Heart: RRR no s3, s4, or murmurs. Abdomen: Soft, non-tender, non-distended, BS + x 4.  Extremities: No clubbing, cyanosis or edema. DP/PT/Radials 2+ and equal bilaterally.  Accessory Clinical Findings  CBC  Recent Labs  10/20/14 0348 10/21/14 0405  WBC 6.6 3.9*  HGB 10.4* 10.5*  HCT 30.5* 31.0*  MCV 83.3 83.8  PLT 168 172   Basic Metabolic Panel  Recent Labs  10/19/14 0526 10/20/14 0348 10/21/14 0405  NA 138 142 143  K 3.9 3.7 2.8*  CL 108 110 104  CO2 16* 18* 25  GLUCOSE 133* 96 88  BUN 33* 27* 24*  CREATININE 2.53* 2.40* 2.18*  CALCIUM 8.3* 8.5* 8.8*  MG 2.7* 1.9  --   PHOS 4.0 3.5 4.5   Liver Function Tests  Recent Labs  10/20/14 0348 10/21/14 0405  ALBUMIN 2.7* 3.0*   Cardiac Enzymes  Recent Labs  10/19/14 1257 10/19/14 1656 10/19/14 2200  TROPONINI 1.03* 0.87* 0.85*    TELE NSR with HR 70-80s    ECG  No new EKG  Echocardiogram  pending    Radiology/Studies  [place] Renal Transplant W/doppler  10/17/2014   CLINICAL DATA:  Acute renal failure.  EXAM: ULTRASOUND OF RENAL TRANSPLANT WITH DOPPLER  TECHNIQUE: Ultrasound examination of the renal transplant was performed with gray-scale, color and duplex doppler evaluation.  COMPARISON:  02/12/2010  FINDINGS: Transplant kidney location:  Right lower quadrant.  Transplant kidney:  Length: 10.8 cm. Normal in size and parenchymal echogenicity. No evidence of mass or hydronephrosis.  Color flow in  the main renal artery:  Present  Color flow in the main renal vein:  Present  Duplex Doppler Evaluation  Resistive index in main renal artery: 0.93  Venous waveform in main renal vein:  Present  Resistive index in upper pole intrarenal artery: 0.76  Resistive index in lower pole intrarenal artery: 0.71  Bladder: Normal for degree of bladder distention.  Other findings: Right ureteral jet identified. Transplant bed appears normal.  IMPRESSION: Normal appearing right renal transplant with normal perfusion.   Electronically Signed   By: Francene BoyersJames  Maxwell M.D.   On: 10/17/2014 18:12   Nm Pulmonary Perf And Vent  10/19/2014   CLINICAL DATA:  Shortness of breath, dyspnea, persistent cough, history hypertension, pneumonia, type 2 diabetes, renal transplant, question pulmonary embolism  EXAM: NUCLEAR MEDICINE VENTILATION - PERFUSION LUNG SCAN  TECHNIQUE: Ventilation images were obtained in multiple projections using inhaled aerosol Tc-6478m DTPA. Perfusion images were obtained in multiple projections after intravenous injection of Tc-2278m MAA.  RADIOPHARMACEUTICALS:  40 mCi Technetium-9478m DTPA aerosol inhalation and 6 mCi Technetium-2978m MAA IV  COMPARISON:  None; correlation chest radiograph 10/18/2014  FINDINGS: Ventilation: Patchy impairment of ventilation within both upper lobes and LEFT lower lobe.  Perfusion: Normal.  No segmental or subsegmental perfusion defects.  Chest radiograph demonstrates significant BILATERAL pulmonary infiltrates consistent with pneumonia or less likely edema.  IMPRESSION: Normal perfusion lung scan.  Patchy impairment of ventilation in both lungs consistent with parenchymal lung disease/infiltrates as noted on most recent chest radiograph.   Electronically Signed   By: Ulyses SouthwardMark  Boles M.D.   On: 10/19/2014 14:21   Dg Chest Port 1 View  10/16/2014   CLINICAL DATA:  Abscess.  UTI for 3 days.  EXAM: PORTABLE CHEST - 1 VIEW  COMPARISON:  None.  FINDINGS: The heart size and mediastinal contours are  within normal limits. Both lungs are clear. The visualized skeletal structures are unremarkable.  IMPRESSION: No active disease.   Electronically Signed   By: Signa Kellaylor  Stroud M.D.   On: 10/16/2014 21:09   Dg Abd Acute W/chest  10/18/2014   CLINICAL DATA:  Short of breath, nonproductive cough. Renal transplant.  EXAM: DG ABDOMEN ACUTE W/ 1V CHEST  COMPARISON:  Radiographs 10/16/2014  FINDINGS: Mildly enlarged cardiac silhouette. There is new central airspace consolidation within the right upper lobe and right lower lobe. There is patchy airspace disease within both lungs. There is linear peripheral interstitial markings in the right lung.  IMPRESSION: 1. Dense airspace disease in the central right upper lobe and  right lower lobe representing pneumonia versus dense edema. 2. Bilateral patchy nodular airspace disease with differential including pulmonary edema versus multifocal pneumonia. 3. Interstitial edema pattern noted particular in the right lung.   Electronically Signed   By: Genevive Bi M.D.   On: 10/18/2014 12:30    ASSESSMENT AND PLAN  Ms. 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 During admission had episode of chest pain and we were consulted.   1. Elevated trop likely demand ischemia during MAT and CKD  - trop 1.03 --> 0.87  - V/Q scan negative for PE, pending echo  - would be a poor candidate for invasive workup given poor renal function  - initially made NPO past night in case echo is severely abnormal, result still not in Epic, it was done yesterday, I have contacted the [facility] echo reader to expedite the process. However patient denies any CP during this admission, she states she only had CP during breathing treatment, she had no CP prior to ED visit and no CP since. She would prefer not to have inpt myoview.   - since she is essentially asymptomatic, will continue medical management  -  expect discontinue IV heparin today once echo result come back, IM to decide when to transition IV zosyn to PO med. Likely discharge soon. Outpatient cardiology followup with Dr. Daleen Squibb  Addendum: per verbal report by Dr. Rennis Golden, [facility] reader, problem with epic preventing loading report. EF  60-65%, diastolic dysfunction, elevated LV filling pressure, upper normal LA size, trivial MR. Echo reassuring, will d/c IV heparin and change to subcu  2. MAT  - on metoprolol  BID at home, discontinued due to hypotension? Placed initially on  dilt q12 hrs, which switched back to metoprolol on 6/6  - no further episode of MAT on telemetry, will uptitrate metoprolol to  BID and will keep it at this dose prior to discharge given recent hypotension.  3. 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 Mons Pubis Celllulitis 5. Anemia 6. DM 7. Severe hypokalemia: KCl ordered by primary team  Signed, Azalee Course PA-C Pager: [phone]   I have examined the patient and reviewed assessment and plan and discussed with patient.  Agree with above as stated.  MAT controlled.  Continue metoprolol.  Normal LV function.  OK to d/c from a cardiac standpoint.  No chest pain.  WOuld not pursue ischemic workup at this time given lack of RWMA.  Deanna Gammon S.

## 2014-10-21 NOTE — Progress Notes (Signed)
Patient's telemetry and IV has been discontinued.  Discharge summary has been reviewed with patient and her sister,all questions answered and no further questions at this time.  Lorretta Harp. Jamarius Saha RN

## 2014-10-21 NOTE — Progress Notes (Signed)
S: Co diarrhea from AB but otherwise feels well O:BP 133/64 mmHg  Pulse 84  Temp(Src) 97.9 F (36.6 C) (Oral)  Resp 16  Ht 5\' 3"  (1.6 m)  Wt 45.2 kg (99 lb 10.4 oz)  BMI 17.66 kg/m2  SpO2 96%  Intake/Output Summary (Last 24 hours) at 10/21/14 0843 Last data filed at 10/21/14 0449  Gross per 24 hour  Intake 1301.5 ml  Output   1775 ml  Net -473.5 ml   Weight change: 0.067 kg (2.4 oz) AVW:UJWJXGen:awake and alert CVS: RRR Resp:  basilar crackles Abd: + BS NT ND.  Area of cellulitis healing with eschar Ext: No edema NEURO:CNI Ox3 no asterixis   . acidophilus  1 capsule Oral Daily  . aspirin  81 mg Oral QHS  . cephALEXin  500 mg Oral Daily  . clonazePAM  1 mg Oral QHS  . darbepoetin (ARANESP) injection - NON-DIALYSIS  60 mcg Subcutaneous Q Fri-1800  . ferrous gluconate  324 mg Oral Q breakfast  . fluconazole  100 mg Oral Daily  . furosemide  20 mg Intravenous Once  . insulin aspart  0-9 Units Subcutaneous TID WC  . levothyroxine  75 mcg Oral QAC breakfast  . metoprolol tartrate  12.5 mg Oral BID  . multivitamin with minerals  1 tablet Oral Daily  . mycophenolate  250 mg Oral BID  . pantoprazole  40 mg Oral Daily  . potassium chloride  40 mEq Oral Q4H  . sodium bicarbonate  650 mg Oral TID  . sodium chloride  3 mL Intravenous Q12H  . tacrolimus  0.5 mg Oral QHS  . tacrolimus  1 mg Oral Daily   Nm Pulmonary Perf And Vent  10/19/2014   CLINICAL DATA:  Shortness of breath, dyspnea, persistent cough, history hypertension, pneumonia, type 2 diabetes, renal transplant, question pulmonary embolism  EXAM: NUCLEAR MEDICINE VENTILATION - PERFUSION LUNG SCAN  TECHNIQUE: Ventilation images were obtained in multiple projections using inhaled aerosol Tc-6163m DTPA. Perfusion images were obtained in multiple projections after intravenous injection of Tc-3363m MAA.  RADIOPHARMACEUTICALS:  40 mCi Technetium-3463m DTPA aerosol inhalation and 6 mCi Technetium-3463m MAA IV  COMPARISON:  None; correlation chest  radiograph 10/18/2014  FINDINGS: Ventilation: Patchy impairment of ventilation within both upper lobes and LEFT lower lobe.  Perfusion: Normal.  No segmental or subsegmental perfusion defects.  Chest radiograph demonstrates significant BILATERAL pulmonary infiltrates consistent with pneumonia or less likely edema.  IMPRESSION: Normal perfusion lung scan.  Patchy impairment of ventilation in both lungs consistent with parenchymal lung disease/infiltrates as noted on most recent chest radiograph.   Electronically Signed   By: Ulyses SouthwardMark  Boles M.D.   On: 10/19/2014 14:21   BMET    Component Value Date/Time   NA 143 10/21/2014 0405   K 2.8* 10/21/2014 0405   CL 104 10/21/2014 0405   CO2 25 10/21/2014 0405   GLUCOSE 88 10/21/2014 0405   BUN 24* 10/21/2014 0405   CREATININE 2.18* 10/21/2014 0405   CALCIUM 8.8* 10/21/2014 0405   GFRNONAA 22* 10/21/2014 0405   GFRAA 25* 10/21/2014 0405   CBC    Component Value Date/Time   WBC 3.9* 10/21/2014 0405   RBC 3.70* 10/21/2014 0405   RBC 2.48* 10/17/2014 0940   HGB 10.5* 10/21/2014 0405   HCT 31.0* 10/21/2014 0405   PLT 172 10/21/2014 0405   MCV 83.8 10/21/2014 0405   MCH 28.4 10/21/2014 0405   MCHC 33.9 10/21/2014 0405   RDW 15.9* 10/21/2014 0405   LYMPHSABS 0.9  10/16/2014 1635   MONOABS 0.4 10/16/2014 1635   EOSABS 0.1 10/16/2014 1635   BASOSABS 0.0 10/16/2014 1635     Assessment: 1. 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 Cellulitis Mons Pubis, improved 3. MAT 4. 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  SP transfusion 4. DM 5. Hypokalemia  Plan: 1.  Replace K 2. Decrease bicarb to BID  3. She can go from renal standpoint once cardiac issues resolved 4. Keflex can be BID, will make the change Miko Markwood T

## 2014-10-23 LAB — CULTURE, BLOOD (ROUTINE X 2): Culture: NO GROWTH

## 2015-01-02 ENCOUNTER — Encounter: Payer: Self-pay | Admitting: Surgery

## 2015-01-05 ENCOUNTER — Other Ambulatory Visit: Payer: Self-pay | Admitting: Surgery

## 2015-01-05 ENCOUNTER — Ambulatory Visit (INDEPENDENT_AMBULATORY_CARE_PROVIDER_SITE_OTHER): Payer: Medicare Other | Admitting: Surgery

## 2015-01-05 ENCOUNTER — Ambulatory Visit (HOSPITAL_COMMUNITY)
Admission: RE | Admit: 2015-01-05 | Discharge: 2015-01-05 | Disposition: A | Discharge status: Home / Self Care | Payer: Medicare Other | Source: Ambulatory Visit | Attending: Surgery | Admitting: Surgery

## 2015-01-05 ENCOUNTER — Encounter: Payer: Self-pay | Admitting: Surgery

## 2015-01-05 VITALS — BP 118/68 | HR 63 | Temp 97.7°F | Resp 16 | Ht 63.0 in | Wt 100.4 lb

## 2015-01-05 DIAGNOSIS — I6523 Occlusion and stenosis of bilateral carotid arteries: Secondary | ICD-10-CM | POA: Insufficient documentation

## 2015-01-05 NOTE — Progress Notes (Signed)
Patient name: [name] MRN: [id number] DOB: 22-Sep-1940 Sex: female     Chief Complaint  Patient presents with  . Re-evaluation    6 month follow up for Carotid Stenosis    HISTORY OF PRESENT ILLNESS:  the patient is back for follow-up.  I initially met her in February 2016 for evaluation of carotid stenosis.  She had an outside ultrasound that revealed greater than 70% right carotid stenosis and less than 50% left carotid stenosis.  Ultrasound in our office identifies 60-79 percent right carotid stenosis.  Medical management was recommended given that she was asymptomatic.    in the interval since I last saw the patient, she was hospitalized for a kidney infection.  She also suffered aspiration pneumonia which led to intubation.  She has recovered from this.  She also significant amount of weight and is trying to gain that back.  The patient has a history of a kidney transplant. 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 She had a right-sided catheter for 2 months prior to her living related transplant. The patient is intolerant of statins as the Colles leg pain. She is a diabetic which is controlled very well with insulin. She says her blood sugars running in the 90s. She is medically managed for hypertension. She is a nonsmoker.  Past Medical History  Diagnosis Date  . Hypertension   . Anemia   . GERD (gastroesophageal reflux disease)   . Hypothyroidism   . Family history of adverse reaction to anesthesia     "my daughter; they thought she was asleep and she wasn't"  . Hyperlipemia   . Pneumonia "1-2 times"  . Type II diabetes mellitus   . History of blood transfusion "several"    "all related to anemia"  . Arthritis     "knees" (10/16/2014)  . Chronic kidney disease     h/o transplant  . Frequent UTI   . Carotid artery stenosis     Past Surgical History  Procedure Laterality Date  . Kidney transplant Right 2004    at [facility]  . Kidney surgery      multiple   . Thyroidectomy      1/2 removed  . Back surgery    . Tonsillectomy    . Appendectomy    . Cholecystectomy open    . Lumbar disc surgery      "L5"  . Abdominal hysterectomy    . Tubal ligation      Social History   Social History  . Marital Status: Widowed    Spouse Name: N/A  . Number of Children: 5  . Years of Education: N/A   Occupational History  . Retired    Social History Main Topics  . Smoking status: Never Smoker   . Smokeless tobacco: Never Used  . Alcohol Use: No  . Drug Use: No  . Sexual Activity: No   Other Topics Concern  . Not on file   Social History Narrative    Family History  Problem Relation Age of Onset  . Diabetes Mother   . Hypertension Mother   . Heart disease Father   . Varicose Veins Father   . Hypertension Sister   . Cancer Daughter   . Hypertension Son   . Melanoma Mother   . Hyperlipidemia Mother   . Hyperlipidemia Sister   . Hyperlipidemia Daughter   . Hyperlipidemia Son   . Hypertension Daughter     Allergies as of 01/05/2015 - Review  Complete 01/05/2015  Allergen Reaction Noted  . Codeine Nausea And Vomiting 06/16/2014  . Elavil [amitriptyline] Other (See Comments) 06/16/2014  . Iodinated diagnostic agents  06/16/2014  . Lipitor [atorvastatin]  06/16/2014  . Magnesium Hives 10/16/2014  . Metoclopramide  10/16/2014  . Morphine  10/16/2014  . Morphine and related  06/16/2014  . Neurontin [gabapentin]  06/16/2014  . Niacin and related  06/16/2014  . Norvasc [amlodipine besylate]  06/16/2014  . Talwin [pentazocine]  06/16/2014  . Ultram [tramadol]  06/16/2014    Current Outpatient Prescriptions on File Prior to Visit  Medication Sig Dispense Refill  . aspirin 81 MG tablet Take 81 mg by mouth at bedtime.     . clonazePAM (KLONOPIN) 1 MG tablet Take 1 mg by mouth at bedtime.    . ferrous gluconate (FERGON) 324 MG tablet Take 324 mg by mouth daily with breakfast.     . fluconazole (DIFLUCAN) 100 MG tablet Take 100 mg  by mouth daily.    . hyoscyamine (LEVSIN, ANASPAZ) 0.125 MG tablet Take 0.125 mg by mouth every 4 (four) hours as needed for cramping.     . insulin glargine (LANTUS) 100 UNIT/ML injection Inject 8 Units into the skin at bedtime.     Marland Kitchen levothyroxine (SYNTHROID, LEVOTHROID) 75 MCG tablet Take 75 mcg by mouth daily before breakfast.    . metoprolol (LOPRESSOR) 50 MG tablet Take 50 mg by mouth 2 (two) times daily.    . mycophenolate (CELLCEPT) 250 MG capsule Take 250 mg by mouth 2 (two) times daily.    . pantoprazole (PROTONIX) 40 MG tablet Take 40 mg by mouth daily.  5  . Probiotic Product (PROBIOTIC DAILY PO) Take 1 tablet by mouth daily.    . sodium bicarbonate 650 MG tablet Take 1 tablet (650 mg total) by mouth 2 (two) times daily. 60 tablet 0  . tacrolimus (PROGRAF) 0.5 MG capsule Take 0.5 mg by mouth 2 (two) times daily. 1 mg in the morning and 0.5 mg nightly.    . traMADol (ULTRAM) 50 MG tablet Take 50 mg by mouth every 6 (six) hours as needed for moderate pain.     . cephALEXin (KEFLEX) 500 MG capsule Take 1 capsule (500 mg total) by mouth 2 (two) times daily. Take for 5 days then stop. (Patient not taking: Reported on 01/05/2015) 10 capsule 0   No current facility-administered medications on file prior to visit.     REVIEW OF SYSTEMS: Refer to history of present illness for pertinent positives and negatives.  Otherwise no changes from prior visit  PHYSICAL EXAMINATION:   Vital signs are  Filed Vitals:   01/05/15 1125  BP: 118/68  Pulse: 63  Temp: 97.7 F (36.5 C)  TempSrc: Oral  Resp: 16  Height: _0  (1.6 m)  Weight: 100 lb 6 oz (45.53 kg)  SpO2: 100%   Body mass index is 17.79 kg/(m^2). General: The patient appears their stated age. HEENT:  No gross abnormalities Pulmonary:  Non labored breathing Abdomen: Soft and non-tender Musculoskeletal: There are no major deformities. Neurologic: No focal weakness or paresthesias are detected, Skin: There are no ulcer or rashes  noted. Psychiatric: The patient has normal affect. Cardiovascular: There is a regular rate and rhythm without significant murmur appreciated. No carotid bruit   Diagnostic Studies  I have ordered and reviewed her carotid duplex which reveals 60-79 percent right carotid stenosis and 1 -39% left carotid stenosis.  Assessment: Asymptomatic right carotid stenosis Plan:  I discussed  the treatment options with the patient.  At this level of stenosis, I would recommend medical management.  If the patient develops symptoms or if her disease progresses to greater than 80%, I would consider  Surgical endarterectomy.  I discussed the signs and symptoms of a TIA/stroke with the patient and what to do should they occur.  She will follow-up in 6 months with a repeat carotid duplex.  Eldridge Abrahams, M.D. [facility] Office: [phone] Pager:  [phone]   .vwb

## 2015-01-14 LAB — HEMOGLOBIN A1C: Hgb A1c MFr Bld: 5.2 % (ref 4.0–6.0)

## 2015-02-16 ENCOUNTER — Other Ambulatory Visit: Payer: Self-pay | Admitting: "Endocrinology

## 2015-02-16 DIAGNOSIS — I10 Essential (primary) hypertension: Secondary | ICD-10-CM

## 2015-02-16 DIAGNOSIS — E782 Mixed hyperlipidemia: Secondary | ICD-10-CM

## 2015-02-16 DIAGNOSIS — E039 Hypothyroidism, unspecified: Secondary | ICD-10-CM

## 2015-02-16 DIAGNOSIS — E1122 Type 2 diabetes mellitus with diabetic chronic kidney disease: Secondary | ICD-10-CM

## 2015-02-23 ENCOUNTER — Ambulatory Visit (INDEPENDENT_AMBULATORY_CARE_PROVIDER_SITE_OTHER): Payer: Medicare Other | Admitting: "Endocrinology

## 2015-02-23 ENCOUNTER — Encounter: Payer: Self-pay | Admitting: "Endocrinology

## 2015-02-23 VITALS — BP 131/81 | HR 70 | Ht 63.0 in | Wt 100.0 lb

## 2015-02-23 DIAGNOSIS — Z794 Long term (current) use of insulin: Secondary | ICD-10-CM | POA: Diagnosis not present

## 2015-02-23 DIAGNOSIS — E1122 Type 2 diabetes mellitus with diabetic chronic kidney disease: Secondary | ICD-10-CM | POA: Diagnosis not present

## 2015-02-23 DIAGNOSIS — E039 Hypothyroidism, unspecified: Secondary | ICD-10-CM

## 2015-02-23 DIAGNOSIS — N183 Chronic kidney disease, stage 3 unspecified: Secondary | ICD-10-CM | POA: Insufficient documentation

## 2015-02-23 DIAGNOSIS — I10 Essential (primary) hypertension: Secondary | ICD-10-CM | POA: Diagnosis not present

## 2015-02-23 NOTE — Progress Notes (Signed)
Subjective:    Patient ID: [name], female    DOB: February 12, 1941,    Past Medical History  Diagnosis Date  . Hypertension   . Anemia   . GERD (gastroesophageal reflux disease)   . Hypothyroidism   . Family history of adverse reaction to anesthesia     "my daughter; they thought she was asleep and she wasn't"  . Hyperlipemia   . Pneumonia "1-2 times"  . Type II diabetes mellitus (HCC)   . History of blood transfusion "several"    "all related to anemia"  . Arthritis     "knees" (10/16/2014)  . Chronic kidney disease     h/o transplant  . Frequent UTI   . Carotid artery stenosis    Past Surgical History  Procedure Laterality Date  . Kidney transplant Right 2004    at [facility]  . Kidney surgery      multiple  . Thyroidectomy      1/2 removed  . Back surgery    . Tonsillectomy    . Appendectomy    . Cholecystectomy open    . Lumbar disc surgery      "L5"  . Abdominal hysterectomy    . Tubal ligation     Social History   Social History  . Marital Status: Widowed    Spouse Name: N/A  . Number of Children: 5  . Years of Education: N/A   Occupational History  . Retired    Social History Main Topics  . Smoking status: Never Smoker   . Smokeless tobacco: Never Used  . Alcohol Use: No  . Drug Use: No  . Sexual Activity: No   Other Topics Concern  . None   Social History Narrative   Outpatient Encounter Prescriptions as of 02/23/2015  Medication Sig  . aspirin 81 MG tablet Take 81 mg by mouth at bedtime.   . clonazePAM (KLONOPIN) 1 MG tablet Take 1 mg by mouth at bedtime.  . fluconazole (DIFLUCAN) 100 MG tablet Take 100 mg by mouth daily.  . hyoscyamine (LEVSIN, ANASPAZ) 0.125 MG tablet Take 0.125 mg by mouth every 4 (four) hours as needed for cramping.   . insulin glargine (LANTUS) 100 UNIT/ML injection Inject into the skin at bedtime.   Marland Kitchen levothyroxine (SYNTHROID, LEVOTHROID) 75 MCG tablet Take 75 mcg by mouth daily before breakfast.  . magnesium  oxide (MAG-OX) 400 MG tablet Take 400 mg by mouth daily.  . metoprolol (LOPRESSOR) 50 MG tablet Take 50 mg by mouth 2 (two) times daily.  . mycophenolate (CELLCEPT) 250 MG capsule Take 250 mg by mouth 2 (two) times daily.  . sodium bicarbonate 650 MG tablet Take 1 tablet (650 mg total) by mouth 2 (two) times daily.  . traMADol (ULTRAM) 50 MG tablet Take 50 mg by mouth every 6 (six) hours as needed for moderate pain.   Ailene Ards ELLIPTA 62.5-25 MCG/INH AEPB   . cephALEXin (KEFLEX) 500 MG capsule Take 1 capsule (500 mg total) by mouth 2 (two) times daily. Take for 5 days then stop. (Patient not taking: Reported on 01/05/2015)  . ferrous gluconate (FERGON) 324 MG tablet Take 324 mg by mouth daily with breakfast.   . pantoprazole (PROTONIX) 40 MG tablet Take 40 mg by mouth daily.  . Probiotic Product (PROBIOTIC DAILY PO) Take 1 tablet by mouth daily.  . tacrolimus (PROGRAF) 0.5 MG capsule Take 0.5 mg by mouth 2 (two) times daily. 1 mg in the morning and 0.5 mg  nightly.   No facility-administered encounter medications on file as of 02/23/2015.   ALLERGIES: Allergies  Allergen Reactions  . Codeine Nausea And Vomiting  . Elavil [Amitriptyline] Other (See Comments)    unknown  . Iodinated Diagnostic Agents      Renal transplant pt  . Lipitor [Atorvastatin]   . Magnesium Hives    Pt tolerated oral and IV during 06/2014 admission   . Metoclopramide     Other reaction(s): Other (See Comments) "doesn't work"  . Morphine     Other reaction(s): Confusion (intolerance)  . Morphine And Related   . Neurontin [Gabapentin]   . Niacin And Related   . Norvasc [Amlodipine Besylate]   . Talwin [Pentazocine]   . Ultram [Tramadol]    VACCINATION STATUS:  There is no immunization history on file for this patient.  Diabetes She presents for her follow-up diabetic visit. She has type 2 diabetes mellitus. 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  Her disease course has been stable. There are no hypoglycemic associated symptoms. Pertinent negatives for hypoglycemia include no confusion, headaches, pallor or seizures. Associated symptoms include fatigue. Pertinent negatives for diabetes include no chest pain, no polydipsia, no polyphagia and no polyuria. There are no hypoglycemic complications. Symptoms are improving. Diabetic complications include nephropathy. Her weight is increasing steadily. There is no change in her home blood glucose trend. Her overall blood glucose range is 130-140 mg/dl.  Hypertension Pertinent negatives include no chest pain, headaches, palpitations or shortness of breath.  Hyperlipidemia Pertinent negatives include no chest pain, myalgias or shortness of breath.     Review of Systems  Constitutional: Positive for fatigue. Negative for unexpected weight change.  HENT: Negative for trouble swallowing and voice change.   Eyes: Negative for visual disturbance.  Respiratory: Negative for cough, shortness of breath and wheezing.   Cardiovascular: Negative for chest pain, palpitations and leg swelling.  Gastrointestinal: Negative for nausea, vomiting and diarrhea.  Endocrine: Negative for cold intolerance, heat intolerance, polydipsia, polyphagia and polyuria.  Musculoskeletal: Negative for myalgias and arthralgias.  Skin: Negative for color change, pallor, rash and wound.  Neurological: Negative for seizures and headaches.  Psychiatric/Behavioral: Negative for suicidal ideas and confusion.    Objective:    BP 131/81 mmHg  Pulse 70  Ht 5\' 3"  (1.6 m)  Wt 100 lb (45.36 kg)  BMI 17.72 kg/m2  SpO2 100%  Wt Readings from Last 3 Encounters:  02/23/15 100 lb (45.36 kg)  01/05/15 100 lb 6 oz (45.53 kg)  10/21/14 99 lb 10.4 oz (45.2 kg)    Physical Exam  Constitutional: She is oriented to person, place, and time. She appears well-developed.  HENT:  Head: Normocephalic and  atraumatic.  Eyes: EOM are normal.  Neck: Normal range of motion. Neck supple. No tracheal deviation present. No thyromegaly present.  Cardiovascular: Normal rate and regular rhythm.   Pulmonary/Chest: Effort normal and breath sounds normal.  Abdominal: Soft. Bowel sounds are normal. There is no tenderness. There is no guarding.  Musculoskeletal: Normal range of motion. She exhibits no edema.  Neurological: She is alert and oriented to person, place, and time. She has normal reflexes. No cranial nerve deficit. Coordination normal.  Skin: Skin is warm and dry. No rash noted. No erythema. No pallor.  Psychiatric: She has a normal mood and affect. Judgment normal.    Results for orders placed or performed in visit on  02/23/15  Hemoglobin A1c  Result Value Ref Range   Hgb A1c MFr Bld 5.2 4.0 - 6.0 %   Complete Blood Count (Most recent): Lab Results  Component Value Date   WBC 3.9* 10/21/2014   HGB 10.5* 10/21/2014   HCT 31.0* 10/21/2014   MCV 83.8 10/21/2014   PLT 172 10/21/2014   Chemistry (most recent): Lab Results  Component Value Date   NA 143 10/21/2014   K 2.8* 10/21/2014   CL 104 10/21/2014   CO2 25 10/21/2014   BUN 24* 10/21/2014   CREATININE 2.18* 10/21/2014   Diabetic Labs (most recent): Lab Results  Component Value Date   HGBA1C 5.2 01/14/2015   HGBA1C 5.9* 10/17/2014   Lipid profile (most recent): Lab Results  Component Value Date   TRIG 208* 10/17/2014   CHOL 138 10/17/2014         Assessment & Plan:   1. 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  Her   diabetes is  complicated by CKD following with Nephrology. Patient came with stable glucose profile, and  recent A1c of 5.2 %.  Glucose logs and insulin administration records pertaining to this visit,  to be  scanned into patient's records.  Recent labs reviewed. - Patient remains at a high risk for more acute and chronic complications of diabetes which include CAD, CVA, CKD, retinopathy, and neuropathy. These are all discussed in detail with the patient.  - I have re-counseled the patient on diet management and   by adopting a carbohydrate restricted / protein rich  Diet. - Patient is advised to stick to a routine mealtimes to eat 3 meals  a day and snacks as necessary to avoid hypoglycemia.  - I have approached patient with the following individualized plan to manage diabetes and patient agrees.  - I will lower her basal insulin and tests to 6 units units QHS, associated with strict monitoring of glucose QAM. -She will not need prandial insulin for now. - Patient is warned not to take insulin without proper monitoring per orders.  -Patient is encouraged to call clinic for blood glucose levels less than 70 or above 300 mg /dl. -Patient is not a candidate for metformin norSGLT2 inhibitors due to CKD.  - Patient is not a candidate for incretin therapy . - Patient specific target  for A1c; LDL, HDL, Triglycerides, and  Waist Circumference were discussed in detail.  2) BP/HTN: Controlled.   3)  Weight/Diet: She is encouraged to consume more carbs, protein and fruits and vegetables.  4) Chronic Care/Health Maintenance:  -Patient is  encouraged to continue to follow up with Ophthalmology, Podiatrist at least yearly or according to recommendations, and advised to stay away from smoking. I have recommended yearly flu vaccine and pneumonia vaccination at least every 5 years; and  sleep for at least 7 hours a day.  I advised patient to maintain close follow up with their PCP for primary care needs and her nephrologist for CKD.   Patient is asked to bring meter and  blood glucose logs during their next visit.   5) Hypothyroidism, unspecified hypothyroidism type Continue her levothyroxine at 75 g by  mouth every morning. That was in all of her levothyroxine would be based on T4 or free T4 in a state of TSH. Follow up plan:  Return in about 4 months (around 06/26/2015) for diabetes, underactive thyroid, high blood pressure, follow up with meter and logs- no labs.  Marquis Lunch, MD Phone: [phone]  Fax: [phone]   02/23/2015, 10:24 AM

## 2015-05-12 ENCOUNTER — Encounter (HOSPITAL_COMMUNITY)
Admission: RE | Admit: 2015-05-12 | Discharge: 2015-05-12 | Disposition: A | Discharge status: Home / Self Care | Payer: Medicare Other | Source: Ambulatory Visit | Attending: Nephrology | Admitting: Nephrology

## 2015-05-12 DIAGNOSIS — N183 Chronic kidney disease, stage 3 (moderate): Secondary | ICD-10-CM | POA: Insufficient documentation

## 2015-05-12 DIAGNOSIS — D638 Anemia in other chronic diseases classified elsewhere: Secondary | ICD-10-CM | POA: Diagnosis not present

## 2015-05-12 LAB — HEMOGLOBIN AND HEMATOCRIT, BLOOD
HEMATOCRIT: 28.9 % — AB (ref 36.0–46.0)
HEMOGLOBIN: 9.4 g/dL — AB (ref 12.0–15.0)

## 2015-05-12 LAB — IRON AND TIBC
Iron: 51 ug/dL (ref 28–170)
Saturation Ratios: 22 % (ref 10.4–31.8)
TIBC: 237 ug/dL — ABNORMAL LOW (ref 250–450)
UIBC: 186 ug/dL

## 2015-05-12 LAB — FERRITIN: Ferritin: 914 ng/mL — ABNORMAL HIGH (ref 11–307)

## 2015-05-12 MED ORDER — DARBEPOETIN ALFA 100 MCG/0.5ML IJ SOSY
100.0000 ug | PREFILLED_SYRINGE | INTRAMUSCULAR | Status: DC
  Administered 2015-05-12: 100 ug via SUBCUTANEOUS

## 2015-05-12 MED ORDER — DARBEPOETIN ALFA 100 MCG/0.5ML IJ SOSY
PREFILLED_SYRINGE | INTRAMUSCULAR | Status: AC
  Filled 2015-05-12: qty 0.5

## 2015-05-12 MED ORDER — SODIUM CHLORIDE 0.9 % IV SOLN
510.0000 mg | Freq: Once | INTRAVENOUS | Status: AC
  Administered 2015-05-12: 510 mg via INTRAVENOUS
  Filled 2015-05-12: qty 17

## 2015-05-12 MED ORDER — SODIUM CHLORIDE 0.9 % IV SOLN
Freq: Once | INTRAVENOUS | Status: AC
  Administered 2015-05-12: 200 mL via INTRAVENOUS

## 2015-05-13 NOTE — Progress Notes (Signed)
Results for [name], [name] (MRN [id number]) as of 05/13/2015 15:51  Ref. Range 05/12/2015 08:30  Iron Latest Ref Range: 28-170 ug/dL 51  UIBC Latest Units: ug/dL 782186  TIBC Latest Ref Range: 250-450 ug/dL 956237 (L)  Saturation Ratios Latest Ref Range: 10.4-31.8 % 22  Ferritin Latest Ref Range: 11-307 ng/mL 914 (H)  Hemoglobin Latest Ref Range: 12.0-15.0 g/dL 9.4 (L)  HCT Latest Ref Range: 36.0-46.0 % 28.9 (L)

## 2015-06-02 ENCOUNTER — Encounter (HOSPITAL_COMMUNITY)
Admission: RE | Admit: 2015-06-02 | Discharge: 2015-06-02 | Disposition: A | Discharge status: Home / Self Care | Payer: Medicare Other | Source: Ambulatory Visit | Attending: Nephrology | Admitting: Nephrology

## 2015-06-02 DIAGNOSIS — D638 Anemia in other chronic diseases classified elsewhere: Secondary | ICD-10-CM | POA: Insufficient documentation

## 2015-06-02 DIAGNOSIS — N184 Chronic kidney disease, stage 4 (severe): Secondary | ICD-10-CM | POA: Insufficient documentation

## 2015-06-02 LAB — HEMOGLOBIN AND HEMATOCRIT, BLOOD
HCT: 33.7 % — ABNORMAL LOW (ref 36.0–46.0)
Hemoglobin: 10.9 g/dL — ABNORMAL LOW (ref 12.0–15.0)

## 2015-06-02 MED ORDER — DARBEPOETIN ALFA 100 MCG/0.5ML IJ SOSY
100.0000 ug | PREFILLED_SYRINGE | INTRAMUSCULAR | Status: DC
  Administered 2015-06-02: 100 ug via SUBCUTANEOUS

## 2015-06-02 MED ORDER — DARBEPOETIN ALFA 100 MCG/0.5ML IJ SOSY
PREFILLED_SYRINGE | INTRAMUSCULAR | Status: AC
  Filled 2015-06-02: qty 0.5

## 2015-06-19 ENCOUNTER — Other Ambulatory Visit: Payer: Self-pay | Admitting: "Endocrinology

## 2015-06-23 ENCOUNTER — Encounter (HOSPITAL_COMMUNITY): Admission: RE | Admit: 2015-06-23 | Payer: Medicare Other | Source: Ambulatory Visit

## 2015-06-23 ENCOUNTER — Encounter (HOSPITAL_COMMUNITY): Payer: Medicare Other

## 2015-06-24 ENCOUNTER — Encounter (HOSPITAL_COMMUNITY)
Admission: RE | Admit: 2015-06-24 | Discharge: 2015-06-24 | Disposition: A | Discharge status: Home / Self Care | Payer: Medicare Other | Source: Ambulatory Visit | Attending: Nephrology | Admitting: Nephrology

## 2015-06-24 DIAGNOSIS — N183 Chronic kidney disease, stage 3 (moderate): Secondary | ICD-10-CM | POA: Insufficient documentation

## 2015-06-24 DIAGNOSIS — D638 Anemia in other chronic diseases classified elsewhere: Secondary | ICD-10-CM | POA: Diagnosis present

## 2015-06-24 LAB — FERRITIN: Ferritin: 891 ng/mL — ABNORMAL HIGH (ref 11–307)

## 2015-06-24 LAB — HEMOGLOBIN AND HEMATOCRIT, BLOOD
HCT: 34.1 % — ABNORMAL LOW (ref 36.0–46.0)
Hemoglobin: 10.9 g/dL — ABNORMAL LOW (ref 12.0–15.0)

## 2015-06-24 LAB — IRON AND TIBC
Iron: 62 ug/dL (ref 28–170)
SATURATION RATIOS: 26 % (ref 10.4–31.8)
TIBC: 242 ug/dL — ABNORMAL LOW (ref 250–450)
UIBC: 180 ug/dL

## 2015-06-24 MED ORDER — DARBEPOETIN ALFA 100 MCG/0.5ML IJ SOSY
PREFILLED_SYRINGE | INTRAMUSCULAR | Status: AC
  Filled 2015-06-24: qty 0.5

## 2015-06-24 MED ORDER — DARBEPOETIN ALFA 100 MCG/0.5ML IJ SOSY
100.0000 ug | PREFILLED_SYRINGE | INTRAMUSCULAR | Status: DC
  Administered 2015-06-24: 100 ug via SUBCUTANEOUS

## 2015-06-25 NOTE — Progress Notes (Signed)
Results for FARRELL, BROERMAN (MRN [id number]) as of 06/25/2015 07:45  Labs from 06/24/2015   Ref. Range 06/24/2015 11:50  Iron Latest Ref Range: 28-170 ug/dL 62  UIBC Latest Units: ug/dL 409  TIBC Latest Ref Range: 250-450 ug/dL 811 (L)  Saturation Ratios Latest Ref Range: 10.4-31.8 % 26  Ferritin Latest Ref Range: 11-307 ng/mL 891 (H)  Hemoglobin Latest Ref Range: 12.0-15.0 g/dL 91.4 (L)  HCT Latest Ref Range: 36.0-46.0 % 34.1 (L)

## 2015-06-26 ENCOUNTER — Encounter: Payer: Self-pay | Admitting: "Endocrinology

## 2015-06-26 ENCOUNTER — Ambulatory Visit (INDEPENDENT_AMBULATORY_CARE_PROVIDER_SITE_OTHER): Payer: Medicare Other | Admitting: "Endocrinology

## 2015-06-26 VITALS — BP 128/75 | HR 78 | Ht 63.0 in | Wt 99.0 lb

## 2015-06-26 DIAGNOSIS — N183 Chronic kidney disease, stage 3 (moderate): Secondary | ICD-10-CM

## 2015-06-26 DIAGNOSIS — Z794 Long term (current) use of insulin: Secondary | ICD-10-CM

## 2015-06-26 DIAGNOSIS — E1122 Type 2 diabetes mellitus with diabetic chronic kidney disease: Secondary | ICD-10-CM | POA: Diagnosis not present

## 2015-06-26 DIAGNOSIS — E039 Hypothyroidism, unspecified: Secondary | ICD-10-CM

## 2015-06-26 DIAGNOSIS — I10 Essential (primary) hypertension: Secondary | ICD-10-CM | POA: Diagnosis not present

## 2015-06-26 NOTE — Progress Notes (Signed)
Subjective:    Patient ID: [name], female    DOB: 08/18/1940,    Past Medical History  Diagnosis Date  . Hypertension   . Anemia   . GERD (gastroesophageal reflux disease)   . Hypothyroidism   . Family history of adverse reaction to anesthesia     "my daughter; they thought she was asleep and she wasn't"  . Hyperlipemia   . Pneumonia "1-2 times"  . Type II diabetes mellitus ([place])   . History of blood transfusion "several"    "all related to anemia"  . Arthritis     "knees" (10/16/2014)  . Chronic kidney disease     h/o transplant  . Frequent UTI   . Carotid artery stenosis    Past Surgical History  Procedure Laterality Date  . Kidney transplant Right 2004    at [facility]  . Kidney surgery      multiple  . Thyroidectomy      1/2 removed  . Back surgery    . Tonsillectomy    . Appendectomy    . Cholecystectomy open    . Lumbar disc surgery      "L5"  . Abdominal hysterectomy    . Tubal ligation     Social History   Social History  . Marital Status: Widowed    Spouse Name: N/A  . Number of Children: 5  . Years of Education: N/A   Occupational History  . Retired    Social History Main Topics  . Smoking status: Never Smoker   . Smokeless tobacco: Never Used  . Alcohol Use: No  . Drug Use: No  . Sexual Activity: No   Other Topics Concern  . None   Social History Narrative   Outpatient Encounter Prescriptions as of 06/26/2015  Medication Sig  . dicyclomine (BENTYL) 10 MG capsule Take 10 mg by mouth 4 (four) times daily -  before meals and at bedtime.  . Insulin Glargine (LANTUS SOLOSTAR) 100 UNIT/ML Solostar Pen Inject 6 Units into the skin at bedtime.  . Pancrelipase, Lip-Prot-Amyl, (ZENPEP) 5000 units CPEP Take by mouth.  [name]   . aspirin 81 MG tablet Take 81 mg by mouth at bedtime.   . Blood Glucose Monitoring Suppl (ACCU-CHEK AVIVA PLUS) w/Device KIT Use as directed 2 x daily  . clonazePAM (KLONOPIN) 1 MG  tablet Take 1 mg by mouth at bedtime.  . ferrous gluconate (FERGON) 324 MG tablet Take 324 mg by mouth daily with breakfast.   . fluconazole (DIFLUCAN) 100 MG tablet Take 100 mg by mouth daily.  . hyoscyamine (LEVSIN, ANASPAZ) 0.125 MG tablet Take 0.125 mg by mouth every 4 (four) hours as needed for cramping.   Marland Kitchen levothyroxine (SYNTHROID, LEVOTHROID) 75 MCG tablet Take 75 mcg by mouth daily before breakfast.  . magnesium oxide (MAG-OX) 400 MG tablet Take 400 mg by mouth daily.  . metoprolol (LOPRESSOR) 50 MG tablet Take 50 mg by mouth 2 (two) times daily.  . mycophenolate (CELLCEPT) 250 MG capsule Take 250 mg by mouth 2 (two) times daily.  . pantoprazole (PROTONIX) 40 MG tablet Take 40 mg by mouth daily.  . Probiotic Product (PROBIOTIC DAILY PO) Take 1 tablet by mouth daily.  . sodium bicarbonate 650 MG tablet Take 1 tablet (650 mg total) by mouth 2 (two) times daily.  . tacrolimus (PROGRAF) 0.5 MG capsule Take 0.5 mg by mouth 2 (two) times daily. 1 mg in the morning and 0.5  mg nightly.  . traMADol (ULTRAM) 50 MG tablet Take 50 mg by mouth every 6 (six) hours as needed for moderate pain.   . [DISCONTINUED] cephALEXin (KEFLEX) 500 MG capsule Take 1 capsule (500 mg total) by mouth 2 (two) times daily. Take for 5 days then stop. (Patient not taking: Reported on 01/05/2015)  . [DISCONTINUED] insulin glargine (LANTUS) 100 UNIT/ML injection Inject into the skin at bedtime.    No facility-administered encounter medications on file as of 06/26/2015.   ALLERGIES: Allergies  Allergen Reactions  . Codeine Nausea And Vomiting  . Elavil [Amitriptyline] Other (See Comments)    unknown  . Iodinated Diagnostic Agents      Renal transplant pt  . Lipitor [Atorvastatin]   . Magnesium Hives    Pt tolerated oral and IV during 06/2014 admission   . Metoclopramide     Other reaction(s): Other (See Comments) "doesn't work"  . Morphine     Other reaction(s): Confusion (intolerance)  . Morphine And Related    . Neurontin [Gabapentin]   . Niacin And Related   . Norvasc [Amlodipine Besylate]   . Talwin [Pentazocine]   . Ultram [Tramadol]    VACCINATION STATUS:  There is no immunization history on file for this patient.  Diabetes She presents for her follow-up diabetic visit. She has type 2 diabetes mellitus. 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  Her disease course has been stable. There are no hypoglycemic associated symptoms. Pertinent negatives for hypoglycemia include no confusion, headaches, pallor or seizures. Associated symptoms include fatigue. Pertinent negatives for diabetes include no chest pain, no polydipsia, no polyphagia and no polyuria. There are no hypoglycemic complications. Symptoms are improving. Diabetic complications include nephropathy. Her weight is stable (She is initiated on Creon since last visit for probable malabsorption.). She never participates in exercise. There is no change in her home blood glucose trend. Her breakfast blood glucose range is generally 90-110 mg/dl. Her overall blood glucose range is 90-110 mg/dl. An ACE inhibitor/angiotensin II receptor blocker is not being taken.  Hypertension Pertinent negatives include no chest pain, headaches, palpitations or shortness of breath. Hypertensive end-organ damage includes a thyroid problem.  Hyperlipidemia Pertinent negatives include no chest pain, myalgias or shortness of breath.  Thyroid Problem Presents for follow-up visit. Symptoms include fatigue. Patient reports no cold intolerance, diarrhea, heat intolerance or palpitations. The symptoms have been stable. Past treatments include levothyroxine. Her past medical history is significant for hyperlipidemia.     Review of Systems  Constitutional: Positive for fatigue. Negative for unexpected weight change.  HENT: Negative for trouble swallowing and voice change.    Eyes: Negative for visual disturbance.  Respiratory: Negative for cough, shortness of breath and wheezing.   Cardiovascular: Negative for chest pain, palpitations and leg swelling.  Gastrointestinal: Negative for nausea, vomiting and diarrhea.  Endocrine: Negative for cold intolerance, heat intolerance, polydipsia, polyphagia and polyuria.  Musculoskeletal: Negative for myalgias and arthralgias.  Skin: Negative for color change, pallor, rash and wound.  Neurological: Negative for seizures and headaches.  Psychiatric/Behavioral: Negative for suicidal ideas and confusion.    Objective:    BP 128/75 mmHg  Pulse 78  Ht 5' 3"  (1.6 m)  Wt 99 lb (44.906 kg)  BMI 17.54 kg/m2  SpO2 100%  Wt Readings from Last 3 Encounters:  06/26/15 99 lb (44.906 kg)  02/23/15 100 lb (45.36 kg)  01/05/15 100 lb 6 oz (  45.53 kg)    Physical Exam  Constitutional: She is oriented to person, place, and time. She appears well-developed.  HENT:  Head: Normocephalic and atraumatic.  Eyes: EOM are normal.  Neck: Normal range of motion. Neck supple. No tracheal deviation present. No thyromegaly present.  Cardiovascular: Normal rate and regular rhythm.   Pulmonary/Chest: Effort normal and breath sounds normal.  Abdominal: Soft. Bowel sounds are normal. There is no tenderness. There is no guarding.  Musculoskeletal: Normal range of motion. She exhibits no edema.  Neurological: She is alert and oriented to person, place, and time. She has normal reflexes. No cranial nerve deficit. Coordination normal.  Skin: Skin is warm and dry. No rash noted. No erythema. No pallor.  Psychiatric: She has a normal mood and affect. Judgment normal.    Results for orders placed or performed during the hospital encounter of 06/24/15  Ferritin  Result Value Ref Range   Ferritin 891 (H) 11 - 307 ng/mL  Hemoglobin and hematocrit, blood  Result Value Ref Range   Hemoglobin 10.9 (L) 12.0 - 15.0 g/dL   HCT 34.1 (L) 36.0 - 46.0 %   Iron and TIBC  Result Value Ref Range   Iron 62 28 - 170 ug/dL   TIBC 242 (L) 250 - 450 ug/dL   Saturation Ratios 26 10.4 - 31.8 %   UIBC 180 ug/dL   Diabetic Labs (most recent): Lab Results  Component Value Date   HGBA1C 5.2 01/14/2015   HGBA1C 5.9* 10/17/2014   Lipid profile (most recent): Lab Results  Component Value Date   TRIG 208* 10/17/2014   CHOL 138 10/17/2014         Assessment & Plan:   1. 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  Her   diabetes is  complicated by CKD following with Nephrology. Patient came with stable glucose profile, and  recent A1c of 5.2 %.  Glucose logs and insulin administration records pertaining to this visit,  to be scanned into patient's records.  Recent labs reviewed. - Patient remains at a high risk for more acute and chronic complications of diabetes which include CAD, CVA, CKD, retinopathy, and neuropathy. These are all discussed in detail with the patient.  - I have re-counseled the patient on diet management and   by adopting a carbohydrate restricted / protein rich  Diet. - Patient is advised to stick to a routine mealtimes to eat 3 meals  a day and snacks as necessary to avoid hypoglycemia.  - I have approached patient with the following individualized plan to manage diabetes and patient agrees.  - I will lower her basal insulin and tests to 6 units units every other night , associated with strict monitoring of glucose QAM. -She will not need prandial insulin for now. -Patient is encouraged to call clinic for blood glucose levels less than 70 or above 200 mg /dl. -Patient is not a candidate for metformin norSGLT2 inhibitors due to CKD. -She is now on Creon for probable malabsorption, I agree with plan and patient may benefit. I advised  her to take to caps with meals and one cap with snacks. - Patient is not a candidate for incretin therapy . - Patient specific target  for A1c; LDL, HDL, Triglycerides, and  Waist Circumference were discussed in detail.  2)  BP/HTN: Controlled.   3)  Weight/Diet: She is encouraged to consume more carbs, protein and fruits and vegetables.  4) Chronic Care/Health Maintenance:  -Patient is  encouraged to continue to follow up with Ophthalmology, Podiatrist at least yearly or according to recommendations, and advised to stay away from smoking. I have recommended yearly flu vaccine and pneumonia vaccination at least every 5 years; and  sleep for at least 7 hours a day.  I advised patient to maintain close follow up with their PCP for primary care needs and her nephrologist for CKD.   Patient is asked to bring meter and  blood glucose logs during their next visit.   5) Hypothyroidism, unspecified hypothyroidism type  Continue her levothyroxine at 75 g by mouth every morning. Decision to adjust her levothyroxine would be based on T4 or free T4 in stead of TSH.   - We discussed about correct intake of levothyroxine, at fasting, with water, separated by at least 30 minutes from breakfast, and separated by more than 4 hours from calcium, iron, multivitamins, acid reflux medications (PPIs). -Patient is made aware of the fact that thyroid hormone replacement is needed for life, dose to be adjusted by periodic monitoring of thyroid function tests.  Follow up plan: Return in about 3 months (around 09/23/2015) for diabetes, high blood pressure, high cholesterol, underactive thyroid.  Glade Lloyd, MD Phone: [phone]  Fax: [phone]   06/26/2015, 11:19 AM

## 2015-06-29 ENCOUNTER — Other Ambulatory Visit: Payer: Self-pay

## 2015-06-29 MED ORDER — GLUCOSE BLOOD VI STRP: ORAL_STRIP | Status: AC

## 2015-06-29 MED ORDER — GLUCOSE BLOOD VI STRP: ORAL_STRIP | Status: DC

## 2015-07-03 ENCOUNTER — Encounter: Payer: Self-pay | Admitting: Family

## 2015-07-13 ENCOUNTER — Ambulatory Visit (HOSPITAL_COMMUNITY)
Admission: RE | Admit: 2015-07-13 | Discharge: 2015-07-13 | Disposition: A | Discharge status: Home / Self Care | Payer: Medicare Other | Source: Ambulatory Visit | Attending: Family | Admitting: Family

## 2015-07-13 ENCOUNTER — Encounter: Payer: Self-pay | Admitting: Family

## 2015-07-13 ENCOUNTER — Ambulatory Visit (INDEPENDENT_AMBULATORY_CARE_PROVIDER_SITE_OTHER): Payer: Medicare Other | Admitting: Family

## 2015-07-13 VITALS — BP 118/69 | HR 63 | Ht 63.0 in | Wt 98.8 lb

## 2015-07-13 DIAGNOSIS — E1122 Type 2 diabetes mellitus with diabetic chronic kidney disease: Secondary | ICD-10-CM | POA: Insufficient documentation

## 2015-07-13 DIAGNOSIS — I6521 Occlusion and stenosis of right carotid artery: Secondary | ICD-10-CM | POA: Diagnosis not present

## 2015-07-13 DIAGNOSIS — N189 Chronic kidney disease, unspecified: Secondary | ICD-10-CM | POA: Diagnosis not present

## 2015-07-13 DIAGNOSIS — Z940 Kidney transplant status: Secondary | ICD-10-CM | POA: Insufficient documentation

## 2015-07-13 DIAGNOSIS — I6523 Occlusion and stenosis of bilateral carotid arteries: Secondary | ICD-10-CM | POA: Diagnosis not present

## 2015-07-13 DIAGNOSIS — I129 Hypertensive chronic kidney disease with stage 1 through stage 4 chronic kidney disease, or unspecified chronic kidney disease: Secondary | ICD-10-CM | POA: Diagnosis not present

## 2015-07-13 DIAGNOSIS — E785 Hyperlipidemia, unspecified: Secondary | ICD-10-CM | POA: Insufficient documentation

## 2015-07-13 NOTE — Progress Notes (Signed)
Chief Complaint: Extracranial Carotid Artery Stenosis   History of Present Illness  [name] is a 75 y.o. female who returns today for follow-up. Dr. Trula Slade initially met her in February 2016 for evaluation of carotid artery stenosis. She had a carotid ultrasound performed at another facility that revealed greater than 70% right carotid artery stenosis and less than 50% left carotid stenosis. Ultrasound in our office identifies 60-79 percent right carotid artery stenosis. Medical management was recommended given that she was asymptomatic.   She was hospitalized for a kidney infection in 2016. She also suffered aspiration pneumonia which led to intubation. She has recovered from this. She also lost a significant amount of weight and is trying to gain that back.  The patient has a history of a kidney transplant. 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 She had a right-sided catheter for 2 months prior to her living related transplant. The patient is intolerant of statins as these cause leg pain. She is a diabetic which is controlled very well with insulin. She says her blood sugars running in the 90s. She is medically managed for hypertension. She is a nonsmoker.  Patient has not had previous carotid artery intervention.  The patient denies any history of TIA or stroke symptoms, specifically the patient denies a history of amaurosis fugax or monocular blindness, denies a history unilateral  of facial drooping, denies a history of hemiplegia, and denies a history of receptive or expressive aphasia.    Pt denies any claudication sx's with walking.   The patient reports New Medical or Surgical History: she had iron deficiency anemia, received iron infusions on 3 separate occasions, the last was January 2017, is also receiving Aranesp.   Pt Diabetic: August 2016 A1C was 5.2 (review of records); she states she did not have DM until after the transplant.  Pt smoker:  non-smoker  Pt meds include: Statin : no ASA: yes Other anticoagulants/antiplatelets: no   Past Medical History  Diagnosis Date  . Hypertension   . Anemia   . GERD (gastroesophageal reflux disease)   . Hypothyroidism   . Family history of adverse reaction to anesthesia     "my daughter; they thought she was asleep and she wasn't"  . Hyperlipemia   . Pneumonia "1-2 times"  . Type II diabetes mellitus ([place])   . History of blood transfusion "several"    "all related to anemia"  . Arthritis     "knees" (10/16/2014)  . Chronic kidney disease     h/o transplant  . Frequent UTI   . Carotid artery stenosis     Social History Social History  Substance Use Topics  . Smoking status: Never Smoker   . Smokeless tobacco: Never Used  . Alcohol Use: No    Family History Family History  Problem Relation Age of Onset  . Diabetes Mother   . Hypertension Mother   . Heart disease Father   . Varicose Veins Father   . Hypertension Sister   . Cancer Daughter   . Hypertension Son   . Melanoma Mother   . Hyperlipidemia Mother   . Hyperlipidemia Sister   . Hyperlipidemia Daughter   . Hyperlipidemia Son   . Hypertension Daughter     Surgical History Past Surgical History  Procedure Laterality Date  . Kidney transplant Right 2004    at [facility]  . Kidney surgery      multiple  . Thyroidectomy      1/2 removed  . Back  surgery    . Tonsillectomy    . Appendectomy    . Cholecystectomy open    . Lumbar disc surgery      "L5"  . Abdominal hysterectomy    . Tubal ligation      Allergies  Allergen Reactions  . Codeine Nausea And Vomiting  . Elavil [Amitriptyline] Other (See Comments)    unknown  . Iodinated Diagnostic Agents      Renal transplant pt  . Lipitor [Atorvastatin]   . Magnesium Hives    Pt tolerated oral and IV during 06/2014 admission   . Metoclopramide     Other reaction(s): Other (See Comments) "doesn't work"  . Morphine     Other reaction(s): Confusion  (intolerance)  . Morphine And Related   . Neurontin [Gabapentin]   . Niacin And Related   . Norvasc [Amlodipine Besylate]   . Talwin [Pentazocine]   . Ultram [Tramadol]     Current Outpatient Prescriptions  Medication Sig Dispense Refill  . ANORO ELLIPTA 62.5-25 MCG/INH AEPB   5  . aspirin 81 MG tablet Take 81 mg by mouth at bedtime.     . Blood Glucose Monitoring Suppl (ACCU-CHEK AVIVA PLUS) w/Device KIT Use as directed 2 x daily 1 kit 0  . clonazePAM (KLONOPIN) 1 MG tablet Take 1 mg by mouth at bedtime.    . dicyclomine (BENTYL) 10 MG capsule Take 10 mg by mouth 4 (four) times daily -  before meals and at bedtime.    . ferrous gluconate (FERGON) 324 MG tablet Take 324 mg by mouth daily with breakfast.     . fluconazole (DIFLUCAN) 100 MG tablet Take 100 mg by mouth daily.    Marland Kitchen glucose blood (ACCU-CHEK AVIVA) test strip Use as instructed bid 100 each 5  . hyoscyamine (LEVSIN, ANASPAZ) 0.125 MG tablet Take 0.125 mg by mouth every 4 (four) hours as needed for cramping.     . Insulin Glargine (LANTUS SOLOSTAR) 100 UNIT/ML Solostar Pen Inject 6 Units into the skin at bedtime.    Marland Kitchen levothyroxine (SYNTHROID, LEVOTHROID) 75 MCG tablet Take 75 mcg by mouth daily before breakfast.    . magnesium oxide (MAG-OX) 400 MG tablet Take 400 mg by mouth daily.    . metoprolol (LOPRESSOR) 50 MG tablet Take 50 mg by mouth 2 (two) times daily.    . mycophenolate (CELLCEPT) 250 MG capsule Take 250 mg by mouth 2 (two) times daily.    . Pancrelipase, Lip-Prot-Amyl, (ZENPEP) 5000 units CPEP Take by mouth.    . pantoprazole (PROTONIX) 40 MG tablet Take 40 mg by mouth daily.  5  . Probiotic Product (PROBIOTIC DAILY PO) Take 1 tablet by mouth daily.    . sodium bicarbonate 650 MG tablet Take 1 tablet (650 mg total) by mouth 2 (two) times daily. 60 tablet 0  . tacrolimus (PROGRAF) 0.5 MG capsule Take 0.5 mg by mouth 2 (two) times daily. 1 mg in the morning and 0.5 mg nightly.    . traMADol (ULTRAM) 50 MG tablet  Take 50 mg by mouth every 6 (six) hours as needed for moderate pain.      No current facility-administered medications for this visit.    Review of Systems : See HPI for pertinent positives and negatives.  Physical Examination  Filed Vitals:   07/13/15 1429 07/13/15 1431  BP: 120/66 118/69  Pulse: 63   Height: 5' 3"  (1.6 m)   Weight: 98 lb 12.8 oz (44.815 kg)   SpO2: 99%  Body mass index is 17.51 kg/(m^2).  General: WDWN thin female in NAD GAIT: normal Eyes: PERRLA Pulmonary:  Non-labored, CTAB  Cardiac: regular rhythm, no detected murmur.  VASCULAR EXAM Carotid Bruits Right Left   Positive negative    Aorta is not palpable. Radial pulses are 2+ palpable and equal.                                                                                                                            LE Pulses Right Left       POPLITEAL  not palpable   not palpable       POSTERIOR TIBIAL   palpable   not palpable        DORSALIS PEDIS      ANTERIOR TIBIAL  palpable   palpable     Gastrointestinal: soft, nontender, BS WNL, no r/g,  no palpable masses.  Musculoskeletal: no muscle atrophy/wasting. M/S 5/5 throughout, extremities without ischemic changes.  Neurologic: A&O X 3; Appropriate Affect, Speech is normal CN 2-12 intact, pain and light touch intact in extremities, Motor exam as listed above.   Non-Invasive Vascular Imaging CAROTID DUPLEX 07/13/2015   Right ICA: 60 - 79 % stenosis. Left ICA: no stenosis. No significant change compared to 01/05/15 exam.   Assessment: [name] is a 75 y.o. female who has no history of stroke or TIA. Today's carotid duplex suggests 60-79% right ICA stenosis and no stenosis of the left ICA.  No significant change compared to 01/05/15 exam.    Plan: Follow-up in 6 months with Carotid Duplex scan.   I discussed in depth with the patient the nature of atherosclerosis, and emphasized the importance of maximal medical management  including strict control of blood pressure, blood glucose, and lipid levels, obtaining regular exercise, and continued cessation of smoking.  The patient is aware that without maximal medical management the underlying atherosclerotic disease process will progress, limiting the benefit of any interventions. The patient was given information about stroke prevention and what symptoms should prompt the patient to seek immediate medical care. Thank you for allowing [place] to participate in this patient's care.  Clemon Chambers, RN, MSN, FNP-C [facility] Office: [phone]  Clinic Physician: Trula Slade  07/13/2015 2:08 PM

## 2015-07-13 NOTE — Patient Instructions (Signed)
Stroke Prevention Some medical conditions and behaviors are associated with an increased chance of having a stroke. You may prevent a stroke by making healthy choices and managing medical conditions. HOW CAN I REDUCE MY RISK OF HAVING A STROKE?   Stay physically active. Get at least 30 minutes of activity on most or all days.  Do not smoke. It may also be helpful to avoid exposure to secondhand smoke.  Limit alcohol use. Moderate alcohol use is considered to be:  No more than 2 drinks per day for men.  No more than 1 drink per day for nonpregnant women.  Eat healthy foods. This involves:  Eating 5 or more servings of fruits and vegetables a day.  Making dietary changes that address high blood pressure (hypertension), high cholesterol, diabetes, or obesity.  Manage your cholesterol levels.  Making food choices that are high in fiber and low in saturated fat, trans fat, and cholesterol may control cholesterol levels.  Take any prescribed medicines to control cholesterol as directed by your health care provider.  Manage your diabetes.  Controlling your carbohydrate and sugar intake is recommended to manage diabetes.  Take any prescribed medicines to control diabetes as directed by your health care provider.  Control your hypertension.  Making food choices that are low in salt (sodium), saturated fat, trans fat, and cholesterol is recommended to manage hypertension.  Ask your health care provider if you need treatment to lower your blood pressure. Take any prescribed medicines to control hypertension as directed by your health care provider.  If you are 18-39 years of age, have your blood pressure checked every 3-5 years. If you are 40 years of age or older, have your blood pressure checked every year.  Maintain a healthy weight.  Reducing calorie intake and making food choices that are low in sodium, saturated fat, trans fat, and cholesterol are recommended to manage  weight.  Stop drug abuse.  Avoid taking birth control pills.  Talk to your health care provider about the risks of taking birth control pills if you are over 35 years old, smoke, get migraines, or have ever had a blood clot.  Get evaluated for sleep disorders (sleep apnea).  Talk to your health care provider about getting a sleep evaluation if you snore a lot or have excessive sleepiness.  Take medicines only as directed by your health care provider.  For some people, aspirin or blood thinners (anticoagulants) are helpful in reducing the risk of forming abnormal blood clots that can lead to stroke. If you have the irregular heart rhythm of atrial fibrillation, you should be on a blood thinner unless there is a good reason you cannot take them.  Understand all your medicine instructions.  Make sure that other conditions (such as anemia or atherosclerosis) are addressed. SEEK IMMEDIATE MEDICAL CARE IF:   You have sudden weakness or numbness of the face, arm, or leg, especially on one side of the body.  Your face or eyelid droops to one side.  You have sudden confusion.  You have trouble speaking (aphasia) or understanding.  You have sudden trouble seeing in one or both eyes.  You have sudden trouble walking.  You have dizziness.  You have a loss of balance or coordination.  You have a sudden, severe headache with no known cause.  You have new chest pain or an irregular heartbeat. Any of these symptoms may represent a serious problem that is an emergency. Do not wait to see if the symptoms will   go away. Get medical help at once. Call your local emergency services (911 in [place] Do not drive yourself to the hospital.   This information is not intended to replace advice given to you by your health care provider. Make sure you discuss any questions you have with your health care provider.   Document Released: 06/09/2004 Document Revised: 05/23/2014 Document Reviewed:  11/02/2012 Elsevier Interactive Patient Education 2016 [employer]vier Inc.  

## 2015-07-15 ENCOUNTER — Encounter (HOSPITAL_COMMUNITY): Payer: Medicare Other

## 2015-07-15 ENCOUNTER — Encounter (HOSPITAL_COMMUNITY): Admission: RE | Admit: 2015-07-15 | Payer: Medicare Other | Source: Ambulatory Visit

## 2015-07-20 ENCOUNTER — Encounter (HOSPITAL_COMMUNITY): Payer: Self-pay

## 2015-07-20 ENCOUNTER — Encounter (HOSPITAL_COMMUNITY)
Admission: RE | Admit: 2015-07-20 | Discharge: 2015-07-20 | Disposition: A | Discharge status: Home / Self Care | Payer: Medicare Other | Source: Ambulatory Visit | Attending: Nephrology | Admitting: Nephrology

## 2015-07-20 DIAGNOSIS — D638 Anemia in other chronic diseases classified elsewhere: Secondary | ICD-10-CM | POA: Diagnosis not present

## 2015-07-20 DIAGNOSIS — N184 Chronic kidney disease, stage 4 (severe): Secondary | ICD-10-CM | POA: Insufficient documentation

## 2015-07-20 LAB — RENAL FUNCTION PANEL
ALBUMIN: 4.3 g/dL (ref 3.5–5.0)
Anion gap: 6 (ref 5–15)
BUN: 44 mg/dL — AB (ref 6–20)
CHLORIDE: 109 mmol/L (ref 101–111)
CO2: 23 mmol/L (ref 22–32)
Calcium: 8.6 mg/dL — ABNORMAL LOW (ref 8.9–10.3)
Creatinine, Ser: 2.1 mg/dL — ABNORMAL HIGH (ref 0.44–1.00)
GFR, EST AFRICAN AMERICAN: 26 mL/min — AB (ref >60)
GFR, EST NON AFRICAN AMERICAN: 22 mL/min — AB (ref >60)
Glucose, Bld: 103 mg/dL — ABNORMAL HIGH (ref 65–99)
PHOSPHORUS: 3.8 mg/dL (ref 2.5–4.6)
POTASSIUM: 5.1 mmol/L (ref 3.5–5.1)
Sodium: 138 mmol/L (ref 135–145)

## 2015-07-20 LAB — IRON AND TIBC
IRON: 88 ug/dL (ref 28–170)
Saturation Ratios: 39 % — ABNORMAL HIGH (ref 10.4–31.8)
TIBC: 225 ug/dL — AB (ref 250–450)
UIBC: 137 ug/dL

## 2015-07-20 LAB — HEMOGLOBIN AND HEMATOCRIT, BLOOD
HEMATOCRIT: 35.6 % — AB (ref 36.0–46.0)
HEMOGLOBIN: 11.6 g/dL — AB (ref 12.0–15.0)

## 2015-07-20 LAB — FERRITIN: Ferritin: 937 ng/mL — ABNORMAL HIGH (ref 11–307)

## 2015-07-20 MED ORDER — DARBEPOETIN ALFA 100 MCG/0.5ML IJ SOSY
100.0000 ug | PREFILLED_SYRINGE | INTRAMUSCULAR | Status: DC
  Administered 2015-07-20: 100 ug via SUBCUTANEOUS
  Filled 2015-07-20: qty 0.5

## 2015-07-21 NOTE — Progress Notes (Signed)
Results for MAREESA, GATHRIGHT (MRN [id number]) as of 07/21/2015 15:00  Ref. Range 07/20/2015 10:30  Sodium Latest Ref Range: 135-145 mmol/L 138  Potassium Latest Ref Range: 3.5-5.1 mmol/L 5.1  Chloride Latest Ref Range: 101-111 mmol/L 109  CO2 Latest Ref Range: 22-32 mmol/L 23  BUN Latest Ref Range: 6-20 mg/dL 44 (H)  Creatinine Latest Ref Range: 0.44-1.00 mg/dL 2.10 (H)  Calcium Latest Ref Range: 8.9-10.3 mg/dL 8.6 (L)  EGFR (Non-African Amer.) Latest Ref Range: >60 mL/min 22 (L)  EGFR (African American) Latest Ref Range: >60 mL/min 26 (L)  Glucose Latest Ref Range: 65-99 mg/dL 103 (H)  Anion gap Latest Ref Range: 5-15  6  Phosphorus Latest Ref Range: 2.5-4.6 mg/dL 3.8  Albumin Latest Ref Range: 3.5-5.0 g/dL 4.3  Iron Latest Ref Range: 28-170 ug/dL 88  UIBC Latest Units: ug/dL 137  TIBC Latest Ref Range: 250-450 ug/dL 225 (L)  Saturation Ratios Latest Ref Range: 10.4-31.8 % 39 (H)  Ferritin Latest Ref Range: 11-307 ng/mL 937 (H)  Hemoglobin Latest Ref Range: 12.0-15.0 g/dL 11.6 (L)  HCT Latest Ref Range: 36.0-46.0 % 35.6 (L)   Aranesp 100 mcg administered

## 2015-08-10 ENCOUNTER — Encounter (HOSPITAL_COMMUNITY)
Admission: RE | Admit: 2015-08-10 | Discharge: 2015-08-10 | Disposition: A | Discharge status: Home / Self Care | Payer: Medicare Other | Source: Ambulatory Visit | Attending: Nephrology | Admitting: Nephrology

## 2015-08-10 DIAGNOSIS — N184 Chronic kidney disease, stage 4 (severe): Secondary | ICD-10-CM | POA: Diagnosis not present

## 2015-08-10 LAB — HEMOGLOBIN AND HEMATOCRIT, BLOOD
HCT: 35.6 % — ABNORMAL LOW (ref 36.0–46.0)
Hemoglobin: 11.5 g/dL — ABNORMAL LOW (ref 12.0–15.0)

## 2015-08-10 MED ORDER — DARBEPOETIN ALFA 100 MCG/0.5ML IJ SOSY
100.0000 ug | PREFILLED_SYRINGE | INTRAMUSCULAR | Status: DC
  Administered 2015-08-10: 100 ug via SUBCUTANEOUS

## 2015-08-10 MED ORDER — DARBEPOETIN ALFA 100 MCG/0.5ML IJ SOSY
PREFILLED_SYRINGE | INTRAMUSCULAR | Status: AC
  Filled 2015-08-10: qty 0.5

## 2015-08-31 ENCOUNTER — Encounter (HOSPITAL_COMMUNITY)
Admission: RE | Admit: 2015-08-31 | Discharge: 2015-08-31 | Disposition: A | Discharge status: Home / Self Care | Payer: Medicare Other | Source: Ambulatory Visit | Attending: Nephrology | Admitting: Nephrology

## 2015-08-31 ENCOUNTER — Encounter (HOSPITAL_COMMUNITY): Payer: Self-pay

## 2015-08-31 DIAGNOSIS — N183 Chronic kidney disease, stage 3 (moderate): Secondary | ICD-10-CM | POA: Insufficient documentation

## 2015-08-31 DIAGNOSIS — D638 Anemia in other chronic diseases classified elsewhere: Secondary | ICD-10-CM | POA: Insufficient documentation

## 2015-08-31 LAB — HEMOGLOBIN AND HEMATOCRIT, BLOOD
HCT: 38.5 % (ref 36.0–46.0)
HEMOGLOBIN: 12.6 g/dL (ref 12.0–15.0)

## 2015-08-31 LAB — FERRITIN: Ferritin: 1018 ng/mL — ABNORMAL HIGH (ref 11–307)

## 2015-08-31 LAB — IRON AND TIBC
IRON: 76 ug/dL (ref 28–170)
Saturation Ratios: 30 % (ref 10.4–31.8)
TIBC: 252 ug/dL (ref 250–450)
UIBC: 176 ug/dL

## 2015-08-31 MED ORDER — DARBEPOETIN ALFA 100 MCG/0.5ML IJ SOSY: 100.0000 ug | PREFILLED_SYRINGE | INTRAMUSCULAR | Status: DC

## 2015-09-01 NOTE — Progress Notes (Signed)
Results for [name], [name] (MRN [id number]) as of 09/01/2015 15:07  Ref. Range 08/31/2015 10:01 08/31/2015 10:02  Iron Latest Ref Range: 28-170 ug/dL  76  UIBC Latest Units: ug/dL  [id number]  TIBC Latest Ref Range: 250-450 ug/dL  [id number]  Saturation Ratios Latest Ref Range: 10.4-31.8 %  30  Ferritin Latest Ref Range: 11-307 ng/mL  1018 (H)  Hemoglobin Latest Ref Range: 12.0-15.0 g/dL 91.412.6   HCT Latest Ref Range: 36.0-46.0 % 38.5

## 2015-09-14 ENCOUNTER — Encounter (HOSPITAL_COMMUNITY)
Admission: RE | Admit: 2015-09-14 | Discharge: 2015-09-14 | Disposition: A | Discharge status: Home / Self Care | Payer: Medicare Other | Source: Ambulatory Visit | Attending: Nephrology | Admitting: Nephrology

## 2015-09-14 DIAGNOSIS — D638 Anemia in other chronic diseases classified elsewhere: Secondary | ICD-10-CM | POA: Insufficient documentation

## 2015-09-14 DIAGNOSIS — N183 Chronic kidney disease, stage 3 (moderate): Secondary | ICD-10-CM | POA: Diagnosis not present

## 2015-09-14 LAB — FERRITIN: FERRITIN: 1144 ng/mL — AB (ref 11–307)

## 2015-09-14 LAB — HEMOGLOBIN AND HEMATOCRIT, BLOOD
HEMATOCRIT: 34.7 % — AB (ref 36.0–46.0)
HEMOGLOBIN: 11.3 g/dL — AB (ref 12.0–15.0)

## 2015-09-14 LAB — IRON AND TIBC
Iron: 89 ug/dL (ref 28–170)
SATURATION RATIOS: 39 % — AB (ref 10.4–31.8)
TIBC: 228 ug/dL — ABNORMAL LOW (ref 250–450)
UIBC: 139 ug/dL

## 2015-09-14 MED ORDER — DARBEPOETIN ALFA 100 MCG/0.5ML IJ SOSY
PREFILLED_SYRINGE | INTRAMUSCULAR | Status: AC
  Filled 2015-09-14: qty 0.5

## 2015-09-14 MED ORDER — DARBEPOETIN ALFA 100 MCG/0.5ML IJ SOSY
100.0000 ug | PREFILLED_SYRINGE | INTRAMUSCULAR | Status: DC
  Administered 2015-09-14: 100 ug via SUBCUTANEOUS

## 2015-09-15 NOTE — Progress Notes (Signed)
Results for [name], Prescious T (MRN [id number]) as of 09/15/2015 10:45  Ref. Range 09/14/2015 09:30  Iron Latest Ref Range: 28-170 ug/dL 89  UIBC Latest Units: ug/dL 409139  TIBC Latest Ref Range: 250-450 ug/dL 811228 (L)  Saturation Ratios Latest Ref Range: 10.4-31.8 % 39 (H)  Ferritin Latest Ref Range: 11-307 ng/mL 1144 (H)  Hemoglobin Latest Ref Range: 12.0-15.0 g/dL 91.411.3 (L)  HCT Latest Ref Range: 36.0-46.0 % 34.7 (L)

## 2015-09-21 ENCOUNTER — Other Ambulatory Visit: Payer: Self-pay | Admitting: "Endocrinology

## 2015-09-22 LAB — T4, FREE: Free T4: 1.27 ng/dL (ref 0.82–1.77)

## 2015-09-22 LAB — TSH: TSH: 0.818 u[IU]/mL (ref 0.450–4.500)

## 2015-09-22 LAB — HGB A1C W/O EAG: Hgb A1c MFr Bld: 5 % (ref 4.8–5.6)

## 2015-09-28 ENCOUNTER — Encounter: Payer: Self-pay | Admitting: "Endocrinology

## 2015-09-28 ENCOUNTER — Ambulatory Visit (INDEPENDENT_AMBULATORY_CARE_PROVIDER_SITE_OTHER): Payer: Medicare Other | Admitting: "Endocrinology

## 2015-09-28 VITALS — BP 133/77 | HR 69 | Ht 63.0 in | Wt 96.0 lb

## 2015-09-28 DIAGNOSIS — E039 Hypothyroidism, unspecified: Secondary | ICD-10-CM

## 2015-09-28 DIAGNOSIS — Z794 Long term (current) use of insulin: Secondary | ICD-10-CM | POA: Diagnosis not present

## 2015-09-28 DIAGNOSIS — K8681 Exocrine pancreatic insufficiency: Secondary | ICD-10-CM | POA: Insufficient documentation

## 2015-09-28 DIAGNOSIS — N183 Chronic kidney disease, stage 3 unspecified: Secondary | ICD-10-CM

## 2015-09-28 DIAGNOSIS — K8689 Other specified diseases of pancreas: Secondary | ICD-10-CM

## 2015-09-28 DIAGNOSIS — I10 Essential (primary) hypertension: Secondary | ICD-10-CM | POA: Diagnosis not present

## 2015-09-28 DIAGNOSIS — E1122 Type 2 diabetes mellitus with diabetic chronic kidney disease: Secondary | ICD-10-CM

## 2015-09-28 DIAGNOSIS — E785 Hyperlipidemia, unspecified: Secondary | ICD-10-CM

## 2015-09-28 MED ORDER — PANCRELIPASE (LIP-PROT-AMYL) 24000-76000 UNITS PO CPEP: 1.0000 | ORAL_CAPSULE | Freq: Three times a day (TID) | ORAL | Status: DC

## 2015-09-28 NOTE — Progress Notes (Signed)
Subjective:    Patient ID: [name], female    DOB: 1940-10-20,    Past Medical History  Diagnosis Date  . Hypertension   . Anemia   . GERD (gastroesophageal reflux disease)   . Hypothyroidism   . Family history of adverse reaction to anesthesia     "my daughter; they thought she was asleep and she wasn't"  . Hyperlipemia   . Pneumonia "1-2 times"  . Type II diabetes mellitus ([place])   . History of blood transfusion "several"    "all related to anemia"  . Arthritis     "knees" (10/16/2014)  . Chronic kidney disease     h/o transplant  . Frequent UTI   . Carotid artery stenosis    Past Surgical History  Procedure Laterality Date  . Kidney transplant Right 2004    at [facility]  . Kidney surgery      multiple  . Thyroidectomy      1/2 removed  . Back surgery    . Tonsillectomy    . Appendectomy    . Cholecystectomy open    . Lumbar disc surgery      "L5"  . Abdominal hysterectomy    . Tubal ligation     Social History   Social History  . Marital Status: Widowed    Spouse Name: N/A  . Number of Children: 5  . Years of Education: N/A   Occupational History  . Retired    Social History Main Topics  . Smoking status: Never Smoker   . Smokeless tobacco: Never Used  . Alcohol Use: No  . Drug Use: No  . Sexual Activity: No   Other Topics Concern  . None   Social History Narrative   Outpatient Encounter Prescriptions as of 09/28/2015  Medication Sig  . ANORO ELLIPTA 62.5-25 MCG/INH AEPB   . aspirin 81 MG tablet Take 81 mg by mouth at bedtime.   . Blood Glucose Monitoring Suppl (ACCU-CHEK AVIVA PLUS) w/Device KIT Use as directed 2 x daily  . clonazePAM (KLONOPIN) 1 MG tablet Take 1 mg by mouth at bedtime.  . dicyclomine (BENTYL) 10 MG capsule Take 10 mg by mouth 4 (four) times daily -  before meals and at bedtime.  . ferrous gluconate (FERGON) 324 MG tablet Take 324 mg by mouth daily with breakfast.   . fluconazole (DIFLUCAN) 100 MG tablet Take 100 mg  by mouth daily.  Marland Kitchen glucose blood (ACCU-CHEK AVIVA) test strip Use as instructed bid  . hyoscyamine (LEVSIN, ANASPAZ) 0.125 MG tablet Take 0.125 mg by mouth every 4 (four) hours as needed for cramping.   Marland Kitchen levothyroxine (SYNTHROID, LEVOTHROID) 75 MCG tablet Take 75 mcg by mouth daily before breakfast.  . magnesium oxide (MAG-OX) 400 MG tablet Take 400 mg by mouth daily.  . metoprolol (LOPRESSOR) 50 MG tablet Take 50 mg by mouth 2 (two) times daily.  . mycophenolate (CELLCEPT) 250 MG capsule Take 250 mg by mouth 2 (two) times daily.  . Pancrelipase, Lip-Prot-Amyl, 24000 units CPEP Take 1 capsule (24,000 Units total) by mouth 3 (three) times daily with meals.  . pantoprazole (PROTONIX) 40 MG tablet Take 40 mg by mouth daily.  . Probiotic Product (PROBIOTIC DAILY PO) Take 1 tablet by mouth daily.  . sodium bicarbonate 650 MG tablet Take 1 tablet (650 mg total) by mouth 2 (two) times daily.  . tacrolimus (PROGRAF) 0.5 MG capsule Take 0.5 mg by mouth 2 (two) times daily. 1 mg in  the morning and 0.5 mg nightly.  . traMADol (ULTRAM) 50 MG tablet Take 50 mg by mouth every 6 (six) hours as needed for moderate pain.   . [DISCONTINUED] Insulin Glargine (LANTUS SOLOSTAR) 100 UNIT/ML Solostar Pen Inject 6 Units into the skin at bedtime.  . [DISCONTINUED] Pancrelipase, Lip-Prot-Amyl, (ZENPEP) 5000 units CPEP Take by mouth.   No facility-administered encounter medications on file as of 09/28/2015.   ALLERGIES: Allergies  Allergen Reactions  . Codeine Nausea And Vomiting  . Elavil [Amitriptyline] Other (See Comments)    unknown  . Iodinated Diagnostic Agents      Renal transplant pt  . Lipitor [Atorvastatin]   . Magnesium Hives    Pt tolerated oral and IV during 06/2014 admission   . Metoclopramide     Other reaction(s): Other (See Comments) "doesn't work"  . Morphine     Other reaction(s): Confusion (intolerance)  . Morphine And Related   . Neurontin [Gabapentin]   . Niacin And Related   .  Norvasc [Amlodipine Besylate]   . Talwin [Pentazocine]   . Ultram [Tramadol]    VACCINATION STATUS:  There is no immunization history on file for this patient.  Diabetes She presents for her follow-up diabetic visit. She has type 2 diabetes mellitus. 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  Her disease course has been improving. There are no hypoglycemic associated symptoms. Pertinent negatives for hypoglycemia include no confusion, headaches, pallor or seizures. Associated symptoms include fatigue. Pertinent negatives for diabetes include no chest pain, no polydipsia, no polyphagia and no polyuria. There are no hypoglycemic complications. Symptoms are improving. Diabetic complications include nephropathy. Her weight is stable (She is initiated on Creon since last visit for probable malabsorption.). She never participates in exercise. There is no change in her home blood glucose trend. Her breakfast blood glucose range is generally 90-110 mg/dl. Her overall blood glucose range is 90-110 mg/dl. An ACE inhibitor/angiotensin II receptor blocker is not being taken.  Hypertension Pertinent negatives include no chest pain, headaches, palpitations or shortness of breath. Hypertensive end-organ damage includes a thyroid problem.  Hyperlipidemia Pertinent negatives include no chest pain, myalgias or shortness of breath.  Thyroid Problem Presents for follow-up visit. Symptoms include fatigue. Patient reports no cold intolerance, diarrhea, heat intolerance or palpitations. The symptoms have been stable. Past treatments include levothyroxine. Her past medical history is significant for hyperlipidemia.     Review of Systems  Constitutional: Positive for fatigue. Negative for unexpected weight change.  HENT: Negative for trouble swallowing and voice change.   Eyes: Negative for visual disturbance.   Respiratory: Negative for cough, shortness of breath and wheezing.   Cardiovascular: Negative for chest pain, palpitations and leg swelling.  Gastrointestinal: Negative for nausea, vomiting and diarrhea.  Endocrine: Negative for cold intolerance, heat intolerance, polydipsia, polyphagia and polyuria.  Musculoskeletal: Negative for myalgias and arthralgias.  Skin: Negative for color change, pallor, rash and wound.  Neurological: Negative for seizures and headaches.  Psychiatric/Behavioral: Negative for suicidal ideas and confusion.    Objective:    BP 133/77 mmHg  Pulse 69  Ht _0  (1.6 m)  Wt 96 lb (43.545 kg)  BMI 17.01 kg/m2  SpO2 97%  Wt Readings from Last 3 Encounters:  09/28/15 96 lb (43.545 kg)  09/14/15 98 lb (44.453 kg)  07/20/15 98 lb (44.453 kg)    Physical Exam  Constitutional: She is oriented to person, place,  and time. She appears well-developed.  HENT:  Head: Normocephalic and atraumatic.  Eyes: EOM are normal.  Neck: Normal range of motion. Neck supple. No tracheal deviation present. No thyromegaly present.  Cardiovascular: Normal rate and regular rhythm.   Pulmonary/Chest: Effort normal and breath sounds normal.  Abdominal: Soft. Bowel sounds are normal. There is no tenderness. There is no guarding.  Musculoskeletal: Normal range of motion. She exhibits no edema.  Neurological: She is alert and oriented to person, place, and time. She has normal reflexes. No cranial nerve deficit. Coordination normal.  Skin: Skin is warm and dry. No rash noted. No erythema. No pallor.  Psychiatric: She has a normal mood and affect. Judgment normal.    Results for orders placed or performed in visit on 09/21/15  Hgb A1c w/o eAG  Result Value Ref Range   Hgb A1c MFr Bld 5.0 4.8 - 5.6 %  T4, free  Result Value Ref Range   Free T4 1.27 0.82 - 1.77 ng/dL  TSH  Result Value Ref Range   TSH 0.818 0.450 - 4.500 uIU/mL   Diabetic Labs (most recent): Lab Results  Component  Value Date   HGBA1C 5.0 09/21/2015   HGBA1C 5.2 01/14/2015   HGBA1C 5.9* 10/17/2014   Lipid profile (most recent): Lab Results  Component Value Date   TRIG 208* 10/17/2014   CHOL 138 10/17/2014         Assessment & Plan:   1. 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  Her   diabetes is  complicated by CKD following with Nephrology. Patient came with stable glucose profile, and  recent A1c of 5 %.  Glucose logs and insulin administration records pertaining to this visit,  to be scanned into patient's records.  Recent labs reviewed. - Patient remains at a high risk for more acute and chronic complications of diabetes which include CAD, CVA, CKD, retinopathy, and neuropathy. These are all discussed in detail with the patient.  - I have re-counseled the patient on diet management and   by adopting a complex carbohydrate and protein rich  Diet. - Patient is advised to stick to a routine mealtimes to eat 3 meals  a day and snacks as necessary to avoid hypoglycemia.  - I have approached patient with the following individualized plan to manage diabetes and patient agrees.  - I will hold insulin  Therapy For now . - She will continue to monitor blood glucose every morning and at bedtime.  -Patient is encouraged to call clinic for blood glucose levels less than 70 or above 200 mg /dl. -Patient is not a candidate for metformin norSGLT2 inhibitors due to CKD. -She will be resumed on  Creon for  pancreatic insufficiency associated with malabsorption. I advised her to take 1 caps with meals  3 times a day she is not a candidate for incretin therapy . - Patient specific target  for A1c; LDL, HDL, Triglycerides, and  Waist Circumference were discussed in detail.  2) BP/HTN: Controlled.   3)  Weight/Diet:  She is encouraged to consume more carbs, protein and fruits and vegetables.  4) Chronic Care/Health Maintenance:  -Patient is  encouraged to continue to follow up with Ophthalmology, Podiatrist at least yearly or according to recommendations, and advised to stay away from smoking. I have recommended  yearly flu vaccine and pneumonia vaccination at least every 5 years; and  sleep for at least 7 hours a day.  I advised patient to maintain close follow up with their PCP for primary care needs and her nephrologist for CKD.   Patient is asked to bring meter and  blood glucose logs during their next visit.   5) Hypothyroidism, unspecified hypothyroidism type  Continue her levothyroxine at 75 g by mouth every morning. Decision to adjust her levothyroxine would be based on T4 or free T4 in stead of TSH.   - We discussed about correct intake of levothyroxine, at fasting, with water, separated by at least 30 minutes from breakfast, and separated by more than 4 hours from calcium, iron, multivitamins, acid reflux medications (PPIs). -Patient is made aware of the fact that thyroid hormone replacement is needed for life, dose to be adjusted by periodic monitoring of thyroid function tests.  Follow up plan: Return in about 3 months (around 12/29/2015) for diabetes, high blood pressure, high cholesterol, underactive thyroid, follow up with pre-visit labs, meter, and logs.  Glade Lloyd, MD Phone: [phone]  Fax: [phone]   09/28/2015, 4:12 PM

## 2015-10-13 ENCOUNTER — Encounter (HOSPITAL_COMMUNITY)
Admission: RE | Admit: 2015-10-13 | Discharge: 2015-10-13 | Disposition: A | Discharge status: Home / Self Care | Payer: Medicare Other | Source: Ambulatory Visit | Attending: Nephrology | Admitting: Nephrology

## 2015-10-13 DIAGNOSIS — N183 Chronic kidney disease, stage 3 (moderate): Secondary | ICD-10-CM | POA: Diagnosis not present

## 2015-10-13 LAB — IRON AND TIBC
IRON: 32 ug/dL (ref 28–170)
Saturation Ratios: 14 % (ref 10.4–31.8)
TIBC: 225 ug/dL — AB (ref 250–450)
UIBC: 193 ug/dL

## 2015-10-13 LAB — FERRITIN: Ferritin: 1038 ng/mL — ABNORMAL HIGH (ref 11–307)

## 2015-10-13 LAB — HEMOGLOBIN AND HEMATOCRIT, BLOOD
HEMATOCRIT: 33.3 % — AB (ref 36.0–46.0)
Hemoglobin: 10.7 g/dL — ABNORMAL LOW (ref 12.0–15.0)

## 2015-10-13 MED ORDER — DARBEPOETIN ALFA 100 MCG/0.5ML IJ SOSY
100.0000 ug | PREFILLED_SYRINGE | INTRAMUSCULAR | Status: DC
  Administered 2015-10-13: 100 ug via SUBCUTANEOUS
  Filled 2015-10-13: qty 0.5

## 2015-11-10 ENCOUNTER — Encounter (HOSPITAL_COMMUNITY)
Admission: RE | Admit: 2015-11-10 | Discharge: 2015-11-10 | Disposition: A | Discharge status: Home / Self Care | Payer: Medicare Other | Source: Ambulatory Visit | Attending: Nephrology | Admitting: Nephrology

## 2015-11-10 DIAGNOSIS — N183 Chronic kidney disease, stage 3 (moderate): Secondary | ICD-10-CM | POA: Diagnosis present

## 2015-11-10 DIAGNOSIS — D638 Anemia in other chronic diseases classified elsewhere: Secondary | ICD-10-CM | POA: Insufficient documentation

## 2015-11-10 LAB — HEMOGLOBIN AND HEMATOCRIT, BLOOD
HCT: 32.4 % — ABNORMAL LOW (ref 36.0–46.0)
HEMOGLOBIN: 10.7 g/dL — AB (ref 12.0–15.0)

## 2015-11-10 LAB — IRON AND TIBC
IRON: 24 ug/dL — AB (ref 28–170)
SATURATION RATIOS: 11 % (ref 10.4–31.8)
TIBC: 210 ug/dL — AB (ref 250–450)
UIBC: 186 ug/dL

## 2015-11-10 LAB — FERRITIN: Ferritin: 1180 ng/mL — ABNORMAL HIGH (ref 11–307)

## 2015-11-10 MED ORDER — DARBEPOETIN ALFA 100 MCG/0.5ML IJ SOSY
100.0000 ug | PREFILLED_SYRINGE | INTRAMUSCULAR | Status: DC
  Administered 2015-11-10: 100 ug via SUBCUTANEOUS

## 2015-11-10 MED ORDER — DARBEPOETIN ALFA 100 MCG/0.5ML IJ SOSY
PREFILLED_SYRINGE | INTRAMUSCULAR | Status: AC
  Filled 2015-11-10: qty 0.5

## 2015-11-11 NOTE — Progress Notes (Signed)
Results for [name], Deanna T (MRN [id number]) as of 11/11/2015 13:34  Ref. Range 11/10/2015 10:02  Iron Latest Ref Range: 28-170 ug/dL 24 (L)  UIBC Latest Units: ug/dL 409186  TIBC Latest Ref Range: 250-450 ug/dL 811210 (L)  Saturation Ratios Latest Ref Range: 10.4-31.8 % 11  Ferritin Latest Ref Range: 11-307 ng/mL 1180 (H)  Hemoglobin Latest Ref Range: 12.0-15.0 g/dL 91.410.7 (L)  HCT Latest Ref Range: 36.0-46.0 % 32.4 (L)

## 2015-12-08 ENCOUNTER — Encounter (HOSPITAL_COMMUNITY)
Admission: RE | Admit: 2015-12-08 | Discharge: 2015-12-08 | Disposition: A | Discharge status: Home / Self Care | Payer: Medicare Other | Source: Ambulatory Visit | Attending: Nephrology | Admitting: Nephrology

## 2015-12-08 DIAGNOSIS — D638 Anemia in other chronic diseases classified elsewhere: Secondary | ICD-10-CM | POA: Insufficient documentation

## 2015-12-08 DIAGNOSIS — N183 Chronic kidney disease, stage 3 (moderate): Secondary | ICD-10-CM | POA: Insufficient documentation

## 2015-12-08 LAB — IRON AND TIBC
Iron: 106 ug/dL (ref 28–170)
SATURATION RATIOS: 42 % — AB (ref 10.4–31.8)
TIBC: 253 ug/dL (ref 250–450)
UIBC: 147 ug/dL

## 2015-12-08 LAB — HEMOGLOBIN AND HEMATOCRIT, BLOOD
HEMATOCRIT: 29.7 % — AB (ref 36.0–46.0)
Hemoglobin: 9.6 g/dL — ABNORMAL LOW (ref 12.0–15.0)

## 2015-12-08 LAB — FERRITIN: Ferritin: 1114 ng/mL — ABNORMAL HIGH (ref 11–307)

## 2015-12-08 MED ORDER — DARBEPOETIN ALFA 100 MCG/0.5ML IJ SOSY
PREFILLED_SYRINGE | INTRAMUSCULAR | Status: AC
  Filled 2015-12-08: qty 0.5

## 2015-12-08 MED ORDER — SODIUM CHLORIDE 0.9 % IV SOLN
510.0000 mg | Freq: Once | INTRAVENOUS | Status: AC
  Administered 2015-12-08: 510 mg via INTRAVENOUS
  Filled 2015-12-08: qty 17

## 2015-12-08 MED ORDER — SODIUM CHLORIDE 0.9 % IV SOLN
INTRAVENOUS | Status: DC
  Administered 2015-12-08: 250 mL via INTRAVENOUS

## 2015-12-08 MED ORDER — DARBEPOETIN ALFA 100 MCG/0.5ML IJ SOSY
100.0000 ug | PREFILLED_SYRINGE | Freq: Once | INTRAMUSCULAR | Status: AC
  Administered 2015-12-08: 100 ug via SUBCUTANEOUS

## 2015-12-08 NOTE — Progress Notes (Signed)
Results for [name], [name] (MRN [id number]) as of 12/08/2015 14:17  Ref. Range 12/08/2015 10:32 12/08/2015 10:33  Iron Latest Ref Range: 28 - 170 ug/dL 375   UIBC Latest Units: ug/dL 436   TIBC Latest Ref Range: 250 - 450 ug/dL 067   Saturation Ratios Latest Ref Range: 10.4 - 31.8 % 42 (H)   Ferritin Latest Ref Range: 11 - 307 ng/mL 1,114 (H)   Hemoglobin Latest Ref Range: 12.0 - 15.0 g/dL  9.6 (L)  HCT Latest Ref Range: 36.0 - 46.0 %  29.7 (L)

## 2015-12-09 ENCOUNTER — Other Ambulatory Visit (HOSPITAL_COMMUNITY): Payer: Self-pay | Admitting: *Deleted

## 2015-12-09 DIAGNOSIS — R6881 Early satiety: Secondary | ICD-10-CM

## 2015-12-09 DIAGNOSIS — R1011 Right upper quadrant pain: Secondary | ICD-10-CM

## 2015-12-12 ENCOUNTER — Encounter (HOSPITAL_COMMUNITY): Payer: Self-pay | Admitting: Emergency Medicine

## 2015-12-12 ENCOUNTER — Inpatient Hospital Stay (HOSPITAL_COMMUNITY)
Admission: EM | Admit: 2015-12-12 | Discharge: 2015-12-14 | DRG: 683 | Disposition: A | Discharge status: Home / Self Care | Payer: Medicare Other | Attending: Internal Medicine | Admitting: Internal Medicine

## 2015-12-12 ENCOUNTER — Inpatient Hospital Stay (HOSPITAL_COMMUNITY): Payer: Medicare Other

## 2015-12-12 DIAGNOSIS — E119 Type 2 diabetes mellitus without complications: Secondary | ICD-10-CM

## 2015-12-12 DIAGNOSIS — N185 Chronic kidney disease, stage 5: Secondary | ICD-10-CM | POA: Diagnosis present

## 2015-12-12 DIAGNOSIS — I120 Hypertensive chronic kidney disease with stage 5 chronic kidney disease or end stage renal disease: Secondary | ICD-10-CM | POA: Diagnosis present

## 2015-12-12 DIAGNOSIS — N189 Chronic kidney disease, unspecified: Secondary | ICD-10-CM | POA: Diagnosis not present

## 2015-12-12 DIAGNOSIS — E872 Acidosis: Secondary | ICD-10-CM | POA: Diagnosis present

## 2015-12-12 DIAGNOSIS — N390 Urinary tract infection, site not specified: Secondary | ICD-10-CM | POA: Diagnosis present

## 2015-12-12 DIAGNOSIS — E440 Moderate protein-calorie malnutrition: Secondary | ICD-10-CM | POA: Diagnosis present

## 2015-12-12 DIAGNOSIS — Z8249 Family history of ischemic heart disease and other diseases of the circulatory system: Secondary | ICD-10-CM

## 2015-12-12 DIAGNOSIS — R531 Weakness: Secondary | ICD-10-CM | POA: Diagnosis present

## 2015-12-12 DIAGNOSIS — E860 Dehydration: Secondary | ICD-10-CM | POA: Diagnosis not present

## 2015-12-12 DIAGNOSIS — Z681 Body mass index (BMI) 19 or less, adult: Secondary | ICD-10-CM

## 2015-12-12 DIAGNOSIS — Z7982 Long term (current) use of aspirin: Secondary | ICD-10-CM | POA: Diagnosis not present

## 2015-12-12 DIAGNOSIS — D696 Thrombocytopenia, unspecified: Secondary | ICD-10-CM | POA: Diagnosis present

## 2015-12-12 DIAGNOSIS — Z808 Family history of malignant neoplasm of other organs or systems: Secondary | ICD-10-CM

## 2015-12-12 DIAGNOSIS — E785 Hyperlipidemia, unspecified: Secondary | ICD-10-CM | POA: Diagnosis present

## 2015-12-12 DIAGNOSIS — N184 Chronic kidney disease, stage 4 (severe): Secondary | ICD-10-CM | POA: Diagnosis not present

## 2015-12-12 DIAGNOSIS — K219 Gastro-esophageal reflux disease without esophagitis: Secondary | ICD-10-CM | POA: Diagnosis present

## 2015-12-12 DIAGNOSIS — E039 Hypothyroidism, unspecified: Secondary | ICD-10-CM | POA: Diagnosis present

## 2015-12-12 DIAGNOSIS — Z940 Kidney transplant status: Secondary | ICD-10-CM | POA: Diagnosis not present

## 2015-12-12 DIAGNOSIS — E118 Type 2 diabetes mellitus with unspecified complications: Secondary | ICD-10-CM

## 2015-12-12 DIAGNOSIS — E1122 Type 2 diabetes mellitus with diabetic chronic kidney disease: Secondary | ICD-10-CM | POA: Diagnosis present

## 2015-12-12 DIAGNOSIS — N179 Acute kidney failure, unspecified: Secondary | ICD-10-CM | POA: Diagnosis not present

## 2015-12-12 DIAGNOSIS — Z833 Family history of diabetes mellitus: Secondary | ICD-10-CM | POA: Diagnosis not present

## 2015-12-12 DIAGNOSIS — I10 Essential (primary) hypertension: Secondary | ICD-10-CM

## 2015-12-12 DIAGNOSIS — D631 Anemia in chronic kidney disease: Secondary | ICD-10-CM | POA: Diagnosis present

## 2015-12-12 DIAGNOSIS — D649 Anemia, unspecified: Secondary | ICD-10-CM

## 2015-12-12 LAB — CBC WITH DIFFERENTIAL/PLATELET
Basophils Absolute: 0 10*3/uL (ref 0.0–0.1)
Basophils Relative: 0 %
EOS ABS: 0.1 10*3/uL (ref 0.0–0.7)
EOS PCT: 4 %
HCT: 23.5 % — ABNORMAL LOW (ref 36.0–46.0)
Hemoglobin: 7.6 g/dL — ABNORMAL LOW (ref 12.0–15.0)
LYMPHS ABS: 0.6 10*3/uL — AB (ref 0.7–4.0)
Lymphocytes Relative: 16 %
MCH: 29.1 pg (ref 26.0–34.0)
MCHC: 32.3 g/dL (ref 30.0–36.0)
MCV: 90 fL (ref 78.0–100.0)
MONO ABS: 0.5 10*3/uL (ref 0.1–1.0)
Monocytes Relative: 12 %
Neutro Abs: 2.6 10*3/uL (ref 1.7–7.7)
Neutrophils Relative %: 68 %
PLATELETS: 109 10*3/uL — AB (ref 150–400)
RBC: 2.61 MIL/uL — AB (ref 3.87–5.11)
RDW: 17.1 % — AB (ref 11.5–15.5)
WBC: 3.9 10*3/uL — AB (ref 4.0–10.5)

## 2015-12-12 LAB — URINALYSIS, ROUTINE W REFLEX MICROSCOPIC
Bilirubin Urine: NEGATIVE
Glucose, UA: NEGATIVE mg/dL
Ketones, ur: NEGATIVE mg/dL
Nitrite: NEGATIVE
PROTEIN: NEGATIVE mg/dL
Specific Gravity, Urine: 1.01 (ref 1.005–1.030)
pH: 6 (ref 5.0–8.0)

## 2015-12-12 LAB — URINE MICROSCOPIC-ADD ON

## 2015-12-12 LAB — TROPONIN I

## 2015-12-12 LAB — CBG MONITORING, ED: GLUCOSE-CAPILLARY: 116 mg/dL — AB (ref 65–99)

## 2015-12-12 LAB — PROTIME-INR
INR: 1.09
PROTHROMBIN TIME: 14.2 s (ref 11.4–15.2)

## 2015-12-12 LAB — COMPREHENSIVE METABOLIC PANEL
ALT: 10 U/L — ABNORMAL LOW (ref 14–54)
ANION GAP: 10 (ref 5–15)
AST: 13 U/L — AB (ref 15–41)
Albumin: 3.7 g/dL (ref 3.5–5.0)
Alkaline Phosphatase: 93 U/L (ref 38–126)
BILIRUBIN TOTAL: 0.3 mg/dL (ref 0.3–1.2)
BUN: 57 mg/dL — AB (ref 6–20)
CALCIUM: 8.6 mg/dL — AB (ref 8.9–10.3)
CO2: 17 mmol/L — ABNORMAL LOW (ref 22–32)
Chloride: 108 mmol/L (ref 101–111)
Creatinine, Ser: 3.58 mg/dL — ABNORMAL HIGH (ref 0.44–1.00)
GFR calc Af Amer: 14 mL/min — ABNORMAL LOW (ref >60)
GFR, EST NON AFRICAN AMERICAN: 12 mL/min — AB (ref >60)
Glucose, Bld: 114 mg/dL — ABNORMAL HIGH (ref 65–99)
POTASSIUM: 4 mmol/L (ref 3.5–5.1)
Sodium: 135 mmol/L (ref 135–145)
TOTAL PROTEIN: 6.3 g/dL — AB (ref 6.5–8.1)

## 2015-12-12 LAB — GLUCOSE, CAPILLARY: GLUCOSE-CAPILLARY: 122 mg/dL — AB (ref 65–99)

## 2015-12-12 LAB — PREPARE RBC (CROSSMATCH)

## 2015-12-12 LAB — ABO/RH: ABO/RH(D): O POS

## 2015-12-12 LAB — LACTIC ACID, PLASMA
Lactic Acid, Venous: 1 mmol/L (ref 0.5–1.9)
Lactic Acid, Venous: 0.6 mmol/L (ref 0.5–1.9)

## 2015-12-12 LAB — TSH: TSH: 1.196 u[IU]/mL (ref 0.350–4.500)

## 2015-12-12 MED ORDER — SODIUM CHLORIDE 0.9 % IV BOLUS (SEPSIS)
500.0000 mL | Freq: Once | INTRAVENOUS | Status: AC
  Administered 2015-12-12: 500 mL via INTRAVENOUS

## 2015-12-12 MED ORDER — TACROLIMUS 1 MG PO CAPS
1.0000 mg | ORAL_CAPSULE | Freq: Every day | ORAL | Status: DC
  Administered 2015-12-13 – 2015-12-14 (×2): 1 mg via ORAL
  Filled 2015-12-12 (×4): qty 1

## 2015-12-12 MED ORDER — DICYCLOMINE HCL 10 MG PO CAPS
10.0000 mg | ORAL_CAPSULE | Freq: Three times a day (TID) | ORAL | Status: DC
  Administered 2015-12-13 – 2015-12-14 (×5): 10 mg via ORAL
  Filled 2015-12-12 (×6): qty 1

## 2015-12-12 MED ORDER — MAGNESIUM OXIDE 400 (241.3 MG) MG PO TABS
400.0000 mg | ORAL_TABLET | Freq: Every day | ORAL | Status: DC
  Administered 2015-12-13 – 2015-12-14 (×2): 400 mg via ORAL
  Filled 2015-12-12 (×5): qty 1

## 2015-12-12 MED ORDER — INSULIN ASPART 100 UNIT/ML ~~LOC~~ SOLN: 0.0000 [IU] | Freq: Three times a day (TID) | SUBCUTANEOUS | Status: DC

## 2015-12-12 MED ORDER — ASPIRIN 81 MG PO CHEW
81.0000 mg | CHEWABLE_TABLET | Freq: Every day | ORAL | Status: DC
  Administered 2015-12-13: 81 mg via ORAL
  Filled 2015-12-12 (×4): qty 1

## 2015-12-12 MED ORDER — CLONAZEPAM 0.5 MG PO TABS
1.0000 mg | ORAL_TABLET | Freq: Every day | ORAL | Status: DC
  Administered 2015-12-12 – 2015-12-13 (×2): 1 mg via ORAL
  Filled 2015-12-12 (×2): qty 2

## 2015-12-12 MED ORDER — SODIUM CHLORIDE 0.9 % IV SOLN
INTRAVENOUS | Status: DC
  Administered 2015-12-12 – 2015-12-13 (×2): via INTRAVENOUS

## 2015-12-12 MED ORDER — ONDANSETRON HCL 4 MG/2ML IJ SOLN: 4.0000 mg | Freq: Three times a day (TID) | INTRAMUSCULAR | Status: DC | PRN

## 2015-12-12 MED ORDER — SODIUM CHLORIDE 0.9 % IV BOLUS (SEPSIS)
1000.0000 mL | Freq: Once | INTRAVENOUS | Status: AC
  Administered 2015-12-12: 1000 mL via INTRAVENOUS

## 2015-12-12 MED ORDER — TRAMADOL HCL 50 MG PO TABS: 50.0000 mg | ORAL_TABLET | Freq: Two times a day (BID) | ORAL | Status: DC | PRN

## 2015-12-12 MED ORDER — ONDANSETRON 4 MG PO TBDP
4.0000 mg | ORAL_TABLET | Freq: Once | ORAL | Status: AC
  Administered 2015-12-12: 4 mg via ORAL
  Filled 2015-12-12: qty 1

## 2015-12-12 MED ORDER — ONDANSETRON HCL 4 MG/2ML IJ SOLN: 4.0000 mg | Freq: Four times a day (QID) | INTRAMUSCULAR | Status: DC | PRN

## 2015-12-12 MED ORDER — ONDANSETRON HCL 4 MG PO TABS: 4.0000 mg | ORAL_TABLET | Freq: Four times a day (QID) | ORAL | Status: DC | PRN

## 2015-12-12 MED ORDER — HYOSCYAMINE SULFATE 0.125 MG PO TABS
0.1250 mg | ORAL_TABLET | ORAL | Status: DC | PRN
Start: 2015-12-12 — End: 2015-12-14
  Filled 2015-12-12: qty 1

## 2015-12-12 MED ORDER — SODIUM CHLORIDE 0.9 % IV SOLN
10.0000 mL/h | Freq: Once | INTRAVENOUS | Status: AC
  Administered 2015-12-12: 10 mL/h via INTRAVENOUS

## 2015-12-12 MED ORDER — PANTOPRAZOLE SODIUM 40 MG PO TBEC
40.0000 mg | DELAYED_RELEASE_TABLET | Freq: Every day | ORAL | Status: DC
  Administered 2015-12-13 – 2015-12-14 (×2): 40 mg via ORAL
  Filled 2015-12-12 (×3): qty 1

## 2015-12-12 MED ORDER — DEXTROSE 5 % IV SOLN
1.0000 g | INTRAVENOUS | Status: DC
  Administered 2015-12-13: 1 g via INTRAVENOUS
  Filled 2015-12-12 (×3): qty 10

## 2015-12-12 MED ORDER — FLUCONAZOLE 100 MG PO TABS
100.0000 mg | ORAL_TABLET | Freq: Every day | ORAL | Status: DC
  Administered 2015-12-13 – 2015-12-14 (×2): 100 mg via ORAL
  Filled 2015-12-12 (×3): qty 1

## 2015-12-12 MED ORDER — LEVOTHYROXINE SODIUM 75 MCG PO TABS
75.0000 ug | ORAL_TABLET | Freq: Every day | ORAL | Status: DC
  Administered 2015-12-13 – 2015-12-14 (×2): 75 ug via ORAL
  Filled 2015-12-12 (×3): qty 1

## 2015-12-12 MED ORDER — ACETAMINOPHEN 325 MG PO TABS: 650.0000 mg | ORAL_TABLET | Freq: Four times a day (QID) | ORAL | Status: DC | PRN

## 2015-12-12 MED ORDER — TACROLIMUS 0.5 MG PO CAPS
0.5000 mg | ORAL_CAPSULE | Freq: Every day | ORAL | Status: DC
  Filled 2015-12-12 (×4): qty 1

## 2015-12-12 MED ORDER — METOPROLOL TARTRATE 50 MG PO TABS
50.0000 mg | ORAL_TABLET | Freq: Two times a day (BID) | ORAL | Status: DC
  Administered 2015-12-12 – 2015-12-14 (×4): 50 mg via ORAL
  Filled 2015-12-12 (×5): qty 1

## 2015-12-12 MED ORDER — INSULIN ASPART 100 UNIT/ML ~~LOC~~ SOLN: 0.0000 [IU] | Freq: Every day | SUBCUTANEOUS | Status: DC

## 2015-12-12 MED ORDER — SODIUM BICARBONATE 650 MG PO TABS
1300.0000 mg | ORAL_TABLET | Freq: Every day | ORAL | Status: DC
  Administered 2015-12-13 – 2015-12-14 (×2): 1300 mg via ORAL
  Filled 2015-12-12 (×3): qty 2

## 2015-12-12 MED ORDER — MYCOPHENOLATE MOFETIL 250 MG PO CAPS
500.0000 mg | ORAL_CAPSULE | Freq: Two times a day (BID) | ORAL | Status: DC
  Administered 2015-12-13: 500 mg via ORAL
  Administered 2015-12-13: 250 mg via ORAL
  Administered 2015-12-14: 500 mg via ORAL
  Filled 2015-12-12 (×8): qty 2

## 2015-12-12 MED ORDER — DEXTROSE 5 % IV SOLN
1.0000 g | Freq: Once | INTRAVENOUS | Status: AC
  Administered 2015-12-12: 1 g via INTRAVENOUS
  Filled 2015-12-12: qty 10

## 2015-12-12 MED ORDER — ACETAMINOPHEN 650 MG RE SUPP: 650.0000 mg | Freq: Four times a day (QID) | RECTAL | Status: DC | PRN

## 2015-12-12 NOTE — ED Triage Notes (Addendum)
Patient c/o generalized weakness with nausea, tremors, and intermittent fevers. Per patient symptoms x2 weeks. Patient on antibiotics for UTI which patient states "seems to be improving." Daughter reports patient is a kidney transplant patient. Patient states "I haven't really been able to eat anything because of the nausea." Per patient the dizziness is upon standing or sudden movements.

## 2015-12-12 NOTE — ED Notes (Signed)
Patient given graham cracker and coke at this time.

## 2015-12-12 NOTE — ED Notes (Signed)
cbg of 116.

## 2015-12-12 NOTE — Progress Notes (Signed)
Received report from  Robin in ED  regarding patient coming to rm [id number]. Lesly Dukes, RN

## 2015-12-12 NOTE — Progress Notes (Signed)
Patient arrived to unit. VSS. Patient is in no distress/pain. Order/chart reviewed. RN will continue to monitor. Rotha Cassels J Everett, RN  

## 2015-12-12 NOTE — H&P (Signed)
History and Physical    [name]:[id number] DOB: [birth year] DOA: 12/12/2015  Referring MD/NP/PA: Noemi Chapel, MD PCP: [name], DO  Outpatient Specialists:   Nephrology; Fleet Contras, MD  Endocrinology; Dr. Dorris Fetch  Patient coming from: home  Chief Complaint: Fatigue  HPI: [name] is a 75 y.o. 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 She reports her symptoms began when she fell she was developing a urinary tract infection approximately 2 weeks ago. She was seen by her primary care physician who prescribed her a course of Bactrim. She had taken this medication and completed it on 725. Despite completing her antibiotics, family reports that she continued to feel febrile. She's been generally weak and nauseous for the past several days. By mouth intake has been poor. She has not had any cough. She does feel short of breath and notices her heart racing on exertion. She's not had any vomiting or diarrhea. Her progressive weakness she was brought to the ER for further evaluation.  ED Course: Patient was evaluated in the emergency room and found to have a elevated creatinine above her baseline. Baseline creatinine appears to be around 2.1. On admission she was found to have a creatinine of 3.5. She was also noted to have a declining hemoglobin at 7.6. She also had thrombocytopenia at 109. Clinically she appeared to be dehydrated. Urinalysis indicated possible infection. She has been referred for admission.  Review of Systems: As per HPI otherwise 10 point review of systems negative.   Past Medical History:  Diagnosis Date  . Anemia   . Arthritis    "knees" (10/16/2014)  . Carotid artery stenosis   . Chronic kidney disease    h/o transplant  . Family history of adverse reaction to anesthesia      "my daughter; they thought she was asleep and she wasn't"  . Frequent UTI   . GERD (gastroesophageal reflux disease)   . History of blood transfusion "several"   "all related to anemia"  . Hyperlipemia   . Hypertension   . Hypothyroidism   . Pneumonia "1-2 times"  . Type II diabetes mellitus ([place])     Past Surgical History:  Procedure Laterality Date  . ABDOMINAL HYSTERECTOMY    . APPENDECTOMY    . BACK SURGERY    . CHOLECYSTECTOMY OPEN    . KIDNEY SURGERY     multiple  . KIDNEY TRANSPLANT Right 2004   at [facility]  . LUMBAR DISC SURGERY     "L5"  . THYROIDECTOMY     1/2 removed  . TONSILLECTOMY    . TUBAL LIGATION       reports that she has never smoked. She has never used smokeless tobacco. She reports that she does not drink alcohol or use drugs.  Allergies  Allergen Reactions  . Codeine Nausea And Vomiting  . Elavil [Amitriptyline] Other (See Comments)    unknown  . Iodinated Diagnostic Agents      Renal transplant pt  . Lipitor [Atorvastatin]   . Magnesium Hives    Pt tolerated oral and IV during 06/2014 admission   . Metoclopramide     Other reaction(s): Other (See Comments) "doesn't work"  . Morphine     Other reaction(s): Confusion (intolerance)  . Morphine And Related   . Neurontin [Gabapentin]   .  Niacin And Related   . Norvasc [Amlodipine Besylate]   . Talwin [Pentazocine]   . Ultram [Tramadol]     Family History  Problem Relation Age of Onset  . Diabetes Mother   . Hypertension Mother   . Melanoma Mother   . Hyperlipidemia Mother   . Heart disease Father   . Varicose Veins Father   . Hypertension Sister   . Cancer Daughter   . Hypertension Son   . Hyperlipidemia Sister   . Hyperlipidemia Daughter   . Hyperlipidemia Son   . Hypertension Daughter    Prior to Admission medications   Medication Sig Start Date End Date Taking? Authorizing Provider  aspirin 81 MG tablet Take 81 mg by mouth at bedtime.    Yes Historical Provider, MD   clonazePAM (KLONOPIN) 1 MG tablet Take 1 mg by mouth at bedtime.   Yes Historical Provider, MD  dicyclomine (BENTYL) 10 MG capsule Take 10 mg by mouth 3 (three) times daily before meals.    Yes Historical Provider, MD  fluconazole (DIFLUCAN) 100 MG tablet Take 100 mg by mouth daily.   Yes Historical Provider, MD  hyoscyamine (LEVSIN, ANASPAZ) 0.125 MG tablet Take 0.125 mg by mouth every 4 (four) hours as needed for cramping.    Yes Historical Provider, MD  levothyroxine (SYNTHROID, LEVOTHROID) 75 MCG tablet Take 75 mcg by mouth daily before breakfast.   Yes Historical Provider, MD  magnesium oxide (MAG-OX) 400 MG tablet Take 400 mg by mouth daily.   Yes Historical Provider, MD  metoprolol (LOPRESSOR) 50 MG tablet Take 50 mg by mouth 2 (two) times daily.   Yes Historical Provider, MD  mycophenolate (CELLCEPT) 500 MG tablet Take 500 mg by mouth 2 (two) times daily.   Yes Historical Provider, MD  pantoprazole (PROTONIX) 40 MG tablet Take 40 mg by mouth daily. 10/11/14  Yes Historical Provider, MD  sodium bicarbonate 650 MG tablet Take 1 tablet (650 mg total) by mouth 2 (two) times daily. Patient taking differently: Take 1,300 mg by mouth daily.  10/21/14  Yes Eugenie Filler, MD  tacrolimus (PROGRAF) 0.5 MG capsule Take 1 mg by mouth daily. 1 mg in the morning and 0.5 mg nightly.   Yes Historical Provider, MD  traMADol (ULTRAM) 50 MG tablet Take 50 mg by mouth every 6 (six) hours as needed for moderate pain.    Yes Historical Provider, MD  Blood Glucose Monitoring Suppl (ACCU-CHEK AVIVA PLUS) w/Device KIT Use as directed 2 x daily 06/19/15   Cassandria Anger, MD  glucose blood (ACCU-CHEK AVIVA) test strip Use as instructed bid 06/29/15   Cassandria Anger, MD    Physical Exam: Vitals:   12/12/15 1638 12/12/15 1654 12/12/15 1810 12/12/15 1839  BP: 109/57 144/66 118/71 (!) 133/53  Pulse: 78 80 76 (!) 117  Resp: 11  15 15   Temp:  97.4 F (36.3 C) 97.6 F (36.4 C) 98 F (36.7 C)  TempSrc:   Oral Oral Oral  SpO2: 100% 100% 100% 100%  Weight:  43.8 kg (96 lb 8 oz)    Height:  5' 3"  (1.6 m)        Constitutional: NAD, calm, comfortable Vitals:   12/12/15 1638 12/12/15 1654 12/12/15 1810 12/12/15 1839  BP: 109/57 144/66 118/71 (!) 133/53  Pulse: 78 80 76 (!) 117  Resp: 11  15 15   Temp:  97.4 F (36.3 C) 97.6 F (36.4 C) 98 F (36.7 C)  TempSrc:  Oral Oral Oral  SpO2:  100% 100% 100% 100%  Weight:  43.8 kg (96 lb 8 oz)    Height:  5' 3"  (1.6 m)     Eyes: PERRL, lids and conjunctivae normal ENMT: Mucous membranes are moist. Posterior pharynx clear of any exudate or lesions.Normal dentition.  Neck: normal, supple, no masses, no thyromegaly Respiratory: clear to auscultation bilaterally, no wheezing, no crackles. Normal respiratory effort. No accessory muscle use.  Cardiovascular: Regular rate and rhythm, no murmurs / rubs / gallops. No extremity edema. 2+ pedal pulses. No carotid bruits.  Abdomen: no tenderness, no masses palpated. No hepatosplenomegaly. Bowel sounds positive.  Musculoskeletal: no clubbing / cyanosis. No joint deformity upper and lower extremities. Good ROM, no contractures. Normal muscle tone.  Skin: no rashes, lesions, ulcers. No induration Neurologic: CN 2-12 grossly intact. Sensation intact, DTR normal. Strength 5/5 in all 4.  Psychiatric: Normal judgment and insight. Alert and oriented x 3. Normal mood.   Labs on Admission: I have personally reviewed following labs and imaging studies  CBC:  Recent Labs Lab 12/08/15 1033 12/12/15 1229  WBC  --  3.9*  NEUTROABS  --  2.6  HGB 9.6* 7.6*  HCT 29.7* 23.5*  MCV  --  90.0  PLT  --  010*   Basic Metabolic Panel:  Recent Labs Lab 12/12/15 1229  NA 135  K 4.0  CL 108  CO2 17*  GLUCOSE 114*  BUN 57*  CREATININE 3.58*  CALCIUM 8.6*   GFR: Estimated Creatinine Clearance: 9.8 mL/min (by C-G formula based on SCr of 3.58 mg/dL). Liver Function Tests:  Recent Labs Lab 12/12/15 1229  AST  13*  ALT 10*  ALKPHOS 93  BILITOT 0.3  PROT 6.3*  ALBUMIN 3.7   No results for input(s): LIPASE, AMYLASE in the last 168 hours. No results for input(s): AMMONIA in the last 168 hours. Coagulation Profile:  Recent Labs Lab 12/12/15 1229  INR 1.09   Cardiac Enzymes:  Recent Labs Lab 12/12/15 1229  TROPONINI <0.03   BNP (last 3 results) No results for input(s): PROBNP in the last 8760 hours. HbA1C: No results for input(s): HGBA1C in the last 72 hours. CBG:  Recent Labs Lab 12/12/15 1227  GLUCAP 116*   Lipid Profile: No results for input(s): CHOL, HDL, LDLCALC, TRIG, CHOLHDL, LDLDIRECT in the last 72 hours. Thyroid Function Tests:  Recent Labs  12/12/15 1229  TSH 1.196   Anemia Panel: No results for input(s): VITAMINB12, FOLATE, FERRITIN, TIBC, IRON, RETICCTPCT in the last 72 hours. Urine analysis:    Component Value Date/Time   COLORURINE YELLOW 12/12/2015 1320   APPEARANCEUR HAZY (A) 12/12/2015 1320   LABSPEC 1.010 12/12/2015 1320   PHURINE 6.0 12/12/2015 1320   GLUCOSEU NEGATIVE 12/12/2015 1320   HGBUR TRACE (A) 12/12/2015 1320   BILIRUBINUR NEGATIVE 12/12/2015 1320   KETONESUR NEGATIVE 12/12/2015 1320   PROTEINUR NEGATIVE 12/12/2015 1320   UROBILINOGEN 0.2 10/16/2014 2059   NITRITE NEGATIVE 12/12/2015 1320   LEUKOCYTESUR LARGE (A) 12/12/2015 1320   Sepsis Labs: @LABRCNTIP (procalcitonin:4,lacticidven:4) )No results found for this or any previous visit (from the past 240 hour(s)).   Radiological Exams on Admission: No results found.  EKG: Independently reviewed. Sinus rhythm without acute changes  Assessment/Plan Active Problems:   Anemia   Essential hypertension   Hypothyroidism   History of kidney transplant   UTI (lower urinary tract infection)   AKI (acute kidney injury) ([place])   Diabetes mellitus (H[facility])   CKD (chronic kidney disease) stage 4, GFR 15-29 ml/min ([place])  Dehydration  1. Normocytic anemia, likely related to chronic kidney  disease. Patient has been receiving Aranesp and intravenous iron as an outpatient. She appears to be symptomatic from her anemia with generalized weakness and dyspnea on exertion. She's been ordered 2 units of PRBCs. Will check stool for occult blood. She does not have any gross evidence of bleeding. Recent iron studies indicate adequate iron stores. 2. AKI on CKD stage 5. S/p kidney transplant. Likely related to volume depletion and dehydration. May also be related to adverse effect of Bactrim. Will start on IV fluids. Continue immunosuppressants. Nephrology consultation. 3. HLD. Continue statins. 4. Essential HTN. Blood pressure is currently stable. Continue metoprolol 5. GERD. COntinue PPI.  6. Hypothyroidism. Continue synthroid. 7. Diabetes. Does not appear to be on any medications. Likely diet-controlled. Will start on sliding scale insulin. 8. Urinary tract infection. Urinalysis indicates infection. Continue Rocephin. Follow-up urine culture. 9. Metabolic acidosis. Likely related to renal failure. Continue IVF and oral bicarb  DVT prophylaxis:SCD Code Status: full Family Communication: discussed with patient and daughters at the bedside Disposition Plan: discharge home once improved Consults called: Nephrology Admission status: inpatient, medsurg  Kathie Dike, MD [facility] Pager [phone]  If 7PM-7AM, please contact night-coverage [email or web address] Password [email or web address]  12/12/2015, 7:03 PM   By signing my name below, I, Delene Ruffini, attest that this documentation has been prepared under the direction and in the presence of Kathie Dike, MD. Electronically Signed: Delene Ruffini 12/12/15  I, Dr. Kathie Dike, personally performed the services described in this documentaiton. All medical record entries made by the scribe were at my direction and in my presence. I have reviewed the chart and agree that the record reflects my personal performance and is accurate and  complete  Kathie Dike, MD, 12/12/2015 7:03 PM

## 2015-12-12 NOTE — ED Provider Notes (Signed)
[place] DEPT Provider Note   CSN: [id number] Arrival date & time: 12/12/15  1152  First Provider Contact:   First MD Initiated Contact with Patient 12/12/15 1159     By signing my name below, I, Eustaquio Maize, attest that this documentation has been prepared under the direction and in the presence of Noemi Chapel, MD. Electronically Signed: Eustaquio Maize, ED Scribe. 12/12/15. 12:17 PM   History   Chief Complaint Chief Complaint  Patient presents with  . Fatigue    HPI [name] is a 75 y.o. female with PMHx CKD, HLD, HTN, and type 2 DM who presents to the Emergency Department complaining of gradual onset, constant, generalized weakness x 3 weeks. Pt reports that she called her nephrologist 3 weeks ago and told him about her symptoms. Pt has also been tracking her heart rate, blood pressure, and blood sugars and told her nephrologist about her levels. Her BP ranges between 67-619 systolic. Her heart rate between 70-100 and her blood sugars between 88-110. Her nephrologist told her to stop taking her Metoprolol which did not give patient relief from the weakness. She then followed up with her PCP, [name], about the generalized weakness, fatigue, nausea, and tremors. Pt also had RUQ abdominal pain at that time and her PCP scheduled a CT for 12/15/2015. She does not currently have abdominal pain. She mentions that she was placed on Bactrim prior to her generalized weakness beginning for a urine infection. Pt is not having any urinary issues at this time. Daughter mentions that pt has lost 7 pounds in the last week due to not having an appetite from the nausea. Pt states that she has to force food down. She drinks a protein drink everyday but does not eat much else. Pt last had her blood work checked about 1 month ago with her creatinine level being 1.6. 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 Pt has hx of 3 blood transfusions in the past but cannot say when her last one was. PSHx  appendectomy, cholecystectomy, hysterectomy, thyroidectomy. Denies chest pain, dysuria, frequency, diarrhea, hematochezia, visual changes, cough, rash, fever, or any other associated symptoms.   The history is provided by the patient and a relative. No language interpreter was used.    Past Medical History:  Diagnosis Date  . Anemia   . Arthritis    "knees" (10/16/2014)  . Carotid artery stenosis   . Chronic kidney disease    h/o transplant  . Family history of adverse reaction to anesthesia    "my daughter; they thought she was asleep and she wasn't"  . Frequent UTI   . GERD (gastroesophageal reflux disease)   . History of blood transfusion "several"   "all related to anemia"  . Hyperlipemia   . Hypertension   . Hypothyroidism   . Pneumonia "1-2 times"  . Type II diabetes mellitus [facility])     Patient Active Problem List   Diagnosis Date Noted  . AKI (acute kidney injury) ([place]) 12/12/2015  . Diabetes mellitus ([place]) 12/12/2015  . CKD (chronic kidney disease) stage 4, GFR 15-29 ml/min (H[facility]) 12/12/2015  . Dehydration 12/12/2015  . Pancreatic insufficiency ([place]) 09/28/2015  . Type 2 diabetes mellitus with stage 3 chronic kidney disease, with long-term current use of insulin ([place]) 02/23/2015  . Chest pain 10/18/2014  . Nausea with vomiting 10/18/2014  . SOB (shortness of breath) 10/18/2014  . Demand ischemia ([place]) 10/18/2014  . 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 Pain in the chest   .  Skin infection   . Carotid artery stenosis 10/17/2014  . Malnutrition of moderate degree ([place]) 10/17/2014  . Sepsis ([place]) 10/17/2014  . ARF (acute renal failure) ([place])   . Cellulitis 10/16/2014  . Cellulitis of right groin 10/16/2014  . GERD (gastroesophageal reflux disease)   . Hypothyroidism   . History of kidney transplant   . Hyperkalemia   . UTI (lower urinary tract infection)   . Atrial fibrillation ([place]) 05/25/2009  . Anemia 05/06/2009  . Essential hypertension  05/06/2009  . Hyperlipidemia 10/28/2008    Past Surgical History:  Procedure Laterality Date  . ABDOMINAL HYSTERECTOMY    . APPENDECTOMY    . BACK SURGERY    . CHOLECYSTECTOMY OPEN    . KIDNEY SURGERY     multiple  . KIDNEY TRANSPLANT Right 2004   at [facility]  . LUMBAR DISC SURGERY     "L5"  . THYROIDECTOMY     1/2 removed  . TONSILLECTOMY    . TUBAL LIGATION      OB History    No data available       Home Medications    Prior to Admission medications   Medication Sig Start Date End Date Taking? Authorizing Provider  aspirin 81 MG tablet Take 81 mg by mouth at bedtime.    Yes Historical Provider, MD  clonazePAM (KLONOPIN) 1 MG tablet Take 1 mg by mouth at bedtime.   Yes Historical Provider, MD  dicyclomine (BENTYL) 10 MG capsule Take 10 mg by mouth 3 (three) times daily before meals.    Yes Historical Provider, MD  fluconazole (DIFLUCAN) 100 MG tablet Take 100 mg by mouth daily.   Yes Historical Provider, MD  hyoscyamine (LEVSIN, ANASPAZ) 0.125 MG tablet Take 0.125 mg by mouth every 4 (four) hours as needed for cramping.    Yes Historical Provider, MD  levothyroxine (SYNTHROID, LEVOTHROID) 75 MCG tablet Take 75 mcg by mouth daily before breakfast.   Yes Historical Provider, MD  magnesium oxide (MAG-OX) 400 MG tablet Take 400 mg by mouth daily.   Yes Historical Provider, MD  metoprolol (LOPRESSOR) 50 MG tablet Take 50 mg by mouth 2 (two) times daily.   Yes Historical Provider, MD  mycophenolate (CELLCEPT) 500 MG tablet Take 500 mg by mouth 2 (two) times daily.   Yes Historical Provider, MD  pantoprazole (PROTONIX) 40 MG tablet Take 40 mg by mouth daily. 10/11/14  Yes Historical Provider, MD  sodium bicarbonate 650 MG tablet Take 1 tablet (650 mg total) by mouth 2 (two) times daily. Patient taking differently: Take 1,300 mg by mouth daily.  10/21/14  Yes Eugenie Filler, MD  tacrolimus (PROGRAF) 0.5 MG capsule Take 1 mg by mouth daily. 1 mg in the morning and 0.5 mg nightly.    Yes Historical Provider, MD  traMADol (ULTRAM) 50 MG tablet Take 50 mg by mouth every 6 (six) hours as needed for moderate pain.    Yes Historical Provider, MD  Blood Glucose Monitoring Suppl (ACCU-CHEK AVIVA PLUS) w/Device KIT Use as directed 2 x daily 06/19/15   Cassandria Anger, MD  glucose blood (ACCU-CHEK AVIVA) test strip Use as instructed bid 06/29/15   Cassandria Anger, MD    Family History Family History  Problem Relation Age of Onset  . Diabetes Mother   . Hypertension Mother   . Melanoma Mother   . Hyperlipidemia Mother   . Heart disease Father   . Varicose Veins Father   . Hypertension Sister   .  Cancer Daughter   . Hypertension Son   . Hyperlipidemia Sister   . Hyperlipidemia Daughter   . Hyperlipidemia Son   . Hypertension Daughter     Social History Social History  Substance Use Topics  . Smoking status: Never Smoker  . Smokeless tobacco: Never Used  . Alcohol use No     Allergies   Codeine; Elavil [amitriptyline]; Iodinated diagnostic agents; Lipitor [atorvastatin]; Magnesium; Metoclopramide; Morphine; Morphine and related; Neurontin [gabapentin]; Niacin and related; Norvasc [amlodipine besylate]; Talwin [pentazocine]; and Ultram [tramadol]   Review of Systems Review of Systems  Constitutional: Positive for fatigue. Negative for fever.  Eyes: Negative for visual disturbance.  Respiratory: Negative for cough.   Gastrointestinal: Positive for nausea. Negative for blood in stool and vomiting.  Genitourinary: Negative for dysuria and frequency.  Skin: Negative for rash.  Neurological: Positive for weakness (generalized).  All other systems reviewed and are negative.    Physical Exam Updated Vital Signs BP 117/58   Pulse 75   Resp 12   Ht _0  (1.6 m)   Wt 91 lb (41.3 kg)   SpO2 100%   BMI 16.12 kg/m   Physical Exam  Constitutional: She appears well-developed and well-nourished. No distress.  HENT:  Head: Normocephalic and atraumatic.    Mouth/Throat: Oropharynx is clear and moist. No oropharyngeal exudate.  Eyes: Conjunctivae and EOM are normal. Pupils are equal, round, and reactive to light. Right eye exhibits no discharge. Left eye exhibits no discharge. No scleral icterus.  Neck: Normal range of motion. Neck supple. No JVD present. No thyromegaly present.  Cardiovascular: Normal rate, regular rhythm, normal heart sounds and intact distal pulses.  Exam reveals no gallop and no friction rub.   No murmur heard. Pulmonary/Chest: Effort normal and breath sounds normal. No respiratory distress. She has no wheezes. She has no rales.  Abdominal: Soft. Bowel sounds are normal. She exhibits no distension and no mass. There is no tenderness.  Musculoskeletal: Normal range of motion. She exhibits no edema or tenderness.  Lymphadenopathy:    She has no cervical adenopathy.  Neurological: She is alert. Coordination normal.  Bilateral tremor but no dysmetria.  No asterixis.  Cn II-XII are normal.   Skin: Skin is warm and dry. No rash noted. No erythema.  Psychiatric: She has a normal mood and affect. Her behavior is normal.  Nursing note and vitals reviewed.    ED Treatments / Results   DIAGNOSTIC STUDIES: Oxygen Saturation is 100% on RA, normal by my interpretation.    COORDINATION OF CARE: 12:14 PM-Discussed treatment plan which includes CXR, UA, TSH, CMP with pt at bedside and pt agreed to plan.    Labs (all labs ordered are listed, but only abnormal results are displayed) Labs Reviewed  COMPREHENSIVE METABOLIC PANEL - Abnormal; Notable for the following:       Result Value   CO2 17 (*)    Glucose, Bld 114 (*)    BUN 57 (*)    Creatinine, Ser 3.58 (*)    Calcium 8.6 (*)    Total Protein 6.3 (*)    AST 13 (*)    ALT 10 (*)    GFR calc non Af Amer 12 (*)    GFR calc Af Amer 14 (*)    All other components within normal limits  CBC WITH DIFFERENTIAL/PLATELET - Abnormal; Notable for the following:    WBC 3.9 (*)     RBC 2.61 (*)    Hemoglobin 7.6 (*)  HCT 23.5 (*)    RDW 17.1 (*)    Platelets 109 (*)    Lymphs Abs 0.6 (*)    All other components within normal limits  URINALYSIS, ROUTINE W REFLEX MICROSCOPIC (NOT AT [facility]) - Abnormal; Notable for the following:    APPearance HAZY (*)    Hgb urine dipstick TRACE (*)    Leukocytes, UA LARGE (*)    All other components within normal limits  URINE MICROSCOPIC-ADD ON - Abnormal; Notable for the following:    Squamous Epithelial / LPF 0-5 (*)    Bacteria, UA FEW (*)    All other components within normal limits  CBG MONITORING, ED - Abnormal; Notable for the following:    Glucose-Capillary 116 (*)    All other components within normal limits  URINE CULTURE  TROPONIN I  LACTIC ACID, PLASMA  PROTIME-INR  TSH  LACTIC ACID, PLASMA  TYPE AND SCREEN  PREPARE RBC (CROSSMATCH)    EKG  EKG Interpretation None       Radiology No results found.  Procedures Procedures (including critical care time)  Medications Ordered in ED Medications  cefTRIAXone (ROCEPHIN) 1 g in dextrose 5 % 50 mL IVPB (1 g Intravenous New Bag/Given 12/12/15 1427)  0.9 %  sodium chloride infusion (not administered)  sodium chloride 0.9 % bolus 500 mL (0 mLs Intravenous Stopped 12/12/15 1331)  ondansetron (ZOFRAN-ODT) disintegrating tablet 4 mg (4 mg Oral Given 12/12/15 1241)  sodium chloride 0.9 % bolus 1,000 mL (1,000 mLs Intravenous New Bag/Given 12/12/15 1428)     Initial Impression / Assessment and Plan / ED Course  I have reviewed the triage vital signs and the nursing notes.  Pertinent labs & imaging results that were available during my care of the patient were reviewed by me and considered in my medical decision making (see chart for details).  Clinical Course  Comment By Time  The patient has multiple laboratory abnormalities including a urinary tract infection with large leukocyte esterase, too numerous to count white blood cells and bacteria, her urine  culture is pending, troponin is normal, she is severely anemic with a hemoglobin of 7.6 and will likely require a repeat transfusion. Her white blood cell count is low in her renal function is poor with a creatinine of 3.58 approximately twice what it has been in the past. Her generalized weakness is likely result of her acute on chronic renal failure and the urinary tract infection. She has not been hypotensive, tachycardic or febrile. Noemi Chapel, MD 07/29 1415  The patient was informed of all of her results, I have paged the hospitalist for admission to the hospital. Noemi Chapel, MD 07/29 1416   Discussed with the hospitalist at 2:30 PM, agreeable to admission Blood ordered for transfusion Fluids [employer] orders written  I personally performed the services described in this documentation, which was scribed in my presence. The recorded information has been reviewed and is accurate.      Final Clinical Impressions(s) / ED Diagnoses   Final diagnoses:  Acute renal failure, unspecified acute renal failure type ([place])  UTI (lower urinary tract infection)  Anemia, unspecified anemia type    New Prescriptions New Prescriptions   No medications on file     Noemi Chapel, MD 12/12/15 1435

## 2015-12-13 LAB — TYPE AND SCREEN
ABO/RH(D): O POS
ANTIBODY SCREEN: NEGATIVE
Unit division: 0
Unit division: 0

## 2015-12-13 LAB — COMPREHENSIVE METABOLIC PANEL
ALBUMIN: 3.2 g/dL — AB (ref 3.5–5.0)
ALT: 10 U/L — ABNORMAL LOW (ref 14–54)
AST: 12 U/L — AB (ref 15–41)
Alkaline Phosphatase: 81 U/L (ref 38–126)
Anion gap: 8 (ref 5–15)
BUN: 43 mg/dL — AB (ref 6–20)
CHLORIDE: 117 mmol/L — AB (ref 101–111)
CO2: 18 mmol/L — ABNORMAL LOW (ref 22–32)
Calcium: 8.6 mg/dL — ABNORMAL LOW (ref 8.9–10.3)
Creatinine, Ser: 2.8 mg/dL — ABNORMAL HIGH (ref 0.44–1.00)
GFR calc Af Amer: 18 mL/min — ABNORMAL LOW (ref >60)
GFR, EST NON AFRICAN AMERICAN: 16 mL/min — AB (ref >60)
Glucose, Bld: 81 mg/dL (ref 65–99)
POTASSIUM: 4.3 mmol/L (ref 3.5–5.1)
Sodium: 143 mmol/L (ref 135–145)
Total Bilirubin: 0.4 mg/dL (ref 0.3–1.2)
Total Protein: 5.5 g/dL — ABNORMAL LOW (ref 6.5–8.1)

## 2015-12-13 LAB — CBC
HEMATOCRIT: 29.8 % — AB (ref 36.0–46.0)
Hemoglobin: 10.2 g/dL — ABNORMAL LOW (ref 12.0–15.0)
MCH: 29.7 pg (ref 26.0–34.0)
MCHC: 34.2 g/dL (ref 30.0–36.0)
MCV: 86.9 fL (ref 78.0–100.0)
Platelets: 107 10*3/uL — ABNORMAL LOW (ref 150–400)
RBC: 3.43 MIL/uL — ABNORMAL LOW (ref 3.87–5.11)
RDW: 16.9 % — AB (ref 11.5–15.5)
WBC: 3 10*3/uL — AB (ref 4.0–10.5)

## 2015-12-13 LAB — GLUCOSE, CAPILLARY
GLUCOSE-CAPILLARY: 95 mg/dL (ref 65–99)
Glucose-Capillary: 88 mg/dL (ref 65–99)
Glucose-Capillary: 98 mg/dL (ref 65–99)
Glucose-Capillary: 102 mg/dL — ABNORMAL HIGH (ref 65–99)

## 2015-12-13 MED ORDER — SODIUM CHLORIDE 0.45 % IV SOLN
INTRAVENOUS | Status: DC
  Administered 2015-12-13: 09:00:00 via INTRAVENOUS

## 2015-12-13 NOTE — Progress Notes (Signed)
PROGRESS NOTE    [name]  WUJ:[id number] DOB: 21-Oct-1940 DOA: 12/12/2015 PCP: Samuel Jester, DO Outpatient Specialists:    Brief Narrative:  25 yof with a hx of CKD stage 4 s/p renal transplant 13 years ago, DM type 2, and HLD presented with complaints of gradual onset of constant, generalized weakness for the past 10 days. While being evaluated in the ED, patient was found to have an elevated creatinine above her baseline. She was also noted to have a declining hemoglobin and thrombocytopenia. She was started on IV fluids and referred for admission.   Assessment & Plan:   Active Problems:   Anemia   Essential hypertension   Hypothyroidism   History of kidney transplant   UTI (lower urinary tract infection)   AKI (acute kidney injury) ([facility])   Diabetes mellitus ([facility])   CKD (chronic kidney disease) stage 4, GFR 15-29 ml/min ([facility])   Dehydration  1. Normocytic anemia, likely related to chronic kidney disease. Patient has been receiving Aranesp and intravenous iron as an outpatient. She appeared to be symptomatic from her anemia with generalized weakness and dyspnea on exertion. Improving s/p 2 units transfusion pRBCs. Will check stool for occult blood. She does not have any gross evidence of bleeding. Recent iron studies indicate adequate iron stores. 2. AKI on CKD stage 5. S/p kidney transplant. Likely related to volume depletion and dehydration. May also be related to adverse effect of Bactrim. Improving with current treatments and fluids. Continue immunosuppressants.  3. HLD. Continue statins. 4. Essential HTN. Blood pressure is currently stable. Continue metoprolol 5. GERD. Continue PPI.  6. Hypothyroidism. Continue synthroid. 7. Diabetes mellitus. Does not appear to be on any medications. Likely diet-controlled. Continue sliding scale insulin. 8. Urinary tract infection. Urinalysis indicates infection. Continue Rocephin. Follow-up urine culture. 9. Metabolic acidosis.  Likely related to renal failure. Continue IVF and oral bicarb  DVT prophylaxis:SCD Code Status: full Family Communication: discussed with patient. No family present Disposition Plan: discharge home once improved   Consultants:   Nephrology  Procedures:   2 units pRBCs transfusion 7/29  Antimicrobials:   Rocephin 7/29 >>    Subjective: Feels improved today. Has more energy. Is eating and drinking adequately. Nausea has improved. Has not had a bowel movement since arriving.  Objective: Vitals:   12/12/15 1839 12/12/15 1904 12/12/15 2119 12/13/15 0611  BP: (!) 133/53 121/64 135/66 123/62  Pulse: (!) 117 86 72 71  Resp: 15 15 16 20   Temp: 98 F (36.7 C) 97.3 F (36.3 C) 98.6 F (37 C) 98 F (36.7 C)  TempSrc: Oral Oral Oral Oral  SpO2: 100% 100% 100% 100%  Weight:      Height:        Intake/Output Summary (Last 24 hours) at 12/13/15 1001 Last data filed at 12/12/15 2119  Gross per 24 hour  Intake           1238.2 ml  Output                0 ml  Net           1238.2 ml   Filed Weights   12/12/15 1200 12/12/15 1654  Weight: 41.3 kg (91 lb) 43.8 kg (96 lb 8 oz)    Examination:  General exam: Appears calm and comfortable  Respiratory system: Clear to auscultation. Respiratory effort normal. Cardiovascular system: S1 & S2 heard, RRR. No JVD, murmurs, rubs, gallops or clicks. No pedal edema. Gastrointestinal system: Abdomen is nondistended, soft and  nontender. No organomegaly or masses felt. Normal bowel sounds heard. Central nervous system: Alert and oriented. No focal neurological deficits. Extremities: Symmetric 5 x 5 power. Skin: No rashes, lesions or ulcers Psychiatry: Judgement and insight appear normal. Mood & affect appropriate.   Data Reviewed: I have personally reviewed following labs and imaging studies  CBC:  Recent Labs Lab 12/08/15 1033 12/12/15 1229 12/13/15 0544  WBC  --  3.9* 3.0*  NEUTROABS  --  2.6  --   HGB 9.6* 7.6* 10.2*  HCT  29.7* 23.5* 29.8*  MCV  --  90.0 86.9  PLT  --  109* 107*   Basic Metabolic Panel:  Recent Labs Lab 12/12/15 1229 12/13/15 0544  NA 135 143  K 4.0 4.3  CL 108 117*  CO2 17* 18*  GLUCOSE 114* 81  BUN 57* 43*  CREATININE 3.58* 2.80*  CALCIUM 8.6* 8.6*   GFR: Estimated Creatinine Clearance: 12.6 mL/min (by C-G formula based on SCr of 2.8 mg/dL). Liver Function Tests:  Recent Labs Lab 12/12/15 1229 12/13/15 0544  AST 13* 12*  ALT 10* 10*  ALKPHOS 93 81  BILITOT 0.3 0.4  PROT 6.3* 5.5*  ALBUMIN 3.7 3.2*   Coagulation Profile:  Recent Labs Lab 12/12/15 1229  INR 1.09   Cardiac Enzymes:  Recent Labs Lab 12/12/15 1229  TROPONINI <0.03   CBG:  Recent Labs Lab 12/12/15 1227 12/12/15 2040 12/13/15 0730  GLUCAP 116* 122* 88   Thyroid Function Tests:  Recent Labs  12/12/15 1229  TSH 1.196   Anemia Panel: No results for input(s): VITAMINB12, FOLATE, FERRITIN, TIBC, IRON, RETICCTPCT in the last 72 hours. Urine analysis:    Component Value Date/Time   COLORURINE YELLOW 12/12/2015 1320   APPEARANCEUR HAZY (A) 12/12/2015 1320   LABSPEC 1.010 12/12/2015 1320   PHURINE 6.0 12/12/2015 1320   GLUCOSEU NEGATIVE 12/12/2015 1320   HGBUR TRACE (A) 12/12/2015 1320   BILIRUBINUR NEGATIVE 12/12/2015 1320   KETONESUR NEGATIVE 12/12/2015 1320   PROTEINUR NEGATIVE 12/12/2015 1320   UROBILINOGEN 0.2 10/16/2014 2059   NITRITE NEGATIVE 12/12/2015 1320   LEUKOCYTESUR LARGE (A) 12/12/2015 1320   Radiology Studies: Portable Chest 1 View  Result Date: 12/12/2015 CLINICAL DATA:  Generalized weakness. EXAM: PORTABLE CHEST 1 VIEW COMPARISON:  Acute abdominal series 10/18/2014 FINDINGS: The heart is mildly enlarged. Atherosclerotic calcifications are present at the aortic arch. Left upper lobe nodule is stable. Scarring at the right apex is stable. No focal airspace disease present. There is no edema or effusion. The visualized soft tissues and bony thorax are unremarkable.  IMPRESSION: 1. Borderline cardiomegaly without failure. 2. Several left upper lobe nodule and right upper lobe scarring likely reflecting granulomatous disease. Electronically Signed   By: Marin Roberts M.D.   On: 12/12/2015 19:51   Scheduled Meds: . aspirin  81 mg Oral QHS  . cefTRIAXone (ROCEPHIN)  IV  1 g Intravenous Q24H  . clonazePAM  1 mg Oral QHS  . dicyclomine  10 mg Oral TID AC  . fluconazole  100 mg Oral Daily  . insulin aspart  0-15 Units Subcutaneous TID WC  . insulin aspart  0-5 Units Subcutaneous QHS  . levothyroxine  75 mcg Oral QAC breakfast  . magnesium oxide  400 mg Oral Daily  . metoprolol  50 mg Oral BID  . mycophenolate  500 mg Oral BID  . pantoprazole  40 mg Oral Daily  . sodium bicarbonate  1,300 mg Oral Daily  . tacrolimus  0.5 mg Oral  QHS  . tacrolimus  1 mg Oral Daily   Continuous Infusions: . sodium chloride 100 mL/hr at 12/13/15 0909     LOS: 1 day    Time spent: 25 minutes  Erick Blinks, MD [facility] Pager [phone]  If 7PM-7AM, please contact night-coverage [email or web address] Password [email or web address] 12/13/2015, 10:01 AM   By signing my name below, I, Adron Bene, attest that this documentation has been prepared under the direction and in the presence of Erick Blinks, MD. Electronically Signed: Adron Bene 12/13/15 1:35am  I, Dr. Erick Blinks, personally performed the services described in this documentaiton. All medical record entries made by the scribe were at my direction and in my presence. I have reviewed the chart and agree that the record reflects my personal performance and is accurate and complete  Erick Blinks, MD, 12/13/2015 1:47 PM

## 2015-12-14 DIAGNOSIS — D649 Anemia, unspecified: Secondary | ICD-10-CM

## 2015-12-14 DIAGNOSIS — E039 Hypothyroidism, unspecified: Secondary | ICD-10-CM

## 2015-12-14 LAB — URINE CULTURE

## 2015-12-14 LAB — BASIC METABOLIC PANEL
Anion gap: 8 (ref 5–15)
BUN: 34 mg/dL — AB (ref 6–20)
CALCIUM: 8.7 mg/dL — AB (ref 8.9–10.3)
CO2: 21 mmol/L — AB (ref 22–32)
Chloride: 112 mmol/L — ABNORMAL HIGH (ref 101–111)
Creatinine, Ser: 2.18 mg/dL — ABNORMAL HIGH (ref 0.44–1.00)
GFR calc Af Amer: 25 mL/min — ABNORMAL LOW (ref >60)
GFR, EST NON AFRICAN AMERICAN: 21 mL/min — AB (ref >60)
GLUCOSE: 84 mg/dL (ref 65–99)
Potassium: 4.1 mmol/L (ref 3.5–5.1)
Sodium: 141 mmol/L (ref 135–145)

## 2015-12-14 LAB — CBC
HEMATOCRIT: 30.7 % — AB (ref 36.0–46.0)
Hemoglobin: 10.2 g/dL — ABNORMAL LOW (ref 12.0–15.0)
MCH: 29.3 pg (ref 26.0–34.0)
MCHC: 33.2 g/dL (ref 30.0–36.0)
MCV: 88.2 fL (ref 78.0–100.0)
Platelets: 125 10*3/uL — ABNORMAL LOW (ref 150–400)
RBC: 3.48 MIL/uL — ABNORMAL LOW (ref 3.87–5.11)
RDW: 17.1 % — AB (ref 11.5–15.5)
WBC: 2.7 10*3/uL — ABNORMAL LOW (ref 4.0–10.5)

## 2015-12-14 LAB — GLUCOSE, CAPILLARY
GLUCOSE-CAPILLARY: 86 mg/dL (ref 65–99)
GLUCOSE-CAPILLARY: 120 mg/dL — AB (ref 65–99)

## 2015-12-14 NOTE — Care Management Important Message (Signed)
Important Message  Patient Details  Name: [name] MRN: [id number] Date of Birth: 04-07-1941   Medicare Important Message Given:  Yes    Malcolm Metro, RN 12/14/2015, 11:39 AM

## 2015-12-14 NOTE — Discharge Summary (Addendum)
Physician Discharge Summary  [name] XHB:[id number] DOB: 1940/11/14 DOA: 12/12/2015  PCP: [name], DO  Admit date: 12/12/2015 Discharge date: 12/23/2015  Admitted From: Home  Disposition:  Home   Recommendations for Outpatient Follow-up:  1. Follow up with PCP in 1-2 weeks 2. Please obtain BMP/CBC in one week 3. 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 While being evaluated in the ED, patient was found to have an elevated creatinine above her baseline. She was also noted to have a declining hemoglobin and thrombocytopenia. She was started on IV fluids and referred for admission.    Discharge Diagnoses:  Active Problems:   Anemia   Essential hypertension   Hypothyroidism   History of kidney transplant   UTI (lower urinary tract infection)   AKI (acute kidney injury) ([facility])   Diabetes mellitus (HCC)   CKD (chronic kidney disease) stage 4, GFR 15-29 ml/min (HCC)   Dehydration   Moderate malnutrition  Pt presented with complaints of gradual onset of constant, generalized weakness found to be due to normocytic anemia, likely related to chronic kidney disease. During evaluation she appearedto be symptomatic from her anemia with generalized weakness and dyspnea on exertion. Pt was transfused 2 units pRBCs. On discharge she was noted to have improved and hemoglobin was stable. She did not have any significant bowel movements during hospitalization. Making GI bleed unlikely.  1. AKI on CKD stage 5. S/p kidney transplant.Likely related to volume depletion and dehydration. May also be related to adverse effect of Bactrim.  Continues to improve with current treatments and fluids. Continue immunosuppressants.  2. HLD. Continue statins. 3. Essential HTN. Blood pressure remains stable. Continue metoprolol 4. GERD. Continue PPI.  5. Hypothyroidism. Continue synthroid. 6. Diabetes mellitus. Does not appear to be on any medications. Likely diet-controlled.  7. Urinary tract infection. Urinalysis indicates infection. Urinalysis indicates posible infection. Urine cultures showed nonspecific growth no plans on further abx.  8. Metabolic acidosis. Likely related to renal failure. Improved with IVF and oral bicarb  Discharge Instructions  Discharge Instructions    Diet - low sodium heart healthy    Complete by:  As directed   Increase activity slowly    Complete by:  As directed       Medication List    TAKE these medications   ACCU-CHEK AVIVA PLUS w/Device Kit Use as directed 2 x daily   aspirin 81 MG tablet Take 81 mg by mouth at bedtime.   clonazePAM 1 MG tablet Commonly known as:  KLONOPIN Take 1 mg by mouth at bedtime.   dicyclomine 10 MG capsule Commonly known as:  BENTYL Take 10 mg by mouth 3 (three) times daily before meals.   fluconazole 100 MG tablet Commonly known as:  DIFLUCAN Take 100 mg by mouth daily.   glucose blood test strip Commonly known as:  ACCU-CHEK AVIVA Use as instructed bid   hyoscyamine 0.125 MG tablet Commonly known as:  LEVSIN, ANASPAZ Take 0.125 mg by mouth every 4 (four) hours as needed for cramping.   levothyroxine 75 MCG tablet Commonly known as:  SYNTHROID, LEVOTHROID Take 75 mcg by mouth daily before breakfast.   magnesium  oxide 400 MG tablet Commonly known as:  MAG-OX Take 400 mg by mouth daily.   metoprolol 50 MG tablet Commonly known as:  LOPRESSOR Take 50 mg by mouth 2 (two) times daily.   mycophenolate 500 MG tablet Commonly known as:  CELLCEPT Take 250 mg by mouth 2 (two) times daily.   pantoprazole 40 MG tablet Commonly known as:   PROTONIX Take 40 mg by mouth daily.   sodium bicarbonate 650 MG tablet Take 1 tablet (650 mg total) by mouth 2 (two) times daily. What changed:  how much to take  when to take this   tacrolimus 0.5 MG capsule Commonly known as:  PROGRAF Take 1 mg by mouth daily. 1 mg in the morning and 0.5 mg nightly.   traMADol 50 MG tablet Commonly known as:  ULTRAM Take 50 mg by mouth every 6 (six) hours as needed for moderate pain.       Allergies  Allergen Reactions  . Codeine Nausea And Vomiting  . Elavil [Amitriptyline] Other (See Comments)    unknown  . Iodinated Diagnostic Agents      Renal transplant pt  . Lipitor [Atorvastatin]   . Magnesium Hives    Pt tolerated oral and IV during 06/2014 admission   . Metoclopramide     Other reaction(s): Other (See Comments) "doesn't work"  . Morphine     Other reaction(s): Confusion (intolerance)  . Morphine And Related   . Neurontin [Gabapentin]   . Niacin And Related   . Norvasc [Amlodipine Besylate]   . Talwin [Pentazocine]   . Ultram [Tramadol]     Consultations:  Nephrology    Procedures/Studies: Ct Abdomen Pelvis Wo Contrast  Result Date: 12/15/2015 CLINICAL DATA:  Right upper quadrant abdominal pain for 1 year. Nausea and early satiety. EXAM: CT ABDOMEN AND PELVIS WITHOUT CONTRAST TECHNIQUE: Multidetector CT imaging of the abdomen and pelvis was performed following the standard protocol without IV contrast. COMPARISON:  CT abdomen pelvis - 07/20/2010 FINDINGS: The lack of intravenous contrast limits the ability to evaluate solid abdominal organs. Lower chest: Limited visualization of the lower thorax is negative for focal airspace opacity or pleural effusion. Normal heart size. Coronary artery calcifications. No pericardial effusion. Hepatobiliary: Normal hepatic contour. Post cholecystectomy. No ascites. Pancreas: Normal noncontrast appearance of the pancreas Spleen: Normal noncontrast appearance of the spleen  Adrenals/Urinary Tract: Re- demonstrated marked atrophy of the bilateral native kidneys with right lower quadrant renal transplant. Grossly unchanged appearance of an approximately 3.6 cm exophytic hypo attenuating (16 Hounsfield unit) lesion arising from the inferior pole the right kidney (coronal image 57, series 4) which is incompletely characterized on this noncontrast examination though morphologically similar to the 2012 examination and favored to represent a renal cyst. Embolization coils are again seen within the inferior pole the right lower quadrant transplant kidney. No evidence of urinary obstruction. Normal noncontrast appearance of the bilateral adrenal glands. Normal noncontrast appearance of the urinary bladder. Stomach/Bowel: Normal noncontrast appearance of the terminal ileum and appendix. No pneumoperitoneum, pneumatosis or portal venous gas. Vascular/Lymphatic: Minimal amount of atherosclerotic plaque with a normal caliber abdominal aorta. No bulky retroperitoneal, mesenteric, pelvic or inguinal lymphadenopathy. Reproductive: Post hysterectomy.  No discrete adnexal lesions. Other: Regional soft tissues appear normal. Musculoskeletal: No acute or aggressive osseous abnormalities. Mild-to-moderate multilevel lumbar spine DDD, worse at L5-S1 with disc space height loss, endplate irregularity and sclerosis. Mild presumably degenerative scoliotic curvature of the thoracolumbar spine with dominant caudal component convex to the right. IMPRESSION: 1. No  explanation for patient's chronic right upper quadrant abdominal pain. Specifically, no evidence of enteric obstruction. Normal noncontrast appearance of the appendix. 2. Stable sequela of right lower quadrant renal transplant with embolization coils within the inferior pole of the transplanted kidney. No evidence of urinary obstruction. 3. Post cholecystectomy. 4. Coronary artery calcifications. Aortic Atherosclerosis (ICD10-170.0) Electronically Signed    By: Sandi Mariscal M.D.   On: 12/15/2015 20:04   Portable Chest 1 View  Result Date: 12/12/2015 CLINICAL DATA:  Generalized weakness. EXAM: PORTABLE CHEST 1 VIEW COMPARISON:  Acute abdominal series 10/18/2014 FINDINGS: The heart is mildly enlarged. Atherosclerotic calcifications are present at the aortic arch. Left upper lobe nodule is stable. Scarring at the right apex is stable. No focal airspace disease present. There is no edema or effusion. The visualized soft tissues and bony thorax are unremarkable. IMPRESSION: 1. Borderline cardiomegaly without failure. 2. Several left upper lobe nodule and right upper lobe scarring likely reflecting granulomatous disease. Electronically Signed   By: San Morelle M.D.   On: 12/12/2015 19:51  2 units pRBCs transfusion 7/29  Subjective: Feels well. Breathing has improved. Food intake is well.  Discharge Exam: Vitals:   12/13/15 2158 12/14/15 0548  BP: 123/62 138/76  Pulse: 69 69  Resp: 16 15  Temp: 98.3 F (36.8 C) 98.1 F (36.7 C)   Vitals:   12/13/15 0611 12/13/15 1455 12/13/15 2158 12/14/15 0548  BP: 123/62 127/76 123/62 138/76  Pulse: 71 (!) 56 69 69  Resp: _0 Temp: 98 F (36.7 C) 98 F (36.7 C) 98.3 F (36.8 C) 98.1 F (36.7 C)  TempSrc: Oral  Oral Oral  SpO2: 100% 100% 96% 98%  Weight:      Height:        General: Pt is alert, awake, not in acute distress Cardiovascular: RRR, S1/S2 +, no rubs, no gallops Respiratory: CTA bilaterally, no wheezing, no rhonchi Abdominal: Soft, NT, ND, bowel sounds + Extremities: no edema, no cyanosis    The results of significant diagnostics from this hospitalization (including imaging, microbiology, ancillary and laboratory) are listed below for reference.     Microbiology: No results found for this or any previous visit (from the past 240 hour(s)).   Labs: BNP (last 3 results) No results for input(s): BNP in the last 8760 hours. Basic Metabolic Panel: No results for  input(s): NA, K, CL, CO2, GLUCOSE, BUN, CREATININE, CALCIUM, MG, PHOS in the last 168 hours. Liver Function Tests: No results for input(s): AST, ALT, ALKPHOS, BILITOT, PROT, ALBUMIN in the last 168 hours. No results for input(s): LIPASE, AMYLASE in the last 168 hours. No results for input(s): AMMONIA in the last 168 hours. CBC: No results for input(s): WBC, NEUTROABS, HGB, HCT, MCV, PLT in the last 168 hours. Cardiac Enzymes: No results for input(s): CKTOTAL, CKMB, CKMBINDEX, TROPONINI in the last 168 hours. BNP: Invalid input(s): POCBNP CBG: No results for input(s): GLUCAP in the last 168 hours. D-Dimer No results for input(s): DDIMER in the last 72 hours. Hgb A1c No results for input(s): HGBA1C in the last 72 hours. Lipid Profile No results for input(s): CHOL, HDL, LDLCALC, TRIG, CHOLHDL, LDLDIRECT in the last 72 hours. Thyroid function studies No results for input(s): TSH, T4TOTAL, T3FREE, THYROIDAB in the last 72 hours.  Invalid input(s): FREET3 Anemia work up No results for input(s): VITAMINB12, FOLATE, FERRITIN, TIBC, IRON, RETICCTPCT in the last 72 hours. Urinalysis    Component Value Date/Time   COLORURINE YELLOW 12/12/2015 1320  APPEARANCEUR HAZY (A) 12/12/2015 1320   LABSPEC 1.010 12/12/2015 1320   PHURINE 6.0 12/12/2015 1320   GLUCOSEU NEGATIVE 12/12/2015 1320   HGBUR TRACE (A) 12/12/2015 1320   BILIRUBINUR NEGATIVE 12/12/2015 1320   KETONESUR NEGATIVE 12/12/2015 1320   PROTEINUR NEGATIVE 12/12/2015 1320   UROBILINOGEN 0.2 10/16/2014 2059   NITRITE NEGATIVE 12/12/2015 1320   LEUKOCYTESUR LARGE (A) 12/12/2015 1320   Sepsis Labs Invalid input(s): PROCALCITONIN,  WBC,  LACTICIDVEN Microbiology No results found for this or any previous visit (from the past 240 hour(s)).   Time coordinating discharge: Over 30 minutes  SIGNED:   Kathie Dike, MD [facility] 12/23/2015, 8:26 AM If 7PM-7AM, please contact night-coverage [email or web address] Password  [email or web address]  By signing my name below, I, Collene Leyden, attest that this documentation has been prepared under the direction and in the presence of Kathie Dike, MD. Electronically signed: Collene Leyden, Scribe. 12/23/15 12:50 PM   I, Dr. Kathie Dike, personally performed the services described in this documentaiton. All medical record entries made by the scribe were at my direction and in my presence. I have reviewed the chart and agree that the record reflects my personal performance and is accurate and complete  Kathie Dike, MD, 12/23/2015 8:26 AM

## 2015-12-14 NOTE — Progress Notes (Signed)
Initial Nutrition Assessment  DOCUMENTATION CODES:   Non-severe (moderate) malnutrition in context of chronic illness, Underweight  INTERVENTION:  Provided tips for her to increase her daily caloric intake in order to improve her weight status and prevent worsening of malnutrition  Continue [employer] Breakfast at home daily  NUTRITION DIAGNOSIS:   Malnutrition related to chronic illness (kidney disease as evidenced by moderate depletions of muscle mass, moderate depletion of body fat.   GOAL:   Patient will meet greater than or equal to 90% of their needs, Weight gain  MONITOR:   PO intake, Supplement acceptance, Weight trends, Labs  REASON FOR ASSESSMENT:   Malnutrition Screening Tool    ASSESSMENT:  [name] is a 75 y.o. 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 She has a hx of moderate malnutrition and is underweight. According to her weight hx usual body weight has fluctuated between 96-99# (44-45 kg) for the past 16 months. Patient was assessed by RD on 10/17/14 and her weight at that time 44 kg. Pt says her weight was 91# (41 kg) on admission which has improved significantly with blood volume repletion.  Patient is setting up in bed and says she's feeling so much better. She ate 50% of her dinner last night and breakfast this morning. Independent with feeding. She usually eats 2-3 meals daily and prepares her own food. She also has a habit of drinking [employer] daily in the morning. Nursing reports she may be discharged later today.   Recent Labs Lab 12/12/15 1229 12/13/15 0544 12/14/15 0522  NA 135 143 141  K 4.0 4.3 4.1  CL 108 117* 112*  CO2 17* 18* 21*  BUN 57* 43* 34*  CREATININE 3.58* 2.80* 2.18*  CALCIUM 8.6* 8.6* 8.7*  GLUCOSE 114* 81 84   Labs and  medications reviewed.  Nutrition-Focused physical exam completed. Findings are moderte fat depletion, moderate muscle depletion, and no edema.     Diet Order:  Diet heart healthy/carb modified Room service appropriate? Yes; Fluid consistency: Thin  Skin:   intact, non-tenting  Last BM:   7/29  Height:   Ht Readings from Last 1 Encounters:  12/12/15 5\' 3"  (1.6 m)    Weight:   Wt Readings from Last 1 Encounters:  12/12/15 96 lb 8 oz (43.8 kg)    Ideal Body Weight:  52.2 kg  BMI:  Body mass index is 17.09 kg/m.  Estimated Nutritional Needs:   Kcal:  4599-7741  Protein:  38-42 gr  Fluid:  1.3 liters daily  EDUCATION NEEDS:   Education needs addressed  Royann Shivers MS,RD,CSG,LDN Office: [phone] Pager: [phone]

## 2015-12-14 NOTE — Evaluation (Signed)
Physical Therapy Evaluation Patient Details Name: [name] MRN: [id number] DOB: 17-Apr-1941 Today's Date: 12/14/2015   History of Present Illness  75 yo F admitted 12/12/2015 due to fatigue s/p fall with UTI.  Dx: symptomatic anemia.  Today (12/13/2015) Hgb: 10.2, and Hct: 29.8. PMH: CKD stage 4 with a renal transplant 13 years ago, DM2, HTN,  anemia, arthritis, CAD, GERD, hypothyroidism, PNA, appendectomy, back surgery, open cholecystectomy, thyroidectomy with  removed.  Clinical Impression  Pt received sitting up in the bed, dtr present, and pt is agreeable to PT evaluation.  Pt states that she normally lives alone and is independent with ADL's.  During today's PT evaluation she was able to ambulate 423ft, but required Min guard due to unsteadiness with guarded gait pattern.  Pt also demonstrated decreased gait speed.  At this point, recommend d/c home with OPPT for further balance and strength training.     Follow Up Recommendations Outpatient PT    Equipment Recommendations       Recommendations for Other Services       Precautions / Restrictions Precautions Precautions: Fall Restrictions Weight Bearing Restrictions: No      Mobility  Bed Mobility Overal bed mobility: Needs Assistance Bed Mobility: Supine to Sit     Supine to sit: Supervision        Transfers Overall transfer level: Needs assistance Equipment used: None Transfers: Sit to/from Stand;Stand Pivot Transfers Sit to Stand: Supervision Stand pivot transfers: Supervision;Min guard          Ambulation/Gait Ambulation/Gait assistance: Min guard Ambulation Distance (Feet): 400 Feet Assistive device: None Gait Pattern/deviations: Ataxic;Narrow base of support;Decreased step length - left;Decreased stance time - right   Gait velocity interpretation: <1.8 ft/sec, indicative of risk for recurrent falls General Gait Details: Guarded gait pattern with decreased B UE swing.    Stairs             Wheelchair Mobility    Modified Rankin (Stroke Patients Only)       Balance Overall balance assessment: History of Falls;Needs assistance         Standing balance support: No upper extremity supported Standing balance-Leahy Scale: Fair Standing balance comment: Requires Min guard for gait                 Standardized Balance Assessment Standardized Balance Assessment : Berg Balance Test           Pertinent Vitals/Pain Pain Assessment: No/denies pain    Home Living   Living Arrangements: Alone Available Help at Discharge: Family;Available PRN/intermittently Type of Home: Apartment (handicapp) Home Access: Stairs to enter   Entrance Stairs-Number of Steps: 1 curb and 1 step in the door.  Home Layout: One level Home Equipment: Grab bars - tub/shower;Grab bars - toilet      Prior Function Level of Independence: Independent               Hand Dominance   Dominant Hand: Right    Extremity/Trunk Assessment   Upper Extremity Assessment: Overall WFL for tasks assessed           Lower Extremity Assessment: Generalized weakness         Communication   Communication: No difficulties  Cognition Arousal/Alertness: Awake/alert Behavior During Therapy: WFL for tasks assessed/performed Overall Cognitive Status: Within Functional Limits for tasks assessed                      General Comments      Exercises  Assessment/Plan    PT Assessment Patient needs continued PT services  PT Diagnosis Generalized weakness;Difficulty walking;Abnormality of gait   PT Problem List Decreased strength;Decreased activity tolerance;Decreased balance;Decreased mobility;Decreased coordination  PT Treatment Interventions Gait training;Stair training;Functional mobility training;Therapeutic activities;Therapeutic exercise;Balance training;Neuromuscular re-education;Patient/family education   PT Goals (Current goals can be found in the Care Plan  section) Acute Rehab PT Goals Patient Stated Goal: Pt wants to go back home.  PT Goal Formulation: With patient/family Time For Goal Achievement: 12/21/15 Potential to Achieve Goals: Good    Frequency Min 2X/week   Barriers to discharge Decreased caregiver support      Co-evaluation               End of Session Equipment Utilized During Treatment: Gait belt Activity Tolerance: Patient tolerated treatment well Patient left: in chair;with call bell/phone within reach;with family/visitor present      Functional Assessment Tool Used: [employer] "6-clicks"  Functional Limitation: Mobility: Walking and moving around Mobility: Walking and Moving Around Current Status [phone]): At least 20 percent but less than 40 percent impaired, limited or restricted Mobility: Walking and Moving Around Goal Status [phone]): At least 1 percent but less than 20 percent impaired, limited or restricted    Time: 1019-1036 PT Time Calculation (min) (ACUTE ONLY): 17 min   Charges:   PT Evaluation $PT Eval Low Complexity: 1 Procedure     PT G Codes:   PT G-Codes **NOT FOR INPATIENT CLASS** Functional Assessment Tool Used: [employer] "6-clicks"  Functional Limitation: Mobility: Walking and moving around Mobility: Walking and Moving Around Current Status [phone]): At least 20 percent but less than 40 percent impaired, limited or restricted Mobility: Walking and Moving Around Goal Status [phone]): At least 1 percent but less than 20 percent impaired, limited or restricted    Beth Jennene Downie, PT, DPT X: [phone]

## 2015-12-14 NOTE — Care Management Note (Signed)
Case Management Note  Patient Details  Name: [name] MRN: [id number] Date of Birth: 1941/05/14  Subjective/Objective:                  Pt admitted with anemia. Pt is from home and is ind with ADL's. Pt drives herself to appointments and  Has no difficulty affording medications. PT has recommended OP PT. Pt is not interested in OP PT referral and this time. Pt told to contact her PCP is she changes her mind after DC.  Action/Plan: Anticipate DC home today. No CM needs.   Expected Discharge Date:     12/14/2015             Expected Discharge Plan:  Home/Self Care  In-House Referral:  NA  Discharge planning Services  CM Consult  Post Acute Care Choice:    Choice offered to:     DME Arranged:    DME Agency:     HH Arranged:    HH Agency:     Status of Service:  Completed, signed off  If discussed at [employer] of Stay Meetings, dates discussed:    Additional Comments:  Malcolm Metro, RN 12/14/2015, 11:40 AM

## 2015-12-15 ENCOUNTER — Ambulatory Visit (HOSPITAL_COMMUNITY)
Admission: RE | Admit: 2015-12-15 | Discharge: 2015-12-15 | Disposition: A | Discharge status: Home / Self Care | Payer: Medicare Other | Source: Ambulatory Visit | Attending: *Deleted | Admitting: *Deleted

## 2015-12-15 DIAGNOSIS — Z940 Kidney transplant status: Secondary | ICD-10-CM | POA: Insufficient documentation

## 2015-12-15 DIAGNOSIS — R6881 Early satiety: Secondary | ICD-10-CM | POA: Insufficient documentation

## 2015-12-15 DIAGNOSIS — I700 Atherosclerosis of aorta: Secondary | ICD-10-CM | POA: Diagnosis not present

## 2015-12-15 DIAGNOSIS — R1011 Right upper quadrant pain: Secondary | ICD-10-CM | POA: Diagnosis present

## 2015-12-15 DIAGNOSIS — I2510 Atherosclerotic heart disease of native coronary artery without angina pectoris: Secondary | ICD-10-CM | POA: Insufficient documentation

## 2016-01-05 ENCOUNTER — Encounter (HOSPITAL_COMMUNITY)
Admission: RE | Admit: 2016-01-05 | Discharge: 2016-01-05 | Disposition: A | Discharge status: Home / Self Care | Payer: Medicare Other | Source: Ambulatory Visit | Attending: Nephrology | Admitting: Nephrology

## 2016-01-05 ENCOUNTER — Inpatient Hospital Stay (HOSPITAL_COMMUNITY)
Admission: EM | Admit: 2016-01-05 | Discharge: 2016-01-09 | DRG: 690 | Disposition: A | Discharge status: Home / Self Care | Payer: Medicare Other | Attending: Internal Medicine | Admitting: Internal Medicine

## 2016-01-05 ENCOUNTER — Emergency Department (HOSPITAL_COMMUNITY): Payer: Medicare Other

## 2016-01-05 ENCOUNTER — Encounter (HOSPITAL_COMMUNITY): Payer: Self-pay

## 2016-01-05 DIAGNOSIS — N184 Chronic kidney disease, stage 4 (severe): Secondary | ICD-10-CM | POA: Diagnosis present

## 2016-01-05 DIAGNOSIS — Z8249 Family history of ischemic heart disease and other diseases of the circulatory system: Secondary | ICD-10-CM

## 2016-01-05 DIAGNOSIS — I129 Hypertensive chronic kidney disease with stage 1 through stage 4 chronic kidney disease, or unspecified chronic kidney disease: Secondary | ICD-10-CM | POA: Diagnosis present

## 2016-01-05 DIAGNOSIS — K219 Gastro-esophageal reflux disease without esophagitis: Secondary | ICD-10-CM | POA: Diagnosis present

## 2016-01-05 DIAGNOSIS — N183 Chronic kidney disease, stage 3 unspecified: Secondary | ICD-10-CM

## 2016-01-05 DIAGNOSIS — N390 Urinary tract infection, site not specified: Secondary | ICD-10-CM | POA: Diagnosis present

## 2016-01-05 DIAGNOSIS — I10 Essential (primary) hypertension: Secondary | ICD-10-CM | POA: Diagnosis not present

## 2016-01-05 DIAGNOSIS — Z808 Family history of malignant neoplasm of other organs or systems: Secondary | ICD-10-CM | POA: Diagnosis not present

## 2016-01-05 DIAGNOSIS — R1031 Right lower quadrant pain: Secondary | ICD-10-CM | POA: Diagnosis present

## 2016-01-05 DIAGNOSIS — E785 Hyperlipidemia, unspecified: Secondary | ICD-10-CM | POA: Diagnosis present

## 2016-01-05 DIAGNOSIS — B999 Unspecified infectious disease: Secondary | ICD-10-CM

## 2016-01-05 DIAGNOSIS — Z794 Long term (current) use of insulin: Secondary | ICD-10-CM | POA: Diagnosis not present

## 2016-01-05 DIAGNOSIS — B9620 Unspecified Escherichia coli [E. coli] as the cause of diseases classified elsewhere: Secondary | ICD-10-CM | POA: Diagnosis present

## 2016-01-05 DIAGNOSIS — R7881 Bacteremia: Secondary | ICD-10-CM | POA: Diagnosis present

## 2016-01-05 DIAGNOSIS — E1122 Type 2 diabetes mellitus with diabetic chronic kidney disease: Secondary | ICD-10-CM | POA: Diagnosis present

## 2016-01-05 DIAGNOSIS — D696 Thrombocytopenia, unspecified: Secondary | ICD-10-CM | POA: Diagnosis present

## 2016-01-05 DIAGNOSIS — N189 Chronic kidney disease, unspecified: Secondary | ICD-10-CM | POA: Diagnosis not present

## 2016-01-05 DIAGNOSIS — Z833 Family history of diabetes mellitus: Secondary | ICD-10-CM

## 2016-01-05 DIAGNOSIS — Z940 Kidney transplant status: Secondary | ICD-10-CM

## 2016-01-05 DIAGNOSIS — R911 Solitary pulmonary nodule: Secondary | ICD-10-CM | POA: Diagnosis present

## 2016-01-05 DIAGNOSIS — E039 Hypothyroidism, unspecified: Secondary | ICD-10-CM | POA: Diagnosis present

## 2016-01-05 DIAGNOSIS — D638 Anemia in other chronic diseases classified elsewhere: Secondary | ICD-10-CM | POA: Diagnosis present

## 2016-01-05 DIAGNOSIS — D649 Anemia, unspecified: Secondary | ICD-10-CM | POA: Diagnosis present

## 2016-01-05 DIAGNOSIS — R509 Fever, unspecified: Secondary | ICD-10-CM

## 2016-01-05 LAB — URINALYSIS, ROUTINE W REFLEX MICROSCOPIC
Bilirubin Urine: NEGATIVE
GLUCOSE, UA: NEGATIVE mg/dL
Nitrite: NEGATIVE
PH: 6 (ref 5.0–8.0)
PROTEIN: 100 mg/dL — AB
SPECIFIC GRAVITY, URINE: 1.01 (ref 1.005–1.030)

## 2016-01-05 LAB — GLUCOSE, CAPILLARY
Glucose-Capillary: 70 mg/dL (ref 65–99)
Glucose-Capillary: 139 mg/dL — ABNORMAL HIGH (ref 65–99)

## 2016-01-05 LAB — HEMOGLOBIN AND HEMATOCRIT, BLOOD
HCT: 34.9 % — ABNORMAL LOW (ref 36.0–46.0)
Hemoglobin: 11.5 g/dL — ABNORMAL LOW (ref 12.0–15.0)

## 2016-01-05 LAB — CBC WITH DIFFERENTIAL/PLATELET
BASOS ABS: 0 10*3/uL (ref 0.0–0.1)
Basophils Relative: 0 %
Eosinophils Absolute: 0 10*3/uL (ref 0.0–0.7)
Eosinophils Relative: 0 %
HEMATOCRIT: 33.8 % — AB (ref 36.0–46.0)
HEMOGLOBIN: 11.1 g/dL — AB (ref 12.0–15.0)
LYMPHS PCT: 11 %
Lymphs Abs: 0.6 10*3/uL — ABNORMAL LOW (ref 0.7–4.0)
MCH: 29.8 pg (ref 26.0–34.0)
MCHC: 32.8 g/dL (ref 30.0–36.0)
MCV: 90.9 fL (ref 78.0–100.0)
Monocytes Absolute: 0.8 10*3/uL (ref 0.1–1.0)
Monocytes Relative: 14 %
NEUTROS ABS: 4.4 10*3/uL (ref 1.7–7.7)
Neutrophils Relative %: 75 %
Platelets: 93 10*3/uL — ABNORMAL LOW (ref 150–400)
RBC: 3.72 MIL/uL — AB (ref 3.87–5.11)
RDW: 16.1 % — ABNORMAL HIGH (ref 11.5–15.5)
WBC: 5.9 10*3/uL (ref 4.0–10.5)

## 2016-01-05 LAB — COMPREHENSIVE METABOLIC PANEL
ALT: 11 U/L — AB (ref 14–54)
AST: 17 U/L (ref 15–41)
Albumin: 3.8 g/dL (ref 3.5–5.0)
Alkaline Phosphatase: 99 U/L (ref 38–126)
Anion gap: 11 (ref 5–15)
BILIRUBIN TOTAL: 1.4 mg/dL — AB (ref 0.3–1.2)
BUN: 47 mg/dL — AB (ref 6–20)
CHLORIDE: 104 mmol/L (ref 101–111)
CO2: 20 mmol/L — ABNORMAL LOW (ref 22–32)
CREATININE: 2.93 mg/dL — AB (ref 0.44–1.00)
Calcium: 8.5 mg/dL — ABNORMAL LOW (ref 8.9–10.3)
GFR calc Af Amer: 17 mL/min — ABNORMAL LOW (ref >60)
GFR, EST NON AFRICAN AMERICAN: 15 mL/min — AB (ref >60)
Glucose, Bld: 83 mg/dL (ref 65–99)
Potassium: 4 mmol/L (ref 3.5–5.1)
Sodium: 135 mmol/L (ref 135–145)
Total Protein: 6.7 g/dL (ref 6.5–8.1)

## 2016-01-05 LAB — URINE MICROSCOPIC-ADD ON: Squamous Epithelial / LPF: NONE SEEN

## 2016-01-05 LAB — LACTIC ACID, PLASMA
Lactic Acid, Venous: 0.8 mmol/L (ref 0.5–1.9)
Lactic Acid, Venous: 0.6 mmol/L (ref 0.5–1.9)

## 2016-01-05 LAB — FERRITIN: Ferritin: 3787 ng/mL — ABNORMAL HIGH (ref 11–307)

## 2016-01-05 LAB — IRON AND TIBC
Iron: 25 ug/dL — ABNORMAL LOW (ref 28–170)
Saturation Ratios: 15 % (ref 10.4–31.8)
TIBC: 171 ug/dL — ABNORMAL LOW (ref 250–450)
UIBC: 146 ug/dL

## 2016-01-05 LAB — LIPASE, BLOOD: LIPASE: 11 U/L (ref 11–51)

## 2016-01-05 MED ORDER — HYOSCYAMINE SULFATE 0.125 MG PO TABS
0.1250 mg | ORAL_TABLET | ORAL | Status: DC | PRN
  Filled 2016-01-05: qty 1

## 2016-01-05 MED ORDER — MAGNESIUM OXIDE 400 (241.3 MG) MG PO TABS
400.0000 mg | ORAL_TABLET | Freq: Every day | ORAL | Status: DC
  Administered 2016-01-05 – 2016-01-09 (×5): 400 mg via ORAL
  Filled 2016-01-05 (×5): qty 1

## 2016-01-05 MED ORDER — DICYCLOMINE HCL 10 MG PO CAPS
10.0000 mg | ORAL_CAPSULE | Freq: Three times a day (TID) | ORAL | Status: DC
  Administered 2016-01-05 – 2016-01-09 (×12): 10 mg via ORAL
  Filled 2016-01-05 (×12): qty 1

## 2016-01-05 MED ORDER — SODIUM CHLORIDE 0.9 % IV SOLN: INTRAVENOUS | Status: DC

## 2016-01-05 MED ORDER — ACETAMINOPHEN 650 MG RE SUPP: 650.0000 mg | Freq: Four times a day (QID) | RECTAL | Status: DC | PRN

## 2016-01-05 MED ORDER — SODIUM CHLORIDE 0.9 % IV BOLUS (SEPSIS)
500.0000 mL | Freq: Once | INTRAVENOUS | Status: AC
  Administered 2016-01-05: 500 mL via INTRAVENOUS

## 2016-01-05 MED ORDER — PANTOPRAZOLE SODIUM 40 MG PO TBEC
40.0000 mg | DELAYED_RELEASE_TABLET | Freq: Every day | ORAL | Status: DC
  Administered 2016-01-05 – 2016-01-09 (×5): 40 mg via ORAL
  Filled 2016-01-05 (×5): qty 1

## 2016-01-05 MED ORDER — MYCOPHENOLATE MOFETIL 250 MG PO CAPS
250.0000 mg | ORAL_CAPSULE | Freq: Two times a day (BID) | ORAL | Status: DC
  Administered 2016-01-05 – 2016-01-09 (×8): 250 mg via ORAL
  Filled 2016-01-05 (×12): qty 1

## 2016-01-05 MED ORDER — TACROLIMUS 1 MG PO CAPS
1.0000 mg | ORAL_CAPSULE | Freq: Every day | ORAL | Status: DC
  Administered 2016-01-06: 1 mg via ORAL
  Filled 2016-01-05 (×3): qty 1

## 2016-01-05 MED ORDER — DARBEPOETIN ALFA 100 MCG/0.5ML IJ SOSY: 100.0000 ug | PREFILLED_SYRINGE | INTRAMUSCULAR | Status: DC

## 2016-01-05 MED ORDER — LEVOTHYROXINE SODIUM 75 MCG PO TABS
75.0000 ug | ORAL_TABLET | Freq: Every day | ORAL | Status: DC
Start: 2016-01-06 — End: 2016-01-09
  Administered 2016-01-06 – 2016-01-09 (×4): 75 ug via ORAL
  Filled 2016-01-05: qty 2
  Filled 2016-01-05: qty 1
  Filled 2016-01-05 (×2): qty 2

## 2016-01-05 MED ORDER — CLONAZEPAM 0.5 MG PO TABS
1.0000 mg | ORAL_TABLET | Freq: Every day | ORAL | Status: DC
  Administered 2016-01-05 – 2016-01-08 (×4): 1 mg via ORAL
  Filled 2016-01-05 (×4): qty 2

## 2016-01-05 MED ORDER — HEPARIN SODIUM (PORCINE) 5000 UNIT/ML IJ SOLN
5000.0000 [IU] | Freq: Three times a day (TID) | INTRAMUSCULAR | Status: DC
  Administered 2016-01-05 – 2016-01-06 (×2): 5000 [IU] via SUBCUTANEOUS
  Filled 2016-01-05 (×2): qty 1

## 2016-01-05 MED ORDER — ACETAMINOPHEN 325 MG PO TABS
650.0000 mg | ORAL_TABLET | Freq: Four times a day (QID) | ORAL | Status: DC | PRN
  Administered 2016-01-05 – 2016-01-08 (×2): 650 mg via ORAL
  Filled 2016-01-05 (×2): qty 2

## 2016-01-05 MED ORDER — TACROLIMUS 1 MG PO CAPS: 1.0000 mg | ORAL_CAPSULE | Freq: Every day | ORAL | Status: DC

## 2016-01-05 MED ORDER — SODIUM CHLORIDE 0.9% FLUSH
3.0000 mL | Freq: Two times a day (BID) | INTRAVENOUS | Status: DC
  Administered 2016-01-06 – 2016-01-08 (×3): 3 mL via INTRAVENOUS

## 2016-01-05 MED ORDER — DEXTROSE 5 % IV SOLN
1.0000 g | INTRAVENOUS | Status: DC
  Administered 2016-01-06: 1 g via INTRAVENOUS
  Filled 2016-01-05 (×2): qty 10

## 2016-01-05 MED ORDER — METOPROLOL TARTRATE 50 MG PO TABS
50.0000 mg | ORAL_TABLET | Freq: Two times a day (BID) | ORAL | Status: DC
  Administered 2016-01-05 – 2016-01-09 (×8): 50 mg via ORAL
  Filled 2016-01-05 (×8): qty 1

## 2016-01-05 MED ORDER — ACETAMINOPHEN 500 MG PO TABS
1000.0000 mg | ORAL_TABLET | Freq: Once | ORAL | Status: AC
  Administered 2016-01-05: 1000 mg via ORAL
  Filled 2016-01-05: qty 2

## 2016-01-05 MED ORDER — DEXTROSE 5 % IV SOLN
1.0000 g | Freq: Once | INTRAVENOUS | Status: AC
  Administered 2016-01-05: 1 g via INTRAVENOUS
  Filled 2016-01-05: qty 10

## 2016-01-05 MED ORDER — INSULIN ASPART 100 UNIT/ML ~~LOC~~ SOLN
0.0000 [IU] | Freq: Three times a day (TID) | SUBCUTANEOUS | Status: DC
  Administered 2016-01-07 – 2016-01-08 (×4): 1 [IU] via SUBCUTANEOUS

## 2016-01-05 MED ORDER — TACROLIMUS 0.5 MG PO CAPS
0.5000 mg | ORAL_CAPSULE | Freq: Every day | ORAL | Status: DC
  Filled 2016-01-05: qty 1

## 2016-01-05 MED ORDER — INSULIN ASPART 100 UNIT/ML ~~LOC~~ SOLN: 0.0000 [IU] | Freq: Every day | SUBCUTANEOUS | Status: DC

## 2016-01-05 MED ORDER — SODIUM CHLORIDE 0.9 % IV SOLN
INTRAVENOUS | Status: DC
  Administered 2016-01-05: 14:00:00 via INTRAVENOUS

## 2016-01-05 MED ORDER — FLUCONAZOLE 100 MG PO TABS
100.0000 mg | ORAL_TABLET | Freq: Every day | ORAL | Status: DC
  Administered 2016-01-05 – 2016-01-09 (×5): 100 mg via ORAL
  Filled 2016-01-05 (×5): qty 1

## 2016-01-05 MED ORDER — ASPIRIN EC 81 MG PO TBEC
81.0000 mg | DELAYED_RELEASE_TABLET | Freq: Every day | ORAL | Status: DC
  Administered 2016-01-05 – 2016-01-08 (×4): 81 mg via ORAL
  Filled 2016-01-05 (×4): qty 1

## 2016-01-05 NOTE — ED Provider Notes (Signed)
[facility] DEPT Provider Note   CSN: [id number] Arrival date & time: 01/05/16  1108     History   Chief Complaint Chief Complaint  Patient presents with  . Fever  . Flank Pain    HPI [name] is a 75 y.o. female.  HPI  Pt was seen at 1135.  Per pt, c/o gradual onset and persistence of constant "fevers" for the past 3 days. Pt's Tmax "100.9" at home. LD APAP was yesterday. Pt denies any other complaints. Denies sore throat, no rash, no neck pain, no CP/SOB, no cough, no abd pain, no N/V/D.   Past Medical History:  Diagnosis Date  . Anemia   . Arthritis    "knees" (10/16/2014)  . Carotid artery stenosis   . Chronic kidney disease    h/o transplant  . Family history of adverse reaction to anesthesia    "my daughter; they thought she was asleep and she wasn't"  . Frequent UTI   . GERD (gastroesophageal reflux disease)   . History of blood transfusion "several"   "all related to anemia"  . Hyperlipemia   . Hypertension   . Hypothyroidism   . Pneumonia "1-2 times"  . Type II diabetes mellitus [facility])     Patient Active Problem List   Diagnosis Date Noted  . AKI (acute kidney injury) ([place]) 12/12/2015  . Diabetes mellitus ([place]) 12/12/2015  . CKD (chronic kidney disease) stage 4, GFR 15-29 ml/min (H[facility]) 12/12/2015  . Dehydration 12/12/2015  . Pancreatic insufficiency ([place]) 09/28/2015  . Type 2 diabetes mellitus with stage 3 chronic kidney disease, with long-term current use of insulin ([place]) 02/23/2015  . Chest pain 10/18/2014  . Nausea with vomiting 10/18/2014  . SOB (shortness of breath) 10/18/2014  . Demand ischemia ([place]) 10/18/2014  . 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 Pain in the chest   . Skin infection   . Carotid artery stenosis 10/17/2014  . Malnutrition of moderate degree ([place]) 10/17/2014  . Sepsis ([place]) 10/17/2014  . ARF (acute renal failure) ([place])   . Cellulitis 10/16/2014  . Cellulitis of right groin 10/16/2014  . GERD  (gastroesophageal reflux disease)   . Hypothyroidism   . History of kidney transplant   . Hyperkalemia   . UTI (lower urinary tract infection)   . Atrial fibrillation ([place]) 05/25/2009  . Absolute anemia 05/06/2009  . Essential hypertension 05/06/2009  . Hyperlipidemia 10/28/2008    Past Surgical History:  Procedure Laterality Date  . ABDOMINAL HYSTERECTOMY    . APPENDECTOMY    . BACK SURGERY    . CHOLECYSTECTOMY OPEN    . KIDNEY SURGERY     multiple  . KIDNEY TRANSPLANT Right 2004   at [facility]  . LUMBAR DISC SURGERY     "L5"  . THYROIDECTOMY     1/2 removed  . TONSILLECTOMY    . TUBAL LIGATION         Home Medications    Prior to Admission medications   Medication Sig Start Date End Date Taking? Authorizing Provider  aspirin 81 MG tablet Take 81 mg by mouth at bedtime.    Yes Historical Provider, MD  Blood Glucose Monitoring Suppl (ACCU-CHEK AVIVA PLUS) w/Device KIT Use as directed 2 x daily 06/19/15  Yes Cassandria Anger, MD  clonazePAM (KLONOPIN) 1 MG tablet Take 1 mg by mouth at bedtime.   Yes Historical Provider, MD  dicyclomine (BENTYL) 10 MG capsule Take 10 mg by mouth 3 (three) times daily before meals.  Yes Historical Provider, MD  fluconazole (DIFLUCAN) 100 MG tablet Take 100 mg by mouth daily.   Yes Historical Provider, MD  glucose blood (ACCU-CHEK AVIVA) test strip Use as instructed bid 06/29/15  Yes Cassandria Anger, MD  hyoscyamine (LEVSIN, ANASPAZ) 0.125 MG tablet Take 0.125 mg by mouth every 4 (four) hours as needed for cramping.    Yes Historical Provider, MD  levothyroxine (SYNTHROID, LEVOTHROID) 75 MCG tablet Take 75 mcg by mouth daily before breakfast.   Yes Historical Provider, MD  magnesium oxide (MAG-OX) 400 MG tablet Take 400 mg by mouth daily.   Yes Historical Provider, MD  metoprolol (LOPRESSOR) 50 MG tablet Take 50 mg by mouth 2 (two) times daily.   Yes Historical Provider, MD  mycophenolate (CELLCEPT) 500 MG tablet Take 250 mg by mouth 2  (two) times daily.    Yes Historical Provider, MD  pantoprazole (PROTONIX) 40 MG tablet Take 40 mg by mouth daily. 10/11/14  Yes Historical Provider, MD  sodium bicarbonate 650 MG tablet Take 1 tablet (650 mg total) by mouth 2 (two) times daily. Patient taking differently: Take 1,300 mg by mouth daily.  10/21/14  Yes Eugenie Filler, MD  tacrolimus (PROGRAF) 0.5 MG capsule Take 1 mg by mouth daily. 1 mg in the morning and 0.5 mg nightly.   Yes Historical Provider, MD    Family History Family History  Problem Relation Age of Onset  . Diabetes Mother   . Hypertension Mother   . Melanoma Mother   . Hyperlipidemia Mother   . Heart disease Father   . Varicose Veins Father   . Hypertension Sister   . Cancer Daughter   . Hypertension Son   . Hyperlipidemia Sister   . Hyperlipidemia Daughter   . Hyperlipidemia Son   . Hypertension Daughter     Social History Social History  Substance Use Topics  . Smoking status: Never Smoker  . Smokeless tobacco: Never Used  . Alcohol use No     Allergies   Codeine; Elavil [amitriptyline]; Iodinated diagnostic agents; Lipitor [atorvastatin]; Magnesium; Metoclopramide; Morphine; Morphine and related; Neurontin [gabapentin]; Niacin and related; Norvasc [amlodipine besylate]; Talwin [pentazocine]; and Ultram [tramadol]   Review of Systems Review of Systems ROS: Statement: All systems negative except as marked or noted in the HPI; Constitutional: +fever and chills. ; ; Eyes: Negative for eye pain, redness and discharge. ; ; ENMT: Negative for ear pain, hoarseness, nasal congestion, sinus pressure and sore throat. ; ; Cardiovascular: Negative for chest pain, palpitations, diaphoresis, dyspnea and peripheral edema. ; ; Respiratory: Negative for cough, wheezing and stridor. ; ; Gastrointestinal: Negative for nausea, vomiting, diarrhea, abdominal pain, blood in stool, hematemesis, jaundice and rectal bleeding. . ; ; Genitourinary: Negative for dysuria, flank  pain and hematuria. ; ; Musculoskeletal: Negative for back pain and neck pain. Negative for swelling and trauma.; ; Skin: Negative for pruritus, rash, abrasions, blisters, bruising and skin lesion.; ; Neuro: Negative for headache, lightheadedness and neck stiffness. Negative for weakness, altered level of consciousness, altered mental status, extremity weakness, paresthesias, involuntary movement, seizure and syncope.       Physical Exam Updated Vital Signs BP 108/55   Pulse 86   Temp 98.6 F (37 C) (Oral)   Resp 16   Ht _0  (1.6 m)   Wt 91 lb (41.3 kg)   SpO2 99%   BMI 16.12 kg/m    Patient Vitals for the past 24 hrs:  BP Temp Temp src Pulse Resp SpO2 Height  Weight  01/05/16 1300 - 98.6 F (37 C) Oral - - - - -  01/05/16 1230 108/55 - - 86 16 99 % - -  01/05/16 1205 116/72 - - 87 16 99 % - -  01/05/16 1124 (!) 100/54 100 F (37.8 C) Oral 94 18 100 % _0  (1.6 m) 91 lb (41.3 kg)      Physical Exam 1140: Physical examination:  Nursing notes reviewed; Vital signs and O2 SAT reviewed;  Constitutional: Well developed, Well nourished, Well hydrated, In no acute distress; Head:  Normocephalic, atraumatic; Eyes: EOMI, PERRL, No scleral icterus; ENMT: TM's clear bilat Mouth and pharynx without lesions. No tonsillar exudates. No intra-oral edema. No submandibular or sublingual edema. No hoarse voice, no drooling, no stridor. No pain with manipulation of larynx. No trismus.  Mouth and pharynx normal, Mucous membranes moist; Neck: Supple, Full range of motion, No lymphadenopathy; Cardiovascular: Regular rate and rhythm, No gallop; Respiratory: Breath sounds clear & equal bilaterally, No wheezes.  Speaking full sentences with ease, Normal respiratory effort/excursion; Chest: Nontender, Movement normal; Abdomen: Soft, Nontender, Nondistended, Normal bowel sounds; Genitourinary: No CVA tenderness; Spine:  No midline CS, TS, LS tenderness.;; Extremities: Pulses normal, No tenderness, No edema, No  calf edema or asymmetry.; Neuro: AA&Ox3, Major CN grossly intact.  Speech clear. No gross focal motor or sensory deficits in extremities. Climbs on and off stretcher easily by herself. Gait steady.; Skin: Color normal, Warm, Dry.   ED Treatments / Results  Labs (all labs ordered are listed, but only abnormal results are displayed)   EKG  EKG Interpretation None       Radiology   Procedures Procedures (including critical care time)  Medications Ordered in ED Medications  cefTRIAXone (ROCEPHIN) 1 g in dextrose 5 % 50 mL IVPB (1 g Intravenous New Bag/Given 01/05/16 1258)  sodium chloride 0.9 % bolus 500 mL (not administered)  0.9 %  sodium chloride infusion (not administered)  acetaminophen (TYLENOL) tablet 1,000 mg (1,000 mg Oral Given 01/05/16 1208)     Initial Impression / Assessment and Plan / ED Course  I have reviewed the triage vital signs and the nursing notes.  Pertinent labs & imaging results that were available during my care of the patient were reviewed by me and considered in my medical decision making (see chart for details).   MDM Reviewed: previous chart, nursing note and vitals Reviewed previous: labs and ECG Interpretation: labs, ECG and x-ray   Results for orders placed or performed during the hospital encounter of 01/05/16  CBC with Differential  Result Value Ref Range   WBC 5.9 4.0 - 10.5 K/uL   RBC 3.72 (L) 3.87 - 5.11 MIL/uL   Hemoglobin 11.1 (L) 12.0 - 15.0 g/dL   HCT 33.8 (L) 36.0 - 46.0 %   MCV 90.9 78.0 - 100.0 fL   MCH 29.8 26.0 - 34.0 pg   MCHC 32.8 30.0 - 36.0 g/dL   RDW 16.1 (H) 11.5 - 15.5 %   Platelets 93 (L) 150 - 400 K/uL   Neutrophils Relative % 75 %   Neutro Abs 4.4 1.7 - 7.7 K/uL   Lymphocytes Relative 11 %   Lymphs Abs 0.6 (L) 0.7 - 4.0 K/uL   Monocytes Relative 14 %   Monocytes Absolute 0.8 0.1 - 1.0 K/uL   Eosinophils Relative 0 %   Eosinophils Absolute 0.0 0.0 - 0.7 K/uL   Basophils Relative 0 %   Basophils Absolute  0.0 0.0 - 0.1 K/uL  Urinalysis,  Routine w reflex microscopic (not at [facility])  Result Value Ref Range   Color, Urine YELLOW YELLOW   APPearance TURBID (A) CLEAR   Specific Gravity, Urine 1.010 1.005 - 1.030   pH 6.0 5.0 - 8.0   Glucose, UA NEGATIVE NEGATIVE mg/dL   Hgb urine dipstick MODERATE (A) NEGATIVE   Bilirubin Urine NEGATIVE NEGATIVE   Ketones, ur TRACE (A) NEGATIVE mg/dL   Protein, ur 100 (A) NEGATIVE mg/dL   Nitrite NEGATIVE NEGATIVE   Leukocytes, UA LARGE (A) NEGATIVE  Comprehensive metabolic panel  Result Value Ref Range   Sodium 135 135 - 145 mmol/L   Potassium 4.0 3.5 - 5.1 mmol/L   Chloride 104 101 - 111 mmol/L   CO2 20 (L) 22 - 32 mmol/L   Glucose, Bld 83 65 - 99 mg/dL   BUN 47 (H) 6 - 20 mg/dL   Creatinine, Ser 2.93 (H) 0.44 - 1.00 mg/dL   Calcium 8.5 (L) 8.9 - 10.3 mg/dL   Total Protein 6.7 6.5 - 8.1 g/dL   Albumin 3.8 3.5 - 5.0 g/dL   AST 17 15 - 41 U/L   ALT 11 (L) 14 - 54 U/L   Alkaline Phosphatase 99 38 - 126 U/L   Total Bilirubin 1.4 (H) 0.3 - 1.2 mg/dL   GFR calc non Af Amer 15 (L) >60 mL/min   GFR calc Af Amer 17 (L) >60 mL/min   Anion gap 11 5 - 15  Lipase, blood  Result Value Ref Range   Lipase 11 11 - 51 U/L  Lactic acid, plasma  Result Value Ref Range   Lactic Acid, Venous 0.8 0.5 - 1.9 mmol/L  Urine microscopic-add on  Result Value Ref Range   Squamous Epithelial / LPF NONE SEEN NONE SEEN   WBC, UA TOO NUMEROUS TO COUNT 0 - 5 WBC/hpf   RBC / HPF TOO NUMEROUS TO COUNT 0 - 5 RBC/hpf   Bacteria, UA MANY (A) NONE SEEN    Results for [name], [name] (MRN [id number]) as of 01/05/2016 13:30  Ref. Range 10/20/2014 03:48 10/21/2014 04:05 12/12/2015 12:29 12/13/2015 05:44 12/14/2015 05:22 01/05/2016 11:46  Platelets Latest Ref Range: 150 - 400 K/uL 168 172 109 (L) 107 (L) 125 (L) 93 (L)     Results for [name], [name] (MRN [id number]) as of 01/05/2016 13:30  Ref. Range 10/20/2014 03:48 10/21/2014 04:05 07/20/2015 10:30 12/12/2015 12:29 12/13/2015 05:44 12/14/2015  05:22 01/05/2016 11:56  BUN Latest Ref Range: 6 - 20 mg/dL 27 (H) 24 (H) 44 (H) 57 (H) 43 (H) 34 (H) 47 (H)  Creatinine Latest Ref Range: 0.44 - 1.00 mg/dL 2.40 (H) 2.18 (H) 2.10 (H) 3.58 (H) 2.80 (H) 2.18 (H) 2.93 (H)    Dg Chest 2 View Result Date: 01/05/2016 CLINICAL DATA:  Fever and fatigue. EXAM: CHEST  2 VIEW COMPARISON:  12/12/2015 FINDINGS: The heart size and mediastinal contours are within normal limits. 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 There is a stable 10 mm nodular density overlying the superior left hemithorax. Both lungs are otherwise clear. The visualized skeletal structures are unremarkable. Cholecystectomy clips are noted in the upper abdomen. IMPRESSION: No active cardiopulmonary disease. Stable 10 mm nodular density in the left upper lung field. 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 Attention on follow-up is recommended. Electronically Signed   By: Fidela Salisbury M.D.   On: 01/05/2016 14:07     1315:  APAP given for fever. +UTI, UC pending; IV rocephin given after  BC and UC obtained. Pt's SBP slightly lower than her norm, per EPIC chart review; judicious IVF given. Dx and testing d/w pt and family.  Questions answered.  Verb understanding, agreeable to admit.  T/C to [facility] Dr. Wyline Copas, case discussed, including:  HPI, pertinent PM/SHx, VS/PE, dx testing, ED course and treatment:  Agreeable to admit, requests to write temporary orders, obtain inpt tele bed to team APAdmits.    Final Clinical Impressions(s) / ED Diagnoses   Final diagnoses:  None    New Prescriptions New Prescriptions   No medications on file     Francine Graven, DO 01/08/16 1501

## 2016-01-05 NOTE — ED Triage Notes (Signed)
PT reports r flank pain for " a while" and started having fever the past 3 days.  Pt was admitted last month for a kidney infection.

## 2016-01-05 NOTE — ED Notes (Signed)
Pt to xray at this time.

## 2016-01-05 NOTE — Progress Notes (Signed)
Results for [name], Velna T (MRN [id number]) as of 01/05/2016 14:01  Ref. Range 01/05/2016 11:46  WBC Latest Ref Range: 4.0 - 10.5 K/uL 5.9  RBC Latest Ref Range: 3.87 - 5.11 MIL/uL 3.72 (L)  Hemoglobin Latest Ref Range: 12.0 - 15.0 g/dL 78.211.1 (L)  HCT Latest Ref Range: 36.0 - 46.0 % 33.8 (L)  MCV Latest Ref Range: 78.0 - 100.0 fL 90.9  MCH Latest Ref Range: 26.0 - 34.0 pg 29.8  MCHC Latest Ref Range: 30.0 - 36.0 g/dL 95.632.8  RDW Latest Ref Range: 11.5 - 15.5 % 16.1 (H)  Platelets Latest Ref Range: 150 - 400 K/uL 93 (L)  Neutrophils Latest Units: % 75  Lymphocytes Latest Units: % 11  Monocytes Relative Latest Units: % 14  Eosinophil Latest Units: % 0  Basophil Latest Units: % 0  NEUT# Latest Ref Range: 1.7 - 7.7 K/uL 4.4  Lymphocyte # Latest Ref Range: 0.7 - 4.0 K/uL 0.6 (L)  Monocyte # Latest Ref Range: 0.1 - 1.0 K/uL 0.8  Eosinophils Absolute Latest Ref Range: 0.0 - 0.7 K/uL 0.0  Basophils Absolute Latest Ref Range: 0.0 - 0.1 K/uL 0.0

## 2016-01-05 NOTE — Progress Notes (Signed)
Pt here for monthly visit to [facility]. Presented with c/o general malaise, fever, chills, decreased appetite, and lower abdominal pain. She had a recent hospitalization with similar complaints/sympts. Upon checking vital sign, she was found to have a temp of 100.8 F and marginal BP 90/50. Contacted Dr. Briant CedarMattingly  Who instructed the pt to go to the ED. Pt transported via WC to ED.

## 2016-01-05 NOTE — H&P (Signed)
History and Physical    [name]:[id number] DOB: Feb 28, 1941 DOA: 01/05/2016  PCP: [name], DO  Patient coming from: Home  Chief Complaint: Flank pain  HPI: [name] is a 75 y.o. 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 Of note, patient was admitted approximately 1 month prior for similar issues.  ED Course: In the ED, pt noted to have UA suggestive of UTI. Pt was given empiric rocephin. WBC noted to be 5.9 with plts of 93. Lactate was within normal limits. Presenting temp of 100.40F. Given concerns of UTI, hospitalist consulted for consideration for admission.  Review of Systems:  Review of Systems  Constitutional: Positive for fever. Negative for weight loss.  HENT: Negative for ear pain and tinnitus.   Eyes: Negative for photophobia and pain.  Respiratory: Negative for hemoptysis and sputum production.   Cardiovascular: Negative for claudication and leg swelling.  Gastrointestinal: Negative for blood in stool and constipation.  Genitourinary: Positive for dysuria and flank pain.  Musculoskeletal: Negative for back pain and joint pain.  Neurological: Negative for tremors, seizures and loss of consciousness.  Psychiatric/Behavioral: Negative for hallucinations and memory loss. The patient is not nervous/anxious.     Past Medical History:  Diagnosis Date  . Anemia   . Arthritis    "knees" (10/16/2014)  . Carotid artery stenosis   . Chronic kidney disease    h/o transplant  . Family history of adverse reaction to anesthesia    "my daughter; they thought she was asleep and she wasn't"  . Frequent UTI   . GERD (gastroesophageal reflux disease)   . History of blood transfusion "several"   "all related to anemia"  . Hyperlipemia   . Hypertension   . Hypothyroidism   . Pneumonia "1-2 times"  . Type II diabetes mellitus ([place])      Past Surgical History:  Procedure Laterality Date  . ABDOMINAL HYSTERECTOMY    . APPENDECTOMY    . BACK SURGERY    . CHOLECYSTECTOMY OPEN    . KIDNEY SURGERY     multiple  . KIDNEY TRANSPLANT Right 2004   at [facility]  . LUMBAR DISC SURGERY     "L5"  . THYROIDECTOMY     1/2 removed  . TONSILLECTOMY    . TUBAL LIGATION       reports that she has never smoked. She has never used smokeless tobacco. She reports that she does not drink alcohol or use drugs.  Allergies  Allergen Reactions  . Codeine Nausea And Vomiting  . Elavil [Amitriptyline] Other (See Comments)    unknown  . Iodinated Diagnostic Agents      Renal transplant pt  . Lipitor [Atorvastatin]   . Magnesium Hives    Pt tolerated oral and IV during 06/2014 admission   . Metoclopramide     Other reaction(s): Other (See Comments) "doesn't work"  . Morphine     Other reaction(s): Confusion (intolerance)  . Morphine And Related   . Neurontin [Gabapentin]   . Niacin And Related   . Norvasc [Amlodipine Besylate]   . Talwin [Pentazocine]   . Ultram [Tramadol]     Family History  Problem Relation Age of Onset  . Diabetes Mother   . Hypertension Mother   . Melanoma Mother   . Hyperlipidemia Mother   . Heart disease Father   .  Varicose Veins Father   . Hypertension Sister   . Cancer Daughter   . Hypertension Son   . Hyperlipidemia Sister   . Hyperlipidemia Daughter   . Hyperlipidemia Son   . Hypertension Daughter     Prior to Admission medications   Medication Sig Start Date End Date Taking? Authorizing Provider  aspirin 81 MG tablet Take 81 mg by mouth at bedtime.    Yes Historical Provider, MD  Blood Glucose Monitoring Suppl (ACCU-CHEK AVIVA PLUS) w/Device KIT Use as directed 2 x daily 06/19/15  Yes Cassandria Anger, MD  clonazePAM (KLONOPIN) 1 MG tablet Take 1 mg by mouth at bedtime.   Yes Historical Provider, MD  dicyclomine (BENTYL) 10 MG capsule Take 10 mg by mouth 3 (three) times daily before  meals.    Yes Historical Provider, MD  fluconazole (DIFLUCAN) 100 MG tablet Take 100 mg by mouth daily.   Yes Historical Provider, MD  glucose blood (ACCU-CHEK AVIVA) test strip Use as instructed bid 06/29/15  Yes Cassandria Anger, MD  hyoscyamine (LEVSIN, ANASPAZ) 0.125 MG tablet Take 0.125 mg by mouth every 4 (four) hours as needed for cramping.    Yes Historical Provider, MD  levothyroxine (SYNTHROID, LEVOTHROID) 75 MCG tablet Take 75 mcg by mouth daily before breakfast.   Yes Historical Provider, MD  magnesium oxide (MAG-OX) 400 MG tablet Take 400 mg by mouth daily.   Yes Historical Provider, MD  metoprolol (LOPRESSOR) 50 MG tablet Take 50 mg by mouth 2 (two) times daily.   Yes Historical Provider, MD  mycophenolate (CELLCEPT) 500 MG tablet Take 250 mg by mouth 2 (two) times daily.    Yes Historical Provider, MD  pantoprazole (PROTONIX) 40 MG tablet Take 40 mg by mouth daily. 10/11/14  Yes Historical Provider, MD  sodium bicarbonate 650 MG tablet Take 1 tablet (650 mg total) by mouth 2 (two) times daily. Patient taking differently: Take 1,300 mg by mouth daily.  10/21/14  Yes Eugenie Filler, MD  tacrolimus (PROGRAF) 0.5 MG capsule Take 1 mg by mouth daily. 1 mg in the morning and 0.5 mg nightly.   Yes Historical Provider, MD    Physical Exam: Vitals:   01/05/16 1124 01/05/16 1205 01/05/16 1230 01/05/16 1300  BP: (!) 100/54 116/72 108/55   Pulse: 94 87 86   Resp: 18 16 16    Temp: 100 F (37.8 C)   98.6 F (37 C)  TempSrc: Oral   Oral  SpO2: 100% 99% 99%   Weight: 41.3 kg (91 lb)     Height: 5' 3"  (1.6 m)       Constitutional: NAD, calm, comfortable Vitals:   01/05/16 1124 01/05/16 1205 01/05/16 1230 01/05/16 1300  BP: (!) 100/54 116/72 108/55   Pulse: 94 87 86   Resp: 18 16 16    Temp: 100 F (37.8 C)   98.6 F (37 C)  TempSrc: Oral   Oral  SpO2: 100% 99% 99%   Weight: 41.3 kg (91 lb)     Height: 5' 3"  (1.6 m)      Eyes: PERRL, lids and conjunctivae normal ENMT:  Mucous membranes are moist. Posterior pharynx clear of any exudate or lesions.Normal dentition.  Neck: normal, supple, no masses, no thyromegaly Respiratory: clear to auscultation bilaterally, no wheezing, no crackles. Normal respiratory effort. No accessory muscle use.  Cardiovascular: Regular rate and rhythm, Abdomen: no tenderness, no masses palpated. No hepatosplenomegaly. Bowel sounds positive.  Musculoskeletal: no clubbing / cyanosis. No joint deformity upper and  lower extremities. Good ROM, no contractures. Normal muscle tone.  Skin: no rashes, lesion No induration Neurologic: CN 2-12 grossly intact. Sensation intact, DTR normal. Strength 5/5 in all 4.  Psychiatric: Normal judgment and insight. Alert and oriented x 3. Normal mood.    Labs on Admission: I have personally reviewed following labs and imaging studies  CBC:  Recent Labs Lab 01/05/16 1040 01/05/16 1146  WBC  --  5.9  NEUTROABS  --  4.4  HGB 11.5* 11.1*  HCT 34.9* 33.8*  MCV  --  90.9  PLT  --  93*   Basic Metabolic Panel:  Recent Labs Lab 01/05/16 1156  NA 135  K 4.0  CL 104  CO2 20*  GLUCOSE 83  BUN 47*  CREATININE 2.93*  CALCIUM 8.5*   GFR: Estimated Creatinine Clearance: 11.1 mL/min (by C-G formula based on SCr of 2.93 mg/dL). Liver Function Tests:  Recent Labs Lab 01/05/16 1156  AST 17  ALT 11*  ALKPHOS 99  BILITOT 1.4*  PROT 6.7  ALBUMIN 3.8    Recent Labs Lab 01/05/16 1156  LIPASE 11   No results for input(s): AMMONIA in the last 168 hours. Coagulation Profile: No results for input(s): INR, PROTIME in the last 168 hours. Cardiac Enzymes: No results for input(s): CKTOTAL, CKMB, CKMBINDEX, TROPONINI in the last 168 hours. BNP (last 3 results) No results for input(s): PROBNP in the last 8760 hours. HbA1C: No results for input(s): HGBA1C in the last 72 hours. CBG: No results for input(s): GLUCAP in the last 168 hours. Lipid Profile: No results for input(s): CHOL, HDL,  LDLCALC, TRIG, CHOLHDL, LDLDIRECT in the last 72 hours. Thyroid Function Tests: No results for input(s): TSH, T4TOTAL, FREET4, T3FREE, THYROIDAB in the last 72 hours. Anemia Panel: No results for input(s): VITAMINB12, FOLATE, FERRITIN, TIBC, IRON, RETICCTPCT in the last 72 hours. Urine analysis:    Component Value Date/Time   COLORURINE YELLOW 01/05/2016 1200   APPEARANCEUR TURBID (A) 01/05/2016 1200   LABSPEC 1.010 01/05/2016 1200   PHURINE 6.0 01/05/2016 1200   GLUCOSEU NEGATIVE 01/05/2016 1200   HGBUR MODERATE (A) 01/05/2016 1200   BILIRUBINUR NEGATIVE 01/05/2016 1200   KETONESUR TRACE (A) 01/05/2016 1200   PROTEINUR 100 (A) 01/05/2016 1200   UROBILINOGEN 0.2 10/16/2014 2059   NITRITE NEGATIVE 01/05/2016 1200   LEUKOCYTESUR LARGE (A) 01/05/2016 1200   Sepsis Labs: !!!!!!!!!!!!!!!!!!!!!!!!!!!!!!!!!!!!!!!!!!!! @LABRCNTIP (procalcitonin:4,lacticidven:4) )No results found for this or any previous visit (from the past 240 hour(s)).   Radiological Exams on Admission: No results found.   Assessment/Plan Principal Problem:   UTI (lower urinary tract infection) Active Problems:   Hyperlipidemia   Essential hypertension   Type 2 diabetes mellitus with stage 3 chronic kidney disease, with long-term current use of insulin ([place])   1. UTI 1. Presenting urinalysis strongly suggestive of urinary tract infection 2. Urine culture pending 3. Patient continued on empiric Rocephin. We'll continue 4. Will admit patient to med telemetry 2. Fevers 1. Likely secondary to above UTI 2. Continue antipyretics as needed 3. Hyperlipidemia 1. Continue home regimen 4. Hypertension 1. Blood pressure currently stable 2. Continue to monitor 5. Diabetes, type II 1. We'll continue patient on sliding scale insulin 6. Status post renal transplant 1. We'll continue patient on immunosuppressive status tolerated 7. CK D 1. Monitor renal function closely  DVT prophylaxis: Heparin subcutaneous  Code  Status: Full code Family Communication: Patient in room, sister at bedside  Disposition Plan: Uncertain at this time  Consults called:  Admission status: Admit  to med telemetry, inpatient   Rya Rausch, Orpah Melter MD [facility] Pager [phone]  If 7PM-7AM, please contact night-coverage [email or web address] Password [email or web address]  01/05/2016, 1:20 PM

## 2016-01-06 ENCOUNTER — Inpatient Hospital Stay (HOSPITAL_COMMUNITY): Payer: Medicare Other

## 2016-01-06 DIAGNOSIS — E785 Hyperlipidemia, unspecified: Secondary | ICD-10-CM

## 2016-01-06 DIAGNOSIS — N189 Chronic kidney disease, unspecified: Secondary | ICD-10-CM

## 2016-01-06 DIAGNOSIS — E039 Hypothyroidism, unspecified: Secondary | ICD-10-CM

## 2016-01-06 DIAGNOSIS — N390 Urinary tract infection, site not specified: Principal | ICD-10-CM

## 2016-01-06 DIAGNOSIS — D696 Thrombocytopenia, unspecified: Secondary | ICD-10-CM | POA: Diagnosis present

## 2016-01-06 DIAGNOSIS — D631 Anemia in chronic kidney disease: Secondary | ICD-10-CM

## 2016-01-06 DIAGNOSIS — R7881 Bacteremia: Secondary | ICD-10-CM | POA: Diagnosis present

## 2016-01-06 DIAGNOSIS — N184 Chronic kidney disease, stage 4 (severe): Secondary | ICD-10-CM

## 2016-01-06 DIAGNOSIS — I10 Essential (primary) hypertension: Secondary | ICD-10-CM

## 2016-01-06 DIAGNOSIS — R911 Solitary pulmonary nodule: Secondary | ICD-10-CM | POA: Diagnosis present

## 2016-01-06 LAB — BLOOD CULTURE ID PANEL (REFLEXED)
Acinetobacter baumannii: NOT DETECTED
CANDIDA PARAPSILOSIS: NOT DETECTED
CARBAPENEM RESISTANCE: NOT DETECTED
Candida albicans: NOT DETECTED
Candida glabrata: NOT DETECTED
Candida krusei: NOT DETECTED
Candida tropicalis: NOT DETECTED
ENTEROBACTERIACEAE SPECIES: DETECTED — AB
ENTEROCOCCUS SPECIES: NOT DETECTED
Enterobacter cloacae complex: NOT DETECTED
Escherichia coli: DETECTED — AB
HAEMOPHILUS INFLUENZAE: NOT DETECTED
Klebsiella oxytoca: NOT DETECTED
Klebsiella pneumoniae: NOT DETECTED
LISTERIA MONOCYTOGENES: NOT DETECTED
NEISSERIA MENINGITIDIS: NOT DETECTED
PSEUDOMONAS AERUGINOSA: NOT DETECTED
Proteus species: NOT DETECTED
STAPHYLOCOCCUS AUREUS BCID: NOT DETECTED
STREPTOCOCCUS PNEUMONIAE: NOT DETECTED
STREPTOCOCCUS PYOGENES: NOT DETECTED
STREPTOCOCCUS SPECIES: NOT DETECTED
Serratia marcescens: NOT DETECTED
Staphylococcus species: NOT DETECTED
Streptococcus agalactiae: NOT DETECTED

## 2016-01-06 LAB — CBC
HCT: 27.2 % — ABNORMAL LOW (ref 36.0–46.0)
Hemoglobin: 8.8 g/dL — ABNORMAL LOW (ref 12.0–15.0)
MCH: 30.1 pg (ref 26.0–34.0)
MCHC: 32.4 g/dL (ref 30.0–36.0)
MCV: 93.2 fL (ref 78.0–100.0)
Platelets: 73 10*3/uL — ABNORMAL LOW (ref 150–400)
RBC: 2.92 MIL/uL — AB (ref 3.87–5.11)
RDW: 16 % — ABNORMAL HIGH (ref 11.5–15.5)
WBC: 2.7 10*3/uL — AB (ref 4.0–10.5)

## 2016-01-06 LAB — COMPREHENSIVE METABOLIC PANEL
ALT: 8 U/L — AB (ref 14–54)
AST: 11 U/L — ABNORMAL LOW (ref 15–41)
Albumin: 2.8 g/dL — ABNORMAL LOW (ref 3.5–5.0)
Alkaline Phosphatase: 72 U/L (ref 38–126)
Anion gap: 6 (ref 5–15)
BUN: 41 mg/dL — ABNORMAL HIGH (ref 6–20)
CHLORIDE: 110 mmol/L (ref 101–111)
CO2: 21 mmol/L — ABNORMAL LOW (ref 22–32)
CREATININE: 2.57 mg/dL — AB (ref 0.44–1.00)
Calcium: 7.9 mg/dL — ABNORMAL LOW (ref 8.9–10.3)
GFR, EST AFRICAN AMERICAN: 20 mL/min — AB (ref >60)
GFR, EST NON AFRICAN AMERICAN: 17 mL/min — AB (ref >60)
Glucose, Bld: 80 mg/dL (ref 65–99)
POTASSIUM: 4.1 mmol/L (ref 3.5–5.1)
Sodium: 137 mmol/L (ref 135–145)
TOTAL PROTEIN: 5.1 g/dL — AB (ref 6.5–8.1)
Total Bilirubin: 0.5 mg/dL (ref 0.3–1.2)

## 2016-01-06 LAB — GLUCOSE, CAPILLARY
GLUCOSE-CAPILLARY: 81 mg/dL (ref 65–99)
Glucose-Capillary: 61 mg/dL — ABNORMAL LOW (ref 65–99)

## 2016-01-06 MED ORDER — DEXTROSE 5 % IV SOLN
2.0000 g | INTRAVENOUS | Status: DC
  Administered 2016-01-08: 2 g via INTRAVENOUS
  Filled 2016-01-06 (×5): qty 2

## 2016-01-06 MED ORDER — TACROLIMUS 0.5 MG PO CAPS
0.5000 mg | ORAL_CAPSULE | Freq: Two times a day (BID) | ORAL | Status: DC
  Administered 2016-01-06 – 2016-01-07 (×3): 0.5 mg via ORAL
  Filled 2016-01-06 (×10): qty 1

## 2016-01-06 MED ORDER — CALCIUM CARBONATE ANTACID 500 MG PO CHEW
2.0000 | CHEWABLE_TABLET | Freq: Three times a day (TID) | ORAL | Status: DC | PRN
  Administered 2016-01-06: 400 mg via ORAL

## 2016-01-06 NOTE — Progress Notes (Signed)
Positive blood cultures text paged to on call MD.

## 2016-01-06 NOTE — Progress Notes (Signed)
PHARMACY - PHYSICIAN COMMUNICATION CRITICAL VALUE ALERT - BLOOD CULTURE IDENTIFICATION (BCID)  Results for orders placed or performed during the hospital encounter of 01/05/16  Blood Culture ID Panel (Reflexed) (Collected: 01/05/2016 11:58 AM)  Result Value Ref Range   Enterococcus species NOT DETECTED NOT DETECTED   Listeria monocytogenes NOT DETECTED NOT DETECTED   Staphylococcus species NOT DETECTED NOT DETECTED   Staphylococcus aureus NOT DETECTED NOT DETECTED   Streptococcus species NOT DETECTED NOT DETECTED   Streptococcus agalactiae NOT DETECTED NOT DETECTED   Streptococcus pneumoniae NOT DETECTED NOT DETECTED   Streptococcus pyogenes NOT DETECTED NOT DETECTED   Acinetobacter baumannii NOT DETECTED NOT DETECTED   Enterobacteriaceae species DETECTED (A) NOT DETECTED   Enterobacter cloacae complex NOT DETECTED NOT DETECTED   Escherichia coli DETECTED (A) NOT DETECTED   Klebsiella oxytoca NOT DETECTED NOT DETECTED   Klebsiella pneumoniae NOT DETECTED NOT DETECTED   Proteus species NOT DETECTED NOT DETECTED   Serratia marcescens NOT DETECTED NOT DETECTED   Carbapenem resistance NOT DETECTED NOT DETECTED   Haemophilus influenzae NOT DETECTED NOT DETECTED   Neisseria meningitidis NOT DETECTED NOT DETECTED   Pseudomonas aeruginosa NOT DETECTED NOT DETECTED   Candida albicans NOT DETECTED NOT DETECTED   Candida glabrata NOT DETECTED NOT DETECTED   Candida krusei NOT DETECTED NOT DETECTED   Candida parapsilosis NOT DETECTED NOT DETECTED   Candida tropicalis NOT DETECTED NOT DETECTED    Name of physician (or Provider) Contacted: Dr Kerry HoughMemon  Changes to prescribed antibiotics required: Increase rocephin to 2gm IV q24 hours  Woodfin GanjaSeay, Arianah Torgeson [place] 01/06/2016  3:09 PM

## 2016-01-06 NOTE — Progress Notes (Signed)
Nutrition Follow-up  DOCUMENTATION CODES:  -Non-severe (moderate) malnutrition in the context of chronic illness (CKD stage 4); muscle and fat mass depletions;ongoing.  -Underweight  INTERVENTION:  CHO modified diet   Milk with all meals  Offer snack between meals from nourishment room   NUTRITION DIAGNOSIS:  Malnutrition related to chronic illness; ongoing. Moderate depletions of muscle and fat mass.    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 UA results indicate UTI per MD.  She has a hx of moderate malnutrition and is underweight. According to her weight hx usual body weight has fluctuated between 96-99# (44-45 kg) for the past 16 months. Patient was assessed by RD on 10/17/14 and her weight at that time 44 kg. She was reassessed on 12/14/15 by RD and has returned c/o that she has not eaten well for the past week. Her weight is down 3.2% over the past month which is trending toward significant.   Patient says she is a picky eater and she doesn't drink oral supplements such as Ensure. On her last admission she was taking carnation instant breakfast but now says she has tired of that. She is willing to drink whole milk with all her meals. Dietary staff has contacted her to obtain current meal preferences and will during her hospitalization so we can maximize her nutrition intake.    Recent Labs Lab 01/05/16 1156 01/06/16 0529  NA 135 137  K 4.0 4.1  CL 104 110  CO2 20* 21*  BUN 47* 41*  CREATININE 2.93* 2.57*  CALCIUM 8.5* 7.9*  GLUCOSE 83 80   Labs: chronic abnormal BUN and Creat. (CKD)  Nutrition-Focused physical exam completed. Findings are moderte fat  depletion, moderate muscle depletion, and no edema.     Diet Order:  Diet Carb Modified Fluid consistency: Thin; Room service appropriate? Yes  Skin:   intact  Last BM:   01/05/16  Height:   Ht Readings from Last 1 Encounters:  01/05/16 5\' 3"  (1.6 m)    Weight:   Wt Readings from Last 1 Encounters:  01/05/16 92 lb 9.6 oz (42 kg)    Ideal Body Weight:   52.2 kg  BMI:  Body mass index is 16.4 kg/m. underweight  Estimated Nutritional Needs:   Kcal:   1400-1700   Protein:   36-40 gr  Fluid:   1.3 liters daily  EDUCATION NEEDS: none at this time  Royann ShiversLynn Mykayla Brinton MS,RD,CSG,LDN Office: [phone] Pager: [phone]

## 2016-01-06 NOTE — Progress Notes (Addendum)
PROGRESS NOTE    [name]  JYN:[id number] DOB: 1940/11/28 DOA: 01/05/2016 PCP: Samuel Jester, DO    Brief Narrative:  55 yof with a hx of DM type 2, HTN, CKD, GERD, HLD, and S/p renal transplant presented with complaints of right flank pain. While being evaluated she was noted to be febrile with an elevated BUN of 47 and Cr 2.93. UA was indicative of infection. Pt was admitted for further evaluation of a UTI.    Assessment & Plan:   Principal Problem:   UTI (lower urinary tract infection) Active Problems:   Hyperlipidemia   Essential hypertension   Type 2 diabetes mellitus with stage 3 chronic kidney disease, with long-term current use of insulin ([facility])   1. UTI. UA indicative of infection. Urine cultures are pending. Continue IV rocephin. Check renal ultrasound to evaluate for any stones, since patient has had recurrent infections. 2. Anemia. Appears to be chronic, patient reports receiving aranesp and IV iron as an outpatient, which she did not receive prior to admission due to fever. Can consider administering tomorrow if stable 3. Thrombocytopenia. Possibly related to infectious process. 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 No evidence of bleeding. Continue to follow. Discontinue heparin for now and start scds 4. Fevers secondary to UTI. Continue antipyretics PRN.  5. HLD. Continue statins.  6. HTN. Stable.  7. DM type 2. Stable. Continue SSI. 8. GERD. Continue PPI.   9. CKD stage 4.  Creatinine improving with hydration. Approaching baseline. Continue to monitor renal function closely.  10. Left lung nodule. Incidental finding on chest xray. Will need follow up as an outpatient.   DVT prophylaxis: scds Code Status: Full Family Communication: discussed with family bedside Disposition Plan: Discharge home once improved   Consultants:   None   Procedures:   None   Antimicrobials:   Rocephin 8/23 >>   Subjective: Feeling better today. No vomiting.  No abdominal pain.  Objective: Vitals:   01/05/16 1727 01/05/16 2037 01/05/16 2100 01/06/16 0500  BP:   (!) 127/53 (!) 112/58  Pulse:   89 76  Resp:   18 18  Temp: 100.2 F (37.9 C)  100.1 F (37.8 C) 98 F (36.7 C)  TempSrc: Oral  Oral Oral  SpO2:  97% 96% 100%  Weight:      Height:        Intake/Output Summary (Last 24 hours) at 01/06/16 0608 Last data filed at 01/06/16 0458  Gross per 24 hour  Intake           1457.5 ml  Output                0 ml  Net           1457.5 ml   Filed Weights   01/05/16 1124 01/05/16 1515  Weight: 41.3 kg (91 lb) 42 kg (92 lb 9.6 oz)    Examination:  General exam: Appears calm and comfortable  Respiratory system: Clear to auscultation. Respiratory effort normal. Cardiovascular system: S1 & S2 heard, RRR. No JVD, murmurs, rubs, gallops or clicks. No pedal edema. Gastrointestinal system: Abdomen is nondistended, soft and nontender. No organomegaly or masses felt. Normal bowel sounds heard. Central nervous system: Alert and oriented. No focal neurological deficits. Extremities: Symmetric 5 x 5 power. Skin: No rashes, lesions or ulcers Psychiatry: Judgement and insight appear normal. Mood & affect appropriate.     Data Reviewed: I have personally reviewed following labs and imaging studies  CBC:  Recent  Labs Lab 01/05/16 1040 01/05/16 1146  WBC  --  5.9  NEUTROABS  --  4.4  HGB 11.5* 11.1*  HCT 34.9* 33.8*  MCV  --  90.9  PLT  --  93*   Basic Metabolic Panel:  Recent Labs Lab 01/05/16 1156  NA 135  K 4.0  CL 104  CO2 20*  GLUCOSE 83  BUN 47*  CREATININE 2.93*  CALCIUM 8.5*   GFR: Estimated Creatinine Clearance: 11.3 mL/min (by C-G formula based on SCr of 2.93 mg/dL). Liver Function Tests:  Recent Labs Lab 01/05/16 1156  AST 17  ALT 11*  ALKPHOS 99  BILITOT 1.4*  PROT 6.7  ALBUMIN 3.8    Recent Labs Lab 01/05/16 1156  LIPASE 11   No results for input(s): AMMONIA in the last 168 hours. Coagulation  Profile: No results for input(s): INR, PROTIME in the last 168 hours. Cardiac Enzymes: No results for input(s): CKTOTAL, CKMB, CKMBINDEX, TROPONINI in the last 168 hours. BNP (last 3 results) No results for input(s): PROBNP in the last 8760 hours. HbA1C: No results for input(s): HGBA1C in the last 72 hours. CBG:  Recent Labs Lab 01/05/16 1820 01/05/16 2014  GLUCAP 70 139*   Lipid Profile: No results for input(s): CHOL, HDL, LDLCALC, TRIG, CHOLHDL, LDLDIRECT in the last 72 hours. Thyroid Function Tests: No results for input(s): TSH, T4TOTAL, FREET4, T3FREE, THYROIDAB in the last 72 hours. Anemia Panel:  Recent Labs  01/05/16 1040  FERRITIN 3,787*  TIBC 171*  IRON 25*   Sepsis Labs:  Recent Labs Lab 01/05/16 1146 01/05/16 1453  LATICACIDVEN 0.8 0.6    Recent Results (from the past 240 hour(s))  Culture, blood (routine x 2)     Status: None (Preliminary result)   Collection Time: 01/05/16 11:46 AM  Result Value Ref Range Status   Specimen Description BLOOD RIGHT ANTECUBITAL  Final   Special Requests   Final    BOTTLES DRAWN AEROBIC AND ANAEROBIC AEB=14CC ANA=6CC   Culture  Setup Time   Final    GRAM NEGATIVE RODS AEB BOTTLE Gram Stain Report Called to,Read Back By and Verified With: HANDY T AT 0518 ON [id number] BY FORSYTH K    Culture PENDING  Incomplete   Report Status PENDING  Incomplete  Culture, blood (routine x 2)     Status: None (Preliminary result)   Collection Time: 01/05/16 11:58 AM  Result Value Ref Range Status   Specimen Description BLOOD LEFT HAND DRAWN BY RN  Final   Special Requests BOTTLES DRAWN AEROBIC ONLY 6CC  Final   Culture  Setup Time   Final    GRAM NEGATIVE RODS IN BOTH BOTTLES Gram Stain Report Called to,Read Back By and Verified With: HANDY T AT 0518 ON [id number] BY FORSYTH K    Culture PENDING  Incomplete   Report Status PENDING  Incomplete         Radiology Studies: Dg Chest 2 View  Result Date: 01/05/2016 CLINICAL DATA:   Fever and fatigue. EXAM: CHEST  2 VIEW COMPARISON:  12/12/2015 FINDINGS: The heart size and mediastinal contours are within normal limits. 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 There is a stable 10 mm nodular density overlying the superior left hemithorax. Both lungs are otherwise clear. The visualized skeletal structures are unremarkable. Cholecystectomy clips are noted in the upper abdomen. IMPRESSION: No active cardiopulmonary disease. Stable 10 mm nodular density in the left upper lung field. 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 Attention  on follow-up is recommended. Electronically Signed   By: Ted Mcalpineobrinka  Dimitrova M.D.   On: 01/05/2016 14:07        Scheduled Meds: . aspirin EC  81 mg Oral QHS  . cefTRIAXone (ROCEPHIN)  IV  1 g Intravenous Q24H  . clonazePAM  1 mg Oral QHS  . dicyclomine  10 mg Oral TID AC  . fluconazole  100 mg Oral Daily  . heparin  5,000 Units Subcutaneous Q8H  . insulin aspart  0-5 Units Subcutaneous QHS  . insulin aspart  0-9 Units Subcutaneous TID WC  . levothyroxine  75 mcg Oral QAC breakfast  . magnesium oxide  400 mg Oral Daily  . metoprolol  50 mg Oral BID  . mycophenolate  250 mg Oral BID  . pantoprazole  40 mg Oral Daily  . sodium chloride flush  3 mL Intravenous Q12H  . tacrolimus  1 mg Oral Daily   Continuous Infusions: . sodium chloride 100 mL/hr at 01/05/16 1330  . sodium chloride 75 mL/hr at 01/05/16 1828     LOS: 1 day    Time spent: 25 minutes     Erick BlinksJehanzeb Memon, MD [facility] If 7PM-7AM, please contact night-coverage [email or web address] Password [email or web address] 01/06/2016, 6:08 AM

## 2016-01-07 DIAGNOSIS — R7881 Bacteremia: Secondary | ICD-10-CM

## 2016-01-07 LAB — BASIC METABOLIC PANEL
Anion gap: 6 (ref 5–15)
BUN: 34 mg/dL — AB (ref 6–20)
CHLORIDE: 112 mmol/L — AB (ref 101–111)
CO2: 22 mmol/L (ref 22–32)
Calcium: 8.5 mg/dL — ABNORMAL LOW (ref 8.9–10.3)
Creatinine, Ser: 2.11 mg/dL — ABNORMAL HIGH (ref 0.44–1.00)
GFR calc Af Amer: 26 mL/min — ABNORMAL LOW (ref >60)
GFR calc non Af Amer: 22 mL/min — ABNORMAL LOW (ref >60)
Glucose, Bld: 70 mg/dL (ref 65–99)
POTASSIUM: 4.1 mmol/L (ref 3.5–5.1)
SODIUM: 140 mmol/L (ref 135–145)

## 2016-01-07 LAB — CBC
HCT: 29.5 % — ABNORMAL LOW (ref 36.0–46.0)
HEMOGLOBIN: 9.8 g/dL — AB (ref 12.0–15.0)
MCH: 30.1 pg (ref 26.0–34.0)
MCHC: 33.2 g/dL (ref 30.0–36.0)
MCV: 90.5 fL (ref 78.0–100.0)
Platelets: 96 10*3/uL — ABNORMAL LOW (ref 150–400)
RBC: 3.26 MIL/uL — AB (ref 3.87–5.11)
RDW: 16.2 % — ABNORMAL HIGH (ref 11.5–15.5)
WBC: 2.4 10*3/uL — ABNORMAL LOW (ref 4.0–10.5)

## 2016-01-07 LAB — URINE CULTURE

## 2016-01-07 LAB — GLUCOSE, CAPILLARY
GLUCOSE-CAPILLARY: 90 mg/dL (ref 65–99)
GLUCOSE-CAPILLARY: 125 mg/dL — AB (ref 65–99)
GLUCOSE-CAPILLARY: 77 mg/dL (ref 65–99)
GLUCOSE-CAPILLARY: 140 mg/dL — AB (ref 65–99)
Glucose-Capillary: 130 mg/dL — ABNORMAL HIGH (ref 65–99)

## 2016-01-07 NOTE — Progress Notes (Signed)
PROGRESS NOTE    [name]  ZOX:[id number] DOB: 08-20-1940 DOA: 01/05/2016 PCP: Samuel Jester, DO   Brief Narrative:  70 yof with a hx of DM type 2, HTN, CKD, GERD, HLD, and S/p renal transplant presented with complaints of right flank pain. While being evaluated she was noted to be febrile with an elevated BUN of 47 and Cr 2.93. UA was indicative of infection. Pt was admitted for further evaluation of a UTI.   Assessment & Plan:   Principal Problem:   UTI (lower urinary tract infection) Active Problems:   Hyperlipidemia   Anemia   Essential hypertension   Hypothyroidism   Type 2 diabetes mellitus with stage 3 chronic kidney disease, with long-term current use of insulin (HCC)   CKD (chronic kidney disease) stage 4, GFR 15-29 ml/min (HCC)   Thrombocytopenia (HCC)   Nodule of left lung   Gram-negative bacteremia (HCC) 1. E Coli UTI with associated bacteremia. UA indicative of infection. Urine cultures and blood cultures show E coli. Continue IV rocephin. Follow up sensitivities. Renal ultrasound unremarkable. 2. Anemia. Appears to be chronic, patient reports receiving aranesp and IV iron as an outpatient, which she did not receive prior to admission due to fever. Hemoglobin currently stable.  3. Thrombocytopenia. Possibly related to infectious process. 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 No evidence of bleeding. Continue to follow. Heparin has been discontinued. Continue SCDs. Overall improving. 4. Fevers secondary to UTI. Continue antipyretics PRN.  5. HLD. Continue statins.  6. HTN. Remains stable.  7. DM type 2. Remains stable. Continue SSI. 8. GERD. Continue PPI.   9. CKD stage 4.  Creatinine improving with hydration. Approaching baseline. Continue to monitor renal function closely.  10. Left lung nodule. Incidental finding on chest xray. Will need follow up as an outpatient.    DVT prophylaxis: SCDs Code Status: Full Family Communication: discussed with  sister at the bedside Disposition Plan: Discharge home once improved   Consultants:   None   Procedures:   None   Antimicrobials:   Rocephin 8/23 >>   Subjective: Feeling better. Able to ambulate around the room.  Objective: Vitals:   01/06/16 1443 01/06/16 1944 01/06/16 2013 01/07/16 0520  BP: 129/70 (!) 124/46  (!) 131/49  Pulse: 82 87  72  Resp: 18 20  20   Temp: 98.2 F (36.8 C) 98.9 F (37.2 C)  98.2 F (36.8 C)  TempSrc: Oral Oral  Oral  SpO2: 99% 99% 96% 99%  Weight:      Height:        Intake/Output Summary (Last 24 hours) at 01/07/16 0747 Last data filed at 01/07/16 0521  Gross per 24 hour  Intake              840 ml  Output             2300 ml  Net            -1460 ml   Filed Weights   01/05/16 1124 01/05/16 1515  Weight: 41.3 kg (91 lb) 42 kg (92 lb 9.6 oz)    Examination:  General exam: Appears calm and comfortable  Respiratory system: Clear to auscultation. Respiratory effort normal. Cardiovascular system: S1 & S2 heard, RRR. No JVD, murmurs, rubs, gallops or clicks. No pedal edema. Gastrointestinal system: Abdomen is nondistended, soft and nontender. No organomegaly or masses felt. Normal bowel sounds heard. Central nervous system: Alert and oriented. No focal neurological deficits. Extremities: Symmetric 5 x 5 power.  Skin: No rashes, lesions or ulcers Psychiatry: Judgement and insight appear normal. Mood & affect appropriate.     Data Reviewed: I have personally reviewed following labs and imaging studies  CBC:  Recent Labs Lab 01/05/16 1040 01/05/16 1146 01/06/16 0529 01/07/16 0443  WBC  --  5.9 2.7* 2.4*  NEUTROABS  --  4.4  --   --   HGB 11.5* 11.1* 8.8* 9.8*  HCT 34.9* 33.8* 27.2* 29.5*  MCV  --  90.9 93.2 90.5  PLT  --  93* 73* 96*   Basic Metabolic Panel:  Recent Labs Lab 01/05/16 1156 01/06/16 0529 01/07/16 0443  NA 135 137 140  K 4.0 4.1 4.1  CL 104 110 112*  CO2 20* 21* 22  GLUCOSE 83 80 70  BUN 47* 41*  34*  CREATININE 2.93* 2.57* 2.11*  CALCIUM 8.5* 7.9* 8.5*   GFR: Estimated Creatinine Clearance: 15.7 mL/min (by C-G formula based on SCr of 2.11 mg/dL). Liver Function Tests:  Recent Labs Lab 01/05/16 1156 01/06/16 0529  AST 17 11*  ALT 11* 8*  ALKPHOS 99 72  BILITOT 1.4* 0.5  PROT 6.7 5.1*  ALBUMIN 3.8 2.8*    Recent Labs Lab 01/05/16 1156  LIPASE 11   No results for input(s): AMMONIA in the last 168 hours. Coagulation Profile: No results for input(s): INR, PROTIME in the last 168 hours. Cardiac Enzymes: No results for input(s): CKTOTAL, CKMB, CKMBINDEX, TROPONINI in the last 168 hours. BNP (last 3 results) No results for input(s): PROBNP in the last 8760 hours. HbA1C: No results for input(s): HGBA1C in the last 72 hours. CBG:  Recent Labs Lab 01/05/16 1820 01/05/16 2014 01/06/16 0735 01/06/16 1204  GLUCAP 70 139* 61* 81   Lipid Profile: No results for input(s): CHOL, HDL, LDLCALC, TRIG, CHOLHDL, LDLDIRECT in the last 72 hours. Thyroid Function Tests: No results for input(s): TSH, T4TOTAL, FREET4, T3FREE, THYROIDAB in the last 72 hours. Anemia Panel:  Recent Labs  01/05/16 1040  FERRITIN 3,787*  TIBC 171*  IRON 25*   Sepsis Labs:  Recent Labs Lab 01/05/16 1146 01/05/16 1453  LATICACIDVEN 0.8 0.6    Recent Results (from the past 240 hour(s))  Culture, blood (routine x 2)     Status: None (Preliminary result)   Collection Time: 01/05/16 11:46 AM  Result Value Ref Range Status   Specimen Description BLOOD LEFT HAND DRAWN BY RN  Final   Special Requests BOTTLES DRAWN AEROBIC ONLY 6CC  Final   Culture  Setup Time   Final    GRAM NEGATIVE RODS AEB BOTTLE Gram Stain Report Called to,Read Back By and Verified With: HANDY T AT 0518 ON [id number] BY FORSYTH K    Culture PENDING  Incomplete   Report Status PENDING  Incomplete  Culture, blood (routine x 2)     Status: None (Preliminary result)   Collection Time: 01/05/16 11:58 AM  Result Value Ref  Range Status   Specimen Description BLOOD RIGHT ANTECUBITAL  Final   Special Requests   Final    BOTTLES DRAWN AEROBIC AND ANAEROBIC AEB=14CC ANA=6CC   Culture  Setup Time   Final    GRAM NEGATIVE RODS IN BOTH BOTTLES Gram Stain Report Called to,Read Back By and Verified With: HANDY T AT 0518 ON [id number] BY FORSYTH K Organism ID to follow CRITICAL RESULT CALLED TO, READ BACK BY AND VERIFIED WITH: L SEAY 01/06/16 @ 1500 M VESTAL [facility]    Culture GRAM NEGATIVE RODS  Final   Report Status PENDING  Incomplete  Blood Culture ID Panel (Reflexed)     Status: Abnormal   Collection Time: 01/05/16 11:58 AM  Result Value Ref Range Status   Enterococcus species NOT DETECTED NOT DETECTED Corrected   Listeria monocytogenes NOT DETECTED NOT DETECTED Corrected   Staphylococcus species NOT DETECTED NOT DETECTED Corrected   Staphylococcus aureus NOT DETECTED NOT DETECTED Corrected   Streptococcus species NOT DETECTED NOT DETECTED Corrected   Streptococcus agalactiae NOT DETECTED NOT DETECTED Corrected   Streptococcus pneumoniae NOT DETECTED NOT DETECTED Corrected   Streptococcus pyogenes NOT DETECTED NOT DETECTED Corrected   Acinetobacter baumannii NOT DETECTED NOT DETECTED Corrected   Enterobacteriaceae species DETECTED (A) NOT DETECTED Corrected    Comment: CRITICAL RESULT CALLED TO, READ BACK BY AND VERIFIED WITH: L SEAY 01/06/16 @ 1500 M VESTAL    Enterobacter cloacae complex NOT DETECTED NOT DETECTED Corrected   Escherichia coli DETECTED (A) NOT DETECTED Corrected    Comment: CRITICAL RESULT CALLED TO, READ BACK BY AND VERIFIED WITH: L SEAY 01/06/16 @ 1500 M VESTAL    Klebsiella oxytoca NOT DETECTED NOT DETECTED Corrected   Klebsiella pneumoniae NOT DETECTED NOT DETECTED Corrected   Proteus species NOT DETECTED NOT DETECTED Corrected   Serratia marcescens NOT DETECTED NOT DETECTED Corrected   Carbapenem resistance NOT DETECTED NOT DETECTED Corrected   Haemophilus  influenzae NOT DETECTED NOT DETECTED Corrected   Neisseria meningitidis NOT DETECTED NOT DETECTED Corrected   Pseudomonas aeruginosa NOT DETECTED NOT DETECTED Corrected   Candida albicans NOT DETECTED NOT DETECTED Corrected   Candida glabrata NOT DETECTED NOT DETECTED Corrected   Candida krusei NOT DETECTED NOT DETECTED Corrected   Candida parapsilosis NOT DETECTED NOT DETECTED Corrected   Candida tropicalis NOT DETECTED NOT DETECTED Corrected    Comment: Performed at [facility]  Urine culture     Status: Abnormal (Preliminary result)   Collection Time: 01/05/16 12:00 PM  Result Value Ref Range Status   Specimen Description URINE, CLEAN CATCH  Final   Special Requests NONE  Final   Culture >=100,000 COLONIES/mL ESCHERICHIA COLI (A)  Final   Report Status PENDING  Incomplete         Radiology Studies: Dg Chest 2 View  Result Date: 01/05/2016 CLINICAL DATA:  Fever and fatigue. EXAM: CHEST  2 VIEW COMPARISON:  12/12/2015 FINDINGS: The heart size and mediastinal contours are within normal limits. 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 There is a stable 10 mm nodular density overlying the superior left hemithorax. Both lungs are otherwise clear. The visualized skeletal structures are unremarkable. Cholecystectomy clips are noted in the upper abdomen. IMPRESSION: No active cardiopulmonary disease. Stable 10 mm nodular density in the left upper lung field. 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 Attention on follow-up is recommended. Electronically Signed   By: Ted Mcalpine M.D.   On: 01/05/2016 14:07   US Renal Transplant W/doppler  Result Date: 01/06/2016 CLINICAL DATA:  Recurrent urinary tract infections and history of prior renal transplantation. EXAM: ULTRASOUND OF RENAL TRANSPLANT WITH RENAL DOPPLER ULTRASOUND TECHNIQUE: Ultrasound examination of the renal transplant was performed with gray-scale, color and duplex doppler  evaluation. COMPARISON:  None. FINDINGS: Transplant kidney location: Right iliac fossa Transplant Kidney: Length: 11.8 cm. Normal in size and parenchymal echogenicity. No evidence of mass or hydronephrosis. No peri-transplant fluid collection seen. Color flow in the main renal artery:  Yes Color flow in the main renal vein:  Yes Duplex Doppler Evaluation: Main  Renal Artery Resistive Index: 0.84 Venous waveform in main renal vein:  Present. Intrarenal resistive index in upper pole:  0.58 (normal 0.6-0.8; equivocal 0.8-0.9; abnormal >= 0.9) Intrarenal resistive index in lower pole: 0.79 (normal 0.6-0.8; equivocal 0.8-0.9; abnormal >= 0.9) Bladder: Normal for degree of bladder distention. Other findings: No abnormal fluid collections surrounding the transplanted kidney. IMPRESSION: Unremarkable transplant kidney ultrasound demonstrating no evidence of a transplant hydronephrosis, abnormal resistance indices or surrounding fluid collections. Electronically Signed   By: Irish LackGlenn  Yamagata M.D.   On: 01/06/2016 15:14        Scheduled Meds: . aspirin EC  81 mg Oral QHS  . cefTRIAXone (ROCEPHIN)  IV  2 g Intravenous Q24H  . clonazePAM  1 mg Oral QHS  . dicyclomine  10 mg Oral TID AC  . fluconazole  100 mg Oral Daily  . insulin aspart  0-9 Units Subcutaneous TID WC  . levothyroxine  75 mcg Oral QAC breakfast  . magnesium oxide  400 mg Oral Daily  . metoprolol  50 mg Oral BID  . mycophenolate  250 mg Oral BID  . pantoprazole  40 mg Oral Daily  . sodium chloride flush  3 mL Intravenous Q12H  . tacrolimus  0.5 mg Oral BID   Continuous Infusions:    LOS: 2 days    Time spent: 25 minutes     Erick BlinksJehanzeb Memon, MD [facility] If 7PM-7AM, please contact night-coverage [email or web address] Password [email or web address] 01/07/2016, 7:47 AM

## 2016-01-08 ENCOUNTER — Encounter: Payer: Self-pay | Admitting: Family

## 2016-01-08 LAB — CULTURE, BLOOD (ROUTINE X 2)

## 2016-01-08 LAB — GLUCOSE, CAPILLARY
GLUCOSE-CAPILLARY: 105 mg/dL — AB (ref 65–99)
Glucose-Capillary: 115 mg/dL — ABNORMAL HIGH (ref 65–99)
Glucose-Capillary: 86 mg/dL (ref 65–99)
Glucose-Capillary: 138 mg/dL — ABNORMAL HIGH (ref 65–99)
Glucose-Capillary: 109 mg/dL — ABNORMAL HIGH (ref 65–99)
Glucose-Capillary: 139 mg/dL — ABNORMAL HIGH (ref 65–99)

## 2016-01-08 MED ORDER — TACROLIMUS 1 MG PO CAPS
1.0000 mg | ORAL_CAPSULE | Freq: Every day | ORAL | Status: DC
  Administered 2016-01-08 – 2016-01-09 (×2): 1 mg via ORAL
  Filled 2016-01-08 (×4): qty 1

## 2016-01-08 MED ORDER — TACROLIMUS 1 MG PO CAPS
1.0000 mg | ORAL_CAPSULE | Freq: Every day | ORAL | Status: DC
  Filled 2016-01-08 (×4): qty 1

## 2016-01-08 NOTE — Care Management Important Message (Signed)
Important Message  Patient Details  Name: Deanna ShortsLinda T Hagenow MRN: [id number] Date of Birth: 1941/01/28   Medicare Important Message Given:  Yes    Tewana Bohlen, Chrystine OilerSharley Diane, RN 01/08/2016, 9:01 AM

## 2016-01-08 NOTE — Progress Notes (Signed)
PROGRESS NOTE    [name]  ZOX:[id number] DOB: 10/24/1940 DOA: 01/05/2016 PCP: Samuel Jester, DO    Brief Narrative:  47 yof with a hx of DM type 2, HTN, CKD, GERD, HLD, and S/p renal transplant presented with complaints of right flank pain. While being evaluated she was noted to be febrile with an elevated BUN of 47 and Cr 2.93. UA was indicative of infection. Pt was admitted for further evaluation of a UTI.   Assessment & Plan:   Principal Problem:   UTI (lower urinary tract infection) Active Problems:   Hyperlipidemia   Anemia   Essential hypertension   Hypothyroidism   Type 2 diabetes mellitus with stage 3 chronic kidney disease, with long-term current use of insulin (HCC)   CKD (chronic kidney disease) stage 4, GFR 15-29 ml/min (HCC)   Thrombocytopenia (HCC)   Nodule of left lung   Gram-negative bacteremia (HCC) 1. E Coli UTI with associated bacteremia. UA indicative of infection. Urine cultures and blood cultures show E coli. Continue IV rocephin. Follow up sensitivities. Renal ultrasound unremarkable 2. Anemia. Appears to be chronic, patient reports receiving aranesp and IV iron as an outpatient, which she did not receive prior to admission due to fever. Hemoglobin remains stable. Continue to follow 3. Thrombocytopenia. Possibly related to infectious process. 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 No evidence of bleeding. Continue to follow. Heparin has been discontinued. Continue SCDs. Continues to improve. 4. HLD. Continue statins.  5. HTN. Remains stable.  6. DM type 2. Remains stable. Continue SSI. 7. GERD. Continue PPI.  8. CKD stage 4. Creatinine improving with hydration. Approaching baseline. Continue to monitor renal function closely.  9. Left lung nodule. Incidental finding on chest xray. Will need follow up as an outpatient.   DVT prophylaxis: SCDs Code Status: Full Family Communication: No family bedside Disposition Plan: Discharge home once  improved   Consultants:   None  Procedures:  None  Antimicrobials:   Rocephin 8/23 >>   Subjective: No shortness of breath or abdominal pain  Objective: Vitals:   01/06/16 2013 01/07/16 0520 01/07/16 0939 01/07/16 2037  BP:  (!) 131/49 132/63 130/71  Pulse:  72 83 72  Resp:  20    Temp:  98.2 F (36.8 C)  98.3 F (36.8 C)  TempSrc:  Oral  Oral  SpO2: 96% 99%  98%  Weight:      Height:        Intake/Output Summary (Last 24 hours) at 01/08/16 0620 Last data filed at 01/07/16 1823  Gross per 24 hour  Intake              480 ml  Output              800 ml  Net             -320 ml   Filed Weights   01/05/16 1124 01/05/16 1515  Weight: 41.3 kg (91 lb) 42 kg (92 lb 9.6 oz)    Examination:  General exam: Appears calm and comfortable  Respiratory system: Clear to auscultation. Respiratory effort normal. Cardiovascular system: S1 & S2 heard, RRR. No JVD, murmurs, rubs, gallops or clicks. No pedal edema. Gastrointestinal system: Abdomen is nondistended, soft and nontender. No organomegaly or masses felt. Normal bowel sounds heard. Central nervous system: Alert and oriented. No focal neurological deficits. Extremities: Symmetric 5 x 5 power. Skin: No rashes, lesions or ulcers Psychiatry: Judgement and insight appear normal. Mood & affect appropriate.  Data Reviewed: I have personally reviewed following labs and imaging studies  CBC:  Recent Labs Lab 01/05/16 1040 01/05/16 1146 01/06/16 0529 01/07/16 0443  WBC  --  5.9 2.7* 2.4*  NEUTROABS  --  4.4  --   --   HGB 11.5* 11.1* 8.8* 9.8*  HCT 34.9* 33.8* 27.2* 29.5*  MCV  --  90.9 93.2 90.5  PLT  --  93* 73* 96*   Basic Metabolic Panel:  Recent Labs Lab 01/05/16 1156 01/06/16 0529 01/07/16 0443  NA 135 137 140  K 4.0 4.1 4.1  CL 104 110 112*  CO2 20* 21* 22  GLUCOSE 83 80 70  BUN 47* 41* 34*  CREATININE 2.93* 2.57* 2.11*  CALCIUM 8.5* 7.9* 8.5*   GFR: Estimated Creatinine Clearance: 15.7  mL/min (by C-G formula based on SCr of 2.11 mg/dL). Liver Function Tests:  Recent Labs Lab 01/05/16 1156 01/06/16 0529  AST 17 11*  ALT 11* 8*  ALKPHOS 99 72  BILITOT 1.4* 0.5  PROT 6.7 5.1*  ALBUMIN 3.8 2.8*    Recent Labs Lab 01/05/16 1156  LIPASE 11   No results for input(s): AMMONIA in the last 168 hours. Coagulation Profile: No results for input(s): INR, PROTIME in the last 168 hours. Cardiac Enzymes: No results for input(s): CKTOTAL, CKMB, CKMBINDEX, TROPONINI in the last 168 hours. BNP (last 3 results) No results for input(s): PROBNP in the last 8760 hours. HbA1C: No results for input(s): HGBA1C in the last 72 hours. CBG:  Recent Labs Lab 01/06/16 1601 01/06/16 2026 01/07/16 0741 01/07/16 1152 01/07/16 1545  GLUCAP 90 125* 77 130* 140*   Lipid Profile: No results for input(s): CHOL, HDL, LDLCALC, TRIG, CHOLHDL, LDLDIRECT in the last 72 hours. Thyroid Function Tests: No results for input(s): TSH, T4TOTAL, FREET4, T3FREE, THYROIDAB in the last 72 hours. Anemia Panel:  Recent Labs  01/05/16 1040  FERRITIN 3,787*  TIBC 171*  IRON 25*   Sepsis Labs:  Recent Labs Lab 01/05/16 1146 01/05/16 1453  LATICACIDVEN 0.8 0.6    Recent Results (from the past 240 hour(s))  Culture, blood (routine x 2)     Status: None (Preliminary result)   Collection Time: 01/05/16 11:46 AM  Result Value Ref Range Status   Specimen Description BLOOD LEFT HAND DRAWN BY RN  Final   Special Requests BOTTLES DRAWN AEROBIC ONLY 6CC  Final   Culture  Setup Time   Final    GRAM NEGATIVE RODS AEB BOTTLE Gram Stain Report Called to,Read Back By and Verified With: HANDY T AT 0518 ON [id number] BY FORSYTH K    Culture GRAM NEGATIVE RODS  Final   Report Status PENDING  Incomplete  Culture, blood (routine x 2)     Status: Abnormal (Preliminary result)   Collection Time: 01/05/16 11:58 AM  Result Value Ref Range Status   Specimen Description BLOOD RIGHT ANTECUBITAL  Final   Special  Requests   Final    BOTTLES DRAWN AEROBIC AND ANAEROBIC AEB=14CC ANA=6CC   Culture  Setup Time   Final    GRAM NEGATIVE RODS IN BOTH BOTTLES Gram Stain Report Called to,Read Back By and Verified With: HANDY T AT 0518 ON [id number] BY FORSYTH K Organism ID to follow CRITICAL RESULT CALLED TO, READ BACK BY AND VERIFIED WITH: L SEAY 01/06/16 @ 1500 M VESTAL    Culture (A)  Final    ESCHERICHIA COLI SUSCEPTIBILITIES TO FOLLOW Performed at [facility]    Report Status PENDING  Incomplete  Blood Culture ID Panel (Reflexed)     Status: Abnormal   Collection Time: 01/05/16 11:58 AM  Result Value Ref Range Status   Enterococcus species NOT DETECTED NOT DETECTED Corrected   Listeria monocytogenes NOT DETECTED NOT DETECTED Corrected   Staphylococcus species NOT DETECTED NOT DETECTED Corrected   Staphylococcus aureus NOT DETECTED NOT DETECTED Corrected   Streptococcus species NOT DETECTED NOT DETECTED Corrected   Streptococcus agalactiae NOT DETECTED NOT DETECTED Corrected   Streptococcus pneumoniae NOT DETECTED NOT DETECTED Corrected   Streptococcus pyogenes NOT DETECTED NOT DETECTED Corrected   Acinetobacter baumannii NOT DETECTED NOT DETECTED Corrected   Enterobacteriaceae species DETECTED (A) NOT DETECTED Corrected    Comment: CRITICAL RESULT CALLED TO, READ BACK BY AND VERIFIED WITH: L SEAY 01/06/16 @ 1500 M VESTAL    Enterobacter cloacae complex NOT DETECTED NOT DETECTED Corrected   Escherichia coli DETECTED (A) NOT DETECTED Corrected    Comment: CRITICAL RESULT CALLED TO, READ BACK BY AND VERIFIED WITH: L SEAY 01/06/16 @ 1500 M VESTAL    Klebsiella oxytoca NOT DETECTED NOT DETECTED Corrected   Klebsiella pneumoniae NOT DETECTED NOT DETECTED Corrected   Proteus species NOT DETECTED NOT DETECTED Corrected   Serratia marcescens NOT DETECTED NOT DETECTED Corrected   Carbapenem resistance NOT DETECTED NOT DETECTED Corrected   Haemophilus influenzae NOT DETECTED NOT DETECTED Corrected     Neisseria meningitidis NOT DETECTED NOT DETECTED Corrected   Pseudomonas aeruginosa NOT DETECTED NOT DETECTED Corrected   Candida albicans NOT DETECTED NOT DETECTED Corrected   Candida glabrata NOT DETECTED NOT DETECTED Corrected   Candida krusei NOT DETECTED NOT DETECTED Corrected   Candida parapsilosis NOT DETECTED NOT DETECTED Corrected   Candida tropicalis NOT DETECTED NOT DETECTED Corrected    Comment: Performed at [facility]  Urine culture     Status: Abnormal   Collection Time: 01/05/16 12:00 PM  Result Value Ref Range Status   Specimen Description URINE, CLEAN CATCH  Final   Special Requests NONE  Final   Culture >=100,000 COLONIES/mL ESCHERICHIA COLI (A)  Final   Report Status 01/07/2016 FINAL  Final   Organism ID, Bacteria ESCHERICHIA COLI (A)  Final      Susceptibility   Escherichia coli - MIC*    AMPICILLIN >=32 RESISTANT Resistant     CEFAZOLIN <=4 SENSITIVE Sensitive     CEFTRIAXONE <=1 SENSITIVE Sensitive     CIPROFLOXACIN <=0.25 SENSITIVE Sensitive     GENTAMICIN <=1 SENSITIVE Sensitive     IMIPENEM <=0.25 SENSITIVE Sensitive     NITROFURANTOIN <=16 SENSITIVE Sensitive     TRIMETH/SULFA >=320 RESISTANT Resistant     AMPICILLIN/SULBACTAM 16 INTERMEDIATE Intermediate     PIP/TAZO <=4 SENSITIVE Sensitive     Extended ESBL NEGATIVE Sensitive     * >=100,000 COLONIES/mL ESCHERICHIA COLI         Radiology Studies: US Renal Transplant W/doppler  Result Date: 01/06/2016 CLINICAL DATA:  Recurrent urinary tract infections and history of prior renal transplantation. EXAM: ULTRASOUND OF RENAL TRANSPLANT WITH RENAL DOPPLER ULTRASOUND TECHNIQUE: Ultrasound examination of the renal transplant was performed with gray-scale, color and duplex doppler evaluation. COMPARISON:  None. FINDINGS: Transplant kidney location: Right iliac fossa Transplant Kidney: Length: 11.8 cm. Normal in size and parenchymal echogenicity. No evidence of mass or hydronephrosis. No  peri-transplant fluid collection seen. Color flow in the main renal artery:  Yes Color flow in the main renal vein:  Yes Duplex Doppler Evaluation: Main Renal Artery Resistive Index: 0.84 Venous waveform in main renal  vein:  Present. Intrarenal resistive index in upper pole:  0.58 (normal 0.6-0.8; equivocal 0.8-0.9; abnormal >= 0.9) Intrarenal resistive index in lower pole: 0.79 (normal 0.6-0.8; equivocal 0.8-0.9; abnormal >= 0.9) Bladder: Normal for degree of bladder distention. Other findings: No abnormal fluid collections surrounding the transplanted kidney. IMPRESSION: Unremarkable transplant kidney ultrasound demonstrating no evidence of a transplant hydronephrosis, abnormal resistance indices or surrounding fluid collections. Electronically Signed   By: Irish LackGlenn  Yamagata M.D.   On: 01/06/2016 15:14        Scheduled Meds: . aspirin EC  81 mg Oral QHS  . cefTRIAXone (ROCEPHIN)  IV  2 g Intravenous Q24H  . clonazePAM  1 mg Oral QHS  . dicyclomine  10 mg Oral TID AC  . fluconazole  100 mg Oral Daily  . insulin aspart  0-9 Units Subcutaneous TID WC  . levothyroxine  75 mcg Oral QAC breakfast  . magnesium oxide  400 mg Oral Daily  . metoprolol  50 mg Oral BID  . mycophenolate  250 mg Oral BID  . pantoprazole  40 mg Oral Daily  . sodium chloride flush  3 mL Intravenous Q12H  . tacrolimus  0.5 mg Oral BID   Continuous Infusions:    LOS: 3 days    Time spent: 25 minutes     Erick BlinksJehanzeb Memon, MD [facility] If 7PM-7AM, please contact night-coverage [email or web address] Password [email or web address] 01/08/2016, 6:20 AM

## 2016-01-09 LAB — GLUCOSE, CAPILLARY: GLUCOSE-CAPILLARY: 100 mg/dL — AB (ref 65–99)

## 2016-01-09 MED ORDER — CIPROFLOXACIN HCL 500 MG PO TABS: 500.0000 mg | ORAL_TABLET | Freq: Every day | ORAL | 0 refills | Status: DC

## 2016-01-09 MED ORDER — CIPROFLOXACIN HCL 250 MG PO TABS
500.0000 mg | ORAL_TABLET | ORAL | Status: AC
  Administered 2016-01-09: 500 mg via ORAL
  Filled 2016-01-09: qty 2

## 2016-01-09 NOTE — Progress Notes (Signed)
Patient states understanding of discharge instructions.  

## 2016-01-09 NOTE — Discharge Summary (Signed)
Physician Discharge Summary  [name] JDY:[id number] DOB: [birth year] DOA: 01/05/2016  PCP: Deanna Graves, DO  Admit date: 01/05/2016 Discharge date: 01/09/2016  Admitted From: home Disposition:  home  Recommendations for Outpatient Follow-up:  1. Follow up with PCP in 1-2 weeks 2. Please obtain BMP/CBC in one week 3. Follow up with nephrology as scheduled. Call short stay to reschedule procrit and iron infusions 4. 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 She presents to the hospital with complaints of right flank pain. She was also noted to be febrile with worsening renal function. Urinalysis indicated infection. She was started on intravenous Rocephin. Urine culture ultimately grew out Escherichia coli. She also had positive blood cultures for Escherichia coli. She was treated appropriately with Rocephin and has since improved. Her fevers have resolved. Renal function has returned to baseline with hydration. Based on culture sensitivities, she'll be transitioned to ciprofloxacin to complete her course.  She was noted to have significant thrombocytopenia which was felt to be reactive due to her underlying bacteremia. With treatment, this has started to improve.  Incidentally, she was found to have a left lung nodule. She will need follow-up imaging in 4 weeks.  Discharge Diagnoses:  Principal Problem:   UTI (lower urinary tract infection) Active Problems:   Hyperlipidemia   Anemia   Essential hypertension   Hypothyroidism   Type 2 diabetes mellitus with stage 3 chronic kidney disease, with long-term current use of insulin (HCC)   CKD (chronic kidney disease)  stage 4, GFR 15-29 ml/min (HCC)   Thrombocytopenia (HCC)   Nodule of left lung   Gram-negative bacteremia [facility])    Discharge Instructions  Discharge Instructions    Diet - low sodium heart healthy    Complete by:  As directed   Increase activity slowly    Complete by:  As directed       Medication List    TAKE these medications   ACCU-CHEK AVIVA PLUS w/Device Kit Use as directed 2 x daily   aspirin 81 MG tablet Take 81 mg by mouth at bedtime.   ciprofloxacin 500 MG tablet Commonly known as:  CIPRO Take 1 tablet (500 mg total) by mouth daily with breakfast.   clonazePAM 1 MG tablet Commonly known as:  KLONOPIN Take 1 mg by mouth at bedtime.   dicyclomine 10 MG capsule Commonly known as:  BENTYL Take 10 mg by mouth 3 (three) times daily before meals.   fluconazole 100 MG tablet Commonly known as:  DIFLUCAN Take 100 mg by mouth daily.   glucose blood test strip Commonly known as:  ACCU-CHEK AVIVA Use as instructed bid   hyoscyamine 0.125 MG tablet Commonly known as:  LEVSIN, ANASPAZ Take 0.125 mg by mouth every 4 (four) hours as needed for cramping.   levothyroxine 75 MCG tablet Commonly known as:  SYNTHROID, LEVOTHROID Take 75 mcg by mouth daily before breakfast.   magnesium oxide 400 MG tablet Commonly known as:  MAG-OX Take 400 mg by mouth daily.   metoprolol 50 MG tablet Commonly known as:  LOPRESSOR Take 50 mg by mouth 2 (two) times daily.   mycophenolate 500 MG tablet Commonly known as:  CELLCEPT Take 250 mg by mouth 2 (two) times daily.  pantoprazole 40 MG tablet Commonly known as:  PROTONIX Take 40 mg by mouth daily.   sodium bicarbonate 650 MG tablet Take 1 tablet (650 mg total) by mouth 2 (two) times daily. What changed:  how much to take  when to take this   tacrolimus 0.5 MG capsule Commonly known as:  PROGRAF Take 1 mg by mouth daily.      Follow-up Information    [name], DO. Schedule an appointment as soon as  possible for a visit in 2 week(s).   Contact information: [address] [place] [address] [phone]        Windy Kalata, MD .   Specialty:  Nephrology Why:  call short stay to schedule procrit and iron infusions. follow up with Dr. Mercy Moore as scheduled Contact information: [address] [place] [address] [phone]          Allergies  Allergen Reactions  . Codeine Nausea And Vomiting  . Elavil [Amitriptyline] Other (See Comments)    unknown  . Iodinated Diagnostic Agents      Renal transplant pt  . Lipitor [Atorvastatin]   . Magnesium Hives    Pt tolerated oral and IV during 06/2014 admission   . Metoclopramide     Other reaction(s): Other (See Comments) "doesn't work"  . Morphine     Other reaction(s): Confusion (intolerance)  . Morphine And Related   . Neurontin [Gabapentin]   . Niacin And Related   . Norvasc [Amlodipine Besylate]   . Talwin [Pentazocine]   . Ultram [Tramadol]     Consultations:     Procedures/Studies: Ct Abdomen Pelvis Wo Contrast  Result Date: 12/15/2015 CLINICAL DATA:  Right upper quadrant abdominal pain for 1 year. Nausea and early satiety. EXAM: CT ABDOMEN AND PELVIS WITHOUT CONTRAST TECHNIQUE: Multidetector CT imaging of the abdomen and pelvis was performed following the standard protocol without IV contrast. COMPARISON:  CT abdomen pelvis - 07/20/2010 FINDINGS: The lack of intravenous contrast limits the ability to evaluate solid abdominal organs. Lower chest: Limited visualization of the lower thorax is negative for focal airspace opacity or pleural effusion. Normal heart size. Coronary artery calcifications. No pericardial effusion. Hepatobiliary: Normal hepatic contour. Post cholecystectomy. No ascites. Pancreas: Normal noncontrast appearance of the pancreas Spleen: Normal noncontrast appearance of the spleen Adrenals/Urinary Tract: Re- demonstrated marked atrophy of the bilateral native kidneys with right lower  quadrant renal transplant. Grossly unchanged appearance of an approximately 3.6 cm exophytic hypo attenuating (16 Hounsfield unit) lesion arising from the inferior pole the right kidney (coronal image 57, series 4) which is incompletely characterized on this noncontrast examination though morphologically similar to the 2012 examination and favored to represent a renal cyst. Embolization coils are again seen within the inferior pole the right lower quadrant transplant kidney. No evidence of urinary obstruction. Normal noncontrast appearance of the bilateral adrenal glands. Normal noncontrast appearance of the urinary bladder. Stomach/Bowel: Normal noncontrast appearance of the terminal ileum and appendix. No pneumoperitoneum, pneumatosis or portal venous gas. Vascular/Lymphatic: Minimal amount of atherosclerotic plaque with a normal caliber abdominal aorta. No bulky retroperitoneal, mesenteric, pelvic or inguinal lymphadenopathy. Reproductive: Post hysterectomy.  No discrete adnexal lesions. Other: Regional soft tissues appear normal. Musculoskeletal: No acute or aggressive osseous abnormalities. Mild-to-moderate multilevel lumbar spine DDD, worse at L5-S1 with disc space height loss, endplate irregularity and sclerosis. Mild presumably degenerative scoliotic curvature of the thoracolumbar spine with dominant caudal component convex to the right. IMPRESSION: 1. No explanation for patient's chronic right upper quadrant abdominal pain.  Specifically, no evidence of enteric obstruction. Normal noncontrast appearance of the appendix. 2. Stable sequela of right lower quadrant renal transplant with embolization coils within the inferior pole of the transplanted kidney. No evidence of urinary obstruction. 3. Post cholecystectomy. 4. Coronary artery calcifications. Aortic Atherosclerosis (ICD10-170.0) Electronically Signed   By: Sandi Mariscal M.D.   On: 12/15/2015 20:04   Dg Chest 2 View  Result Date: 01/05/2016 CLINICAL  DATA:  Fever and fatigue. EXAM: CHEST  2 VIEW COMPARISON:  12/12/2015 FINDINGS: The heart size and mediastinal contours are within normal limits. 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 There is a stable 10 mm nodular density overlying the superior left hemithorax. Both lungs are otherwise clear. The visualized skeletal structures are unremarkable. Cholecystectomy clips are noted in the upper abdomen. IMPRESSION: No active cardiopulmonary disease. Stable 10 mm nodular density in the left upper lung field. 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 Attention on follow-up is recommended. Electronically Signed   By: Fidela Salisbury M.D.   On: 01/05/2016 14:07   US Renal Transplant W/doppler  Result Date: 01/06/2016 CLINICAL DATA:  Recurrent urinary tract infections and history of prior renal transplantation. EXAM: ULTRASOUND OF RENAL TRANSPLANT WITH RENAL DOPPLER ULTRASOUND TECHNIQUE: Ultrasound examination of the renal transplant was performed with gray-scale, color and duplex doppler evaluation. COMPARISON:  None. FINDINGS: Transplant kidney location: Right iliac fossa Transplant Kidney: Length: 11.8 cm. Normal in size and parenchymal echogenicity. No evidence of mass or hydronephrosis. No peri-transplant fluid collection seen. Color flow in the main renal artery:  Yes Color flow in the main renal vein:  Yes Duplex Doppler Evaluation: Main Renal Artery Resistive Index: 0.84 Venous waveform in main renal vein:  Present. Intrarenal resistive index in upper pole:  0.58 (normal 0.6-0.8; equivocal 0.8-0.9; abnormal >= 0.9) Intrarenal resistive index in lower pole: 0.79 (normal 0.6-0.8; equivocal 0.8-0.9; abnormal >= 0.9) Bladder: Normal for degree of bladder distention. Other findings: No abnormal fluid collections surrounding the transplanted kidney. IMPRESSION: Unremarkable transplant kidney ultrasound demonstrating no evidence of a transplant hydronephrosis, abnormal  resistance indices or surrounding fluid collections. Electronically Signed   By: Aletta Edouard M.D.   On: 01/06/2016 15:14   Portable Chest 1 View  Result Date: 12/12/2015 CLINICAL DATA:  Generalized weakness. EXAM: PORTABLE CHEST 1 VIEW COMPARISON:  Acute abdominal series 10/18/2014 FINDINGS: The heart is mildly enlarged. Atherosclerotic calcifications are present at the aortic arch. Left upper lobe nodule is stable. Scarring at the right apex is stable. No focal airspace disease present. There is no edema or effusion. The visualized soft tissues and bony thorax are unremarkable. IMPRESSION: 1. Borderline cardiomegaly without failure. 2. Several left upper lobe nodule and right upper lobe scarring likely reflecting granulomatous disease. Electronically Signed   By: San Morelle M.D.   On: 12/12/2015 19:51      Subjective:  no new complaints  Discharge Exam: Vitals:   01/08/16 2040 01/09/16 0550  BP: 134/67 (!) 128/91  Pulse: 70 67  Resp: 20 20  Temp: 98.4 F (36.9 C) 98.3 F (36.8 C)   Vitals:   01/08/16 0628 01/08/16 1403 01/08/16 2040 01/09/16 0550  BP: (!) 135/49 134/68 134/67 (!) 128/91  Pulse: 80 78 70 67  Resp:   20 20  Temp: 98.2 F (36.8 C) 98.6 F (37 C) 98.4 F (36.9 C) 98.3 F (36.8 C)  TempSrc: Oral Oral Oral Oral  SpO2: 100% 100% 99% 100%  Weight:      Height:  General: Pt is alert, awake, not in acute distress Cardiovascular: RRR, S1/S2 +, no rubs, no gallops Respiratory: CTA bilaterally, no wheezing, no rhonchi Abdominal: Soft, NT, ND, bowel sounds + Extremities: no edema, no cyanosis    The results of significant diagnostics from this hospitalization (including imaging, microbiology, ancillary and laboratory) are listed below for reference.     Microbiology: Recent Results (from the past 240 hour(s))  Culture, blood (routine x 2)     Status: Abnormal   Collection Time: 01/05/16 11:46 AM  Result Value Ref Range Status   Specimen  Description BLOOD LEFT HAND DRAWN BY RN  Final   Special Requests BOTTLES DRAWN AEROBIC ONLY 6CC  Final   Culture  Setup Time   Final    GRAM NEGATIVE RODS AEB BOTTLE Gram Stain Report Called to,Read Back By and Verified With: HANDY T AT 0518 ON [id number] BY FORSYTH K    Culture (A)  Final    ESCHERICHIA COLI SUSCEPTIBILITIES PERFORMED ON PREVIOUS CULTURE WITHIN THE LAST 5 DAYS. Performed at [facility]    Report Status 01/08/2016 FINAL  Final  Culture, blood (routine x 2)     Status: Abnormal   Collection Time: 01/05/16 11:58 AM  Result Value Ref Range Status   Specimen Description BLOOD RIGHT ANTECUBITAL  Final   Special Requests   Final    BOTTLES DRAWN AEROBIC AND ANAEROBIC AEB=14CC ANA=6CC   Culture  Setup Time   Final    GRAM NEGATIVE RODS IN BOTH BOTTLES Gram Stain Report Called to,Read Back By and Verified With: HANDY T AT 0518 ON [id number] BY FORSYTH K Organism ID to follow CRITICAL RESULT CALLED TO, READ BACK BY AND VERIFIED WITH: L SEAY 01/06/16 @ 1500 M VESTAL Performed at [place] (A)  Final   Report Status 01/08/2016 FINAL  Final   Organism ID, Bacteria ESCHERICHIA COLI  Final      Susceptibility   Escherichia coli - MIC*    AMPICILLIN >=32 RESISTANT Resistant     CEFAZOLIN <=4 SENSITIVE Sensitive     CEFEPIME <=1 SENSITIVE Sensitive     CEFTAZIDIME <=1 SENSITIVE Sensitive     CEFTRIAXONE <=1 SENSITIVE Sensitive     CIPROFLOXACIN <=0.25 SENSITIVE Sensitive     GENTAMICIN <=1 SENSITIVE Sensitive     IMIPENEM <=0.25 SENSITIVE Sensitive     TRIMETH/SULFA >=320 RESISTANT Resistant     AMPICILLIN/SULBACTAM 16 INTERMEDIATE Intermediate     PIP/TAZO <=4 SENSITIVE Sensitive     Extended ESBL NEGATIVE Sensitive     * ESCHERICHIA COLI  Blood Culture ID Panel (Reflexed)     Status: Abnormal   Collection Time: 01/05/16 11:58 AM  Result Value Ref Range Status   Enterococcus species NOT DETECTED NOT DETECTED Corrected   Listeria  monocytogenes NOT DETECTED NOT DETECTED Corrected   Staphylococcus species NOT DETECTED NOT DETECTED Corrected   Staphylococcus aureus NOT DETECTED NOT DETECTED Corrected   Streptococcus species NOT DETECTED NOT DETECTED Corrected   Streptococcus agalactiae NOT DETECTED NOT DETECTED Corrected   Streptococcus pneumoniae NOT DETECTED NOT DETECTED Corrected   Streptococcus pyogenes NOT DETECTED NOT DETECTED Corrected   Acinetobacter baumannii NOT DETECTED NOT DETECTED Corrected   Enterobacteriaceae species DETECTED (A) NOT DETECTED Corrected    Comment: CRITICAL RESULT CALLED TO, READ BACK BY AND VERIFIED WITH: L SEAY 01/06/16 @ 1500 M VESTAL    Enterobacter cloacae complex NOT DETECTED NOT DETECTED Corrected   Escherichia coli DETECTED (A) NOT  DETECTED Corrected    Comment: CRITICAL RESULT CALLED TO, READ BACK BY AND VERIFIED WITH: L SEAY 01/06/16 @ 1500 M VESTAL    Klebsiella oxytoca NOT DETECTED NOT DETECTED Corrected   Klebsiella pneumoniae NOT DETECTED NOT DETECTED Corrected   Proteus species NOT DETECTED NOT DETECTED Corrected   Serratia marcescens NOT DETECTED NOT DETECTED Corrected   Carbapenem resistance NOT DETECTED NOT DETECTED Corrected   Haemophilus influenzae NOT DETECTED NOT DETECTED Corrected   Neisseria meningitidis NOT DETECTED NOT DETECTED Corrected   Pseudomonas aeruginosa NOT DETECTED NOT DETECTED Corrected   Candida albicans NOT DETECTED NOT DETECTED Corrected   Candida glabrata NOT DETECTED NOT DETECTED Corrected   Candida krusei NOT DETECTED NOT DETECTED Corrected   Candida parapsilosis NOT DETECTED NOT DETECTED Corrected   Candida tropicalis NOT DETECTED NOT DETECTED Corrected    Comment: Performed at [facility]  Urine culture     Status: Abnormal   Collection Time: 01/05/16 12:00 PM  Result Value Ref Range Status   Specimen Description URINE, CLEAN CATCH  Final   Special Requests NONE  Final   Culture >=100,000 COLONIES/mL ESCHERICHIA COLI (A)  Final    Report Status 01/07/2016 FINAL  Final   Organism ID, Bacteria ESCHERICHIA COLI (A)  Final      Susceptibility   Escherichia coli - MIC*    AMPICILLIN >=32 RESISTANT Resistant     CEFAZOLIN <=4 SENSITIVE Sensitive     CEFTRIAXONE <=1 SENSITIVE Sensitive     CIPROFLOXACIN <=0.25 SENSITIVE Sensitive     GENTAMICIN <=1 SENSITIVE Sensitive     IMIPENEM <=0.25 SENSITIVE Sensitive     NITROFURANTOIN <=16 SENSITIVE Sensitive     TRIMETH/SULFA >=320 RESISTANT Resistant     AMPICILLIN/SULBACTAM 16 INTERMEDIATE Intermediate     PIP/TAZO <=4 SENSITIVE Sensitive     Extended ESBL NEGATIVE Sensitive     * >=100,000 COLONIES/mL ESCHERICHIA COLI     Labs: BNP (last 3 results) No results for input(s): BNP in the last 8760 hours. Basic Metabolic Panel:  Recent Labs Lab 01/05/16 1156 01/06/16 0529 01/07/16 0443  NA 135 137 140  K 4.0 4.1 4.1  CL 104 110 112*  CO2 20* 21* 22  GLUCOSE 83 80 70  BUN 47* 41* 34*  CREATININE 2.93* 2.57* 2.11*  CALCIUM 8.5* 7.9* 8.5*   Liver Function Tests:  Recent Labs Lab 01/05/16 1156 01/06/16 0529  AST 17 11*  ALT 11* 8*  ALKPHOS 99 72  BILITOT 1.4* 0.5  PROT 6.7 5.1*  ALBUMIN 3.8 2.8*    Recent Labs Lab 01/05/16 1156  LIPASE 11   No results for input(s): AMMONIA in the last 168 hours. CBC:  Recent Labs Lab 01/05/16 1040 01/05/16 1146 01/06/16 0529 01/07/16 0443  WBC  --  5.9 2.7* 2.4*  NEUTROABS  --  4.4  --   --   HGB 11.5* 11.1* 8.8* 9.8*  HCT 34.9* 33.8* 27.2* 29.5*  MCV  --  90.9 93.2 90.5  PLT  --  93* 73* 96*   Cardiac Enzymes: No results for input(s): CKTOTAL, CKMB, CKMBINDEX, TROPONINI in the last 168 hours. BNP: Invalid input(s): POCBNP CBG:  Recent Labs Lab 01/08/16 0907 01/08/16 1117 01/08/16 1642 01/08/16 2039 01/09/16 0738  GLUCAP 138* 105* 109* 139* 100*   D-Dimer No results for input(s): DDIMER in the last 72 hours. Hgb A1c No results for input(s): HGBA1C in the last 72 hours. Lipid Profile No  results for input(s): CHOL, HDL, LDLCALC, TRIG, CHOLHDL, LDLDIRECT in  the last 72 hours. Thyroid function studies No results for input(s): TSH, T4TOTAL, T3FREE, THYROIDAB in the last 72 hours.  Invalid input(s): FREET3 Anemia work up No results for input(s): VITAMINB12, FOLATE, FERRITIN, TIBC, IRON, RETICCTPCT in the last 72 hours. Urinalysis    Component Value Date/Time   COLORURINE YELLOW 01/05/2016 1200   APPEARANCEUR TURBID (A) 01/05/2016 1200   LABSPEC 1.010 01/05/2016 1200   PHURINE 6.0 01/05/2016 1200   GLUCOSEU NEGATIVE 01/05/2016 1200   HGBUR MODERATE (A) 01/05/2016 1200   BILIRUBINUR NEGATIVE 01/05/2016 1200   KETONESUR TRACE (A) 01/05/2016 1200   PROTEINUR 100 (A) 01/05/2016 1200   UROBILINOGEN 0.2 10/16/2014 2059   NITRITE NEGATIVE 01/05/2016 1200   LEUKOCYTESUR LARGE (A) 01/05/2016 1200   Sepsis Labs Invalid input(s): PROCALCITONIN,  WBC,  LACTICIDVEN Microbiology Recent Results (from the past 240 hour(s))  Culture, blood (routine x 2)     Status: Abnormal   Collection Time: 01/05/16 11:46 AM  Result Value Ref Range Status   Specimen Description BLOOD LEFT HAND DRAWN BY RN  Final   Special Requests BOTTLES DRAWN AEROBIC ONLY 6CC  Final   Culture  Setup Time   Final    GRAM NEGATIVE RODS AEB BOTTLE Gram Stain Report Called to,Read Back By and Verified With: HANDY T AT 0518 ON [id number] BY FORSYTH K    Culture (A)  Final    ESCHERICHIA COLI SUSCEPTIBILITIES PERFORMED ON PREVIOUS CULTURE WITHIN THE LAST 5 DAYS. Performed at [facility]    Report Status 01/08/2016 FINAL  Final  Culture, blood (routine x 2)     Status: Abnormal   Collection Time: 01/05/16 11:58 AM  Result Value Ref Range Status   Specimen Description BLOOD RIGHT ANTECUBITAL  Final   Special Requests   Final    BOTTLES DRAWN AEROBIC AND ANAEROBIC AEB=14CC ANA=6CC   Culture  Setup Time   Final    GRAM NEGATIVE RODS IN BOTH BOTTLES Gram Stain Report Called to,Read Back By and Verified With:  HANDY T AT 0518 ON [id number] BY FORSYTH K Organism ID to follow CRITICAL RESULT CALLED TO, READ BACK BY AND VERIFIED WITH: L SEAY 01/06/16 @ 1500 M VESTAL Performed at [place] (A)  Final   Report Status 01/08/2016 FINAL  Final   Organism ID, Bacteria ESCHERICHIA COLI  Final      Susceptibility   Escherichia coli - MIC*    AMPICILLIN >=32 RESISTANT Resistant     CEFAZOLIN <=4 SENSITIVE Sensitive     CEFEPIME <=1 SENSITIVE Sensitive     CEFTAZIDIME <=1 SENSITIVE Sensitive     CEFTRIAXONE <=1 SENSITIVE Sensitive     CIPROFLOXACIN <=0.25 SENSITIVE Sensitive     GENTAMICIN <=1 SENSITIVE Sensitive     IMIPENEM <=0.25 SENSITIVE Sensitive     TRIMETH/SULFA >=320 RESISTANT Resistant     AMPICILLIN/SULBACTAM 16 INTERMEDIATE Intermediate     PIP/TAZO <=4 SENSITIVE Sensitive     Extended ESBL NEGATIVE Sensitive     * ESCHERICHIA COLI  Blood Culture ID Panel (Reflexed)     Status: Abnormal   Collection Time: 01/05/16 11:58 AM  Result Value Ref Range Status   Enterococcus species NOT DETECTED NOT DETECTED Corrected   Listeria monocytogenes NOT DETECTED NOT DETECTED Corrected   Staphylococcus species NOT DETECTED NOT DETECTED Corrected   Staphylococcus aureus NOT DETECTED NOT DETECTED Corrected   Streptococcus species NOT DETECTED NOT DETECTED Corrected   Streptococcus agalactiae NOT DETECTED NOT DETECTED Corrected  Streptococcus pneumoniae NOT DETECTED NOT DETECTED Corrected   Streptococcus pyogenes NOT DETECTED NOT DETECTED Corrected   Acinetobacter baumannii NOT DETECTED NOT DETECTED Corrected   Enterobacteriaceae species DETECTED (A) NOT DETECTED Corrected    Comment: CRITICAL RESULT CALLED TO, READ BACK BY AND VERIFIED WITH: L SEAY 01/06/16 @ 1500 M VESTAL    Enterobacter cloacae complex NOT DETECTED NOT DETECTED Corrected   Escherichia coli DETECTED (A) NOT DETECTED Corrected    Comment: CRITICAL RESULT CALLED TO, READ BACK BY AND VERIFIED WITH: L  SEAY 01/06/16 @ 1500 M VESTAL    Klebsiella oxytoca NOT DETECTED NOT DETECTED Corrected   Klebsiella pneumoniae NOT DETECTED NOT DETECTED Corrected   Proteus species NOT DETECTED NOT DETECTED Corrected   Serratia marcescens NOT DETECTED NOT DETECTED Corrected   Carbapenem resistance NOT DETECTED NOT DETECTED Corrected   Haemophilus influenzae NOT DETECTED NOT DETECTED Corrected   Neisseria meningitidis NOT DETECTED NOT DETECTED Corrected   Pseudomonas aeruginosa NOT DETECTED NOT DETECTED Corrected   Candida albicans NOT DETECTED NOT DETECTED Corrected   Candida glabrata NOT DETECTED NOT DETECTED Corrected   Candida krusei NOT DETECTED NOT DETECTED Corrected   Candida parapsilosis NOT DETECTED NOT DETECTED Corrected   Candida tropicalis NOT DETECTED NOT DETECTED Corrected    Comment: Performed at [facility]  Urine culture     Status: Abnormal   Collection Time: 01/05/16 12:00 PM  Result Value Ref Range Status   Specimen Description URINE, CLEAN CATCH  Final   Special Requests NONE  Final   Culture >=100,000 COLONIES/mL ESCHERICHIA COLI (A)  Final   Report Status 01/07/2016 FINAL  Final   Organism ID, Bacteria ESCHERICHIA COLI (A)  Final      Susceptibility   Escherichia coli - MIC*    AMPICILLIN >=32 RESISTANT Resistant     CEFAZOLIN <=4 SENSITIVE Sensitive     CEFTRIAXONE <=1 SENSITIVE Sensitive     CIPROFLOXACIN <=0.25 SENSITIVE Sensitive     GENTAMICIN <=1 SENSITIVE Sensitive     IMIPENEM <=0.25 SENSITIVE Sensitive     NITROFURANTOIN <=16 SENSITIVE Sensitive     TRIMETH/SULFA >=320 RESISTANT Resistant     AMPICILLIN/SULBACTAM 16 INTERMEDIATE Intermediate     PIP/TAZO <=4 SENSITIVE Sensitive     Extended ESBL NEGATIVE Sensitive     * >=100,000 COLONIES/mL ESCHERICHIA COLI     Time coordinating discharge: Over 30 minutes  SIGNED:   Kathie Dike, MD  [facility] 01/09/2016, 11:38 AM Pager [phone]  If 7PM-7AM, please contact  night-coverage [email or web address] Password [email or web address]

## 2016-01-11 ENCOUNTER — Other Ambulatory Visit: Payer: Self-pay | Admitting: *Deleted

## 2016-01-11 DIAGNOSIS — I6523 Occlusion and stenosis of bilateral carotid arteries: Secondary | ICD-10-CM

## 2016-01-13 ENCOUNTER — Encounter: Payer: Self-pay | Admitting: Family

## 2016-01-13 ENCOUNTER — Ambulatory Visit (HOSPITAL_COMMUNITY)
Admission: RE | Admit: 2016-01-13 | Discharge: 2016-01-13 | Disposition: A | Discharge status: Home / Self Care | Payer: Medicare Other | Source: Ambulatory Visit | Attending: Family | Admitting: Family

## 2016-01-13 ENCOUNTER — Ambulatory Visit (INDEPENDENT_AMBULATORY_CARE_PROVIDER_SITE_OTHER): Payer: Medicare Other | Admitting: Family

## 2016-01-13 VITALS — BP 119/62 | HR 58 | Temp 97.2°F | Resp 16 | Ht 63.0 in | Wt 92.0 lb

## 2016-01-13 DIAGNOSIS — I6521 Occlusion and stenosis of right carotid artery: Secondary | ICD-10-CM | POA: Diagnosis not present

## 2016-01-13 DIAGNOSIS — Z940 Kidney transplant status: Secondary | ICD-10-CM

## 2016-01-13 DIAGNOSIS — I6523 Occlusion and stenosis of bilateral carotid arteries: Secondary | ICD-10-CM | POA: Diagnosis present

## 2016-01-13 LAB — VAS US CAROTID
LCCADDIAS: 13 cm/s
LCCADSYS: 62 cm/s
LCCAPDIAS: 15 cm/s
LCCAPSYS: 70 cm/s
LICAPDIAS: -23 cm/s
LICAPSYS: -90 cm/s
Left ICA dist dias: -23 cm/s
Left ICA dist sys: -91 cm/s
RCCAPSYS: -75 cm/s
RIGHT CCA MID DIAS: 14 cm/s
Right CCA prox dias: -9 cm/s
Right cca dist sys: -95 cm/s

## 2016-01-13 NOTE — Progress Notes (Signed)
Chief Complaint: Follow up Extracranial Carotid Artery Stenosis   History of Present Illness  [name] is a 75 y.o. female who returns today for follow-up. Dr. Trula Slade initially met her in February 2016 for evaluation of carotid artery stenosis. She had a carotid ultrasound performed at another facility that revealed greater than 70% right carotid artery stenosis and less than 50% left carotid stenosis. Ultrasound in our office identified 60-79 percent right carotid artery stenosis. Medical management was recommended given that she was asymptomatic.   She was hospitalized for a kidney infection in 2016. She also suffered aspiration pneumonia which led to intubation. She has recovered from this. She also lost a significant amount of weight and is trying to gain that back. She denies post prandial abdominal pian, denies food fear. She is losing weight but was again recently hospitalized for kidney infection from E.coli. She was discharge from the hospital a few days ago and is taking oral antibx.    The patient has a history of a kidney transplant. 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 She had a right-sided catheter for 2 months prior to her living related transplant. The patient is intolerant of statins as these cause leg pain. She is medically managed for hypertension. She is a nonsmoker.  Patient has not had previous carotid artery intervention.  The patient denies any history of TIA or stroke symptoms, specifically the patient denies a history of amaurosis fugax or monocular blindness, denies a history unilateral  of facial drooping, denies a history of hemiplegia, and denies a history of receptive or expressive aphasia.    Pt denies any claudication sx's with walking.   She had iron deficiency anemia, received iron infusions on 3 separate occasions, the last was January 2017, is also receiving Aranesp.   Pt Diabetic: August 2016 A1C was 5.2 (review of  records); she states she did not have DM until after the transplant.  Pt smoker: non-smoker  Pt meds include: Statin : no, causes leg pain ASA: yes Other anticoagulants/antiplatelets: no   Past Medical History:  Diagnosis Date  . Anemia   . Arthritis    "knees" (10/16/2014)  . Carotid artery stenosis   . Chronic kidney disease    h/o transplant  . Family history of adverse reaction to anesthesia    "my daughter; they thought she was asleep and she wasn't"  . Frequent UTI   . GERD (gastroesophageal reflux disease)   . History of blood transfusion "several"   "all related to anemia"  . Hyperlipemia   . Hypertension   . Hypothyroidism   . Pneumonia "1-2 times"  . Type II diabetes mellitus ([place])     Social History Social History  Substance Use Topics  . Smoking status: Never Smoker  . Smokeless tobacco: Never Used  . Alcohol use No    Family History Family History  Problem Relation Age of Onset  . Diabetes Mother   . Hypertension Mother   . Melanoma Mother   . Hyperlipidemia Mother   . Heart disease Father   . Varicose Veins Father   . Hypertension Sister   . Cancer Daughter   . Hypertension Son   . Hyperlipidemia Sister   . Hyperlipidemia Daughter   . Hyperlipidemia Son   . Hypertension Daughter     Surgical History Past Surgical History:  Procedure Laterality Date  . ABDOMINAL HYSTERECTOMY    . APPENDECTOMY    . BACK SURGERY    . CHOLECYSTECTOMY OPEN    .  KIDNEY SURGERY     multiple  . KIDNEY TRANSPLANT Right 2004   at [facility]  . LUMBAR DISC SURGERY     "L5"  . THYROIDECTOMY     1/2 removed  . TONSILLECTOMY    . TUBAL LIGATION      Allergies  Allergen Reactions  . Codeine Nausea And Vomiting  . Elavil [Amitriptyline] Other (See Comments)    unknown  . Iodinated Diagnostic Agents      Renal transplant pt  . Lipitor [Atorvastatin]   . Magnesium Hives    Pt tolerated oral and IV during 06/2014 admission   . Metoclopramide     Other  reaction(s): Other (See Comments) "doesn't work"  . Morphine     Other reaction(s): Confusion (intolerance)  . Morphine And Related   . Neurontin [Gabapentin]   . Niacin And Related   . Norvasc [Amlodipine Besylate]   . Talwin [Pentazocine]   . Ultram [Tramadol]     Current Outpatient Prescriptions  Medication Sig Dispense Refill  . aspirin 81 MG tablet Take 81 mg by mouth at bedtime.     . Blood Glucose Monitoring Suppl (ACCU-CHEK AVIVA PLUS) w/Device KIT Use as directed 2 x daily 1 kit 0  . ciprofloxacin (CIPRO) 500 MG tablet Take 1 tablet (500 mg total) by mouth daily with breakfast. 10 tablet 0  . clonazePAM (KLONOPIN) 1 MG tablet Take 1 mg by mouth at bedtime.    . dicyclomine (BENTYL) 10 MG capsule Take 10 mg by mouth 3 (three) times daily before meals.     . fluconazole (DIFLUCAN) 100 MG tablet Take 100 mg by mouth daily.    Marland Kitchen glucose blood (ACCU-CHEK AVIVA) test strip Use as instructed bid 100 each 5  . hyoscyamine (LEVSIN, ANASPAZ) 0.125 MG tablet Take 0.125 mg by mouth every 4 (four) hours as needed for cramping.     Marland Kitchen levothyroxine (SYNTHROID, LEVOTHROID) 75 MCG tablet Take 75 mcg by mouth daily before breakfast.    . magnesium oxide (MAG-OX) 400 MG tablet Take 400 mg by mouth daily.    . metoprolol (LOPRESSOR) 50 MG tablet Take 50 mg by mouth 2 (two) times daily.    . mycophenolate (CELLCEPT) 500 MG tablet Take 250 mg by mouth 2 (two) times daily.     . pantoprazole (PROTONIX) 40 MG tablet Take 40 mg by mouth daily.  5  . sodium bicarbonate 650 MG tablet Take 1 tablet (650 mg total) by mouth 2 (two) times daily. (Patient taking differently: Take 1,300 mg by mouth daily. ) 60 tablet 0  . tacrolimus (PROGRAF) 0.5 MG capsule Take 1 mg by mouth daily.      No current facility-administered medications for this visit.     Review of Systems : See HPI for pertinent positives and negatives.  Physical Examination  Vitals:   01/13/16 1334  BP: 110/71  Pulse: (!) 58  Resp: 16   Temp: 97.2 F (36.2 C)  TempSrc: Oral  SpO2: 97%  Weight: 92 lb (41.7 kg)  Height: 5' 3"  (1.6 m)   Body mass index is 16.3 kg/m.  General: WDWN thin female in NAD GAIT: normal Eyes: PERRLA Pulmonary: Respirations are non-labored, CTAB, good air movement   Cardiac: regular rhythm, no detected murmur.  VASCULAR EXAM Carotid Bruits Right Left   negative positive    Aorta is not palpable. Radial pulses are 2+ palpable and equal.  LE Pulses Right Left       POPLITEAL  not palpable  not palpable       POSTERIOR TIBIAL   palpable  not palpable       DORSALIS PEDIS      ANTERIOR TIBIAL  palpable  palpable    Gastrointestinal: soft, nontender, BS WNL, no r/g,  no palpable masses.  Musculoskeletal: no muscle atrophy/wasting. M/S 5/5 throughout, extremities without ischemic changes.  Neurologic: A&O X 3; Appropriate Affect, Speech is normal CN 2-12 intact, pain and light touch intact in extremities, Motor exam as listed above.    Assessment: [name] is a 74 y.o. female who has no history of stroke or TIA.  DATA Today's carotid duplex suggests 60-79% right ICA stenosis and no stenosis of the left ICA.  No significant change compared to 01/05/15 and 2/27/17exams.    Plan: Follow-up in 6 months with Carotid Duplex scan.   I discussed in depth with the patient the nature of atherosclerosis, and emphasized the importance of maximal medical management including strict control of blood pressure, blood glucose, and lipid levels, obtaining regular exercise, and continued cessation of smoking.  The patient is aware that without maximal medical management the underlying atherosclerotic disease process will progress, limiting the benefit of any interventions. The patient was given information about stroke prevention  and what symptoms should prompt the patient to seek immediate medical care. Thank you for allowing [place] to participate in this patient's care.  Clemon Chambers, RN, MSN, FNP-C [facility] Office: [phone]  Clinic Physician: Scot Dock  01/13/16 1:40 PM

## 2016-01-13 NOTE — Patient Instructions (Signed)
Stroke Prevention Some medical conditions and behaviors are associated with an increased chance of having a stroke. You may prevent a stroke by making healthy choices and managing medical conditions. HOW CAN I REDUCE MY RISK OF HAVING A STROKE?   Stay physically active. Get at least 30 minutes of activity on most or all days.  Do not smoke. It may also be helpful to avoid exposure to secondhand smoke.  Limit alcohol use. Moderate alcohol use is considered to be:  No more than 2 drinks per day for men.  No more than 1 drink per day for nonpregnant women.  Eat healthy foods. This involves:  Eating 5 or more servings of fruits and vegetables a day.  Making dietary changes that address high blood pressure (hypertension), high cholesterol, diabetes, or obesity.  Manage your cholesterol levels.  Making food choices that are high in fiber and low in saturated fat, trans fat, and cholesterol may control cholesterol levels.  Take any prescribed medicines to control cholesterol as directed by your health care provider.  Manage your diabetes.  Controlling your carbohydrate and sugar intake is recommended to manage diabetes.  Take any prescribed medicines to control diabetes as directed by your health care provider.  Control your hypertension.  Making food choices that are low in salt (sodium), saturated fat, trans fat, and cholesterol is recommended to manage hypertension.  Ask your health care provider if you need treatment to lower your blood pressure. Take any prescribed medicines to control hypertension as directed by your health care provider.  If you are 18-39 years of age, have your blood pressure checked every 3-5 years. If you are 40 years of age or older, have your blood pressure checked every year.  Maintain a healthy weight.  Reducing calorie intake and making food choices that are low in sodium, saturated fat, trans fat, and cholesterol are recommended to manage  weight.  Stop drug abuse.  Avoid taking birth control pills.  Talk to your health care provider about the risks of taking birth control pills if you are over 35 years old, smoke, get migraines, or have ever had a blood clot.  Get evaluated for sleep disorders (sleep apnea).  Talk to your health care provider about getting a sleep evaluation if you snore a lot or have excessive sleepiness.  Take medicines only as directed by your health care provider.  For some people, aspirin or blood thinners (anticoagulants) are helpful in reducing the risk of forming abnormal blood clots that can lead to stroke. If you have the irregular heart rhythm of atrial fibrillation, you should be on a blood thinner unless there is a good reason you cannot take them.  Understand all your medicine instructions.  Make sure that other conditions (such as anemia or atherosclerosis) are addressed. SEEK IMMEDIATE MEDICAL CARE IF:   You have sudden weakness or numbness of the face, arm, or leg, especially on one side of the body.  Your face or eyelid droops to one side.  You have sudden confusion.  You have trouble speaking (aphasia) or understanding.  You have sudden trouble seeing in one or both eyes.  You have sudden trouble walking.  You have dizziness.  You have a loss of balance or coordination.  You have a sudden, severe headache with no known cause.  You have new chest pain or an irregular heartbeat. Any of these symptoms may represent a serious problem that is an emergency. Do not wait to see if the symptoms will   go away. Get medical help at once. Call your local emergency services (911 in [place] Do not drive yourself to the hospital.   This information is not intended to replace advice given to you by your health care provider. Make sure you discuss any questions you have with your health care provider.   Document Released: 06/09/2004 Document Revised: 05/23/2014 Document Reviewed:  11/02/2012 Elsevier Interactive Patient Education 2016 [employer]vier Inc.  

## 2016-01-14 ENCOUNTER — Ambulatory Visit: Payer: Medicare Other | Admitting: "Endocrinology

## 2016-01-14 ENCOUNTER — Other Ambulatory Visit: Payer: Self-pay | Admitting: Family

## 2016-01-14 DIAGNOSIS — I6523 Occlusion and stenosis of bilateral carotid arteries: Secondary | ICD-10-CM

## 2016-01-22 ENCOUNTER — Encounter (HOSPITAL_COMMUNITY)
Admission: RE | Admit: 2016-01-22 | Discharge: 2016-01-22 | Disposition: A | Discharge status: Home / Self Care | Payer: Medicare Other | Source: Ambulatory Visit | Attending: Nephrology | Admitting: Nephrology

## 2016-01-22 DIAGNOSIS — N184 Chronic kidney disease, stage 4 (severe): Secondary | ICD-10-CM | POA: Insufficient documentation

## 2016-01-22 DIAGNOSIS — D638 Anemia in other chronic diseases classified elsewhere: Secondary | ICD-10-CM | POA: Insufficient documentation

## 2016-01-22 LAB — HEMOGLOBIN AND HEMATOCRIT, BLOOD
HCT: 33.1 % — ABNORMAL LOW (ref 36.0–46.0)
HEMOGLOBIN: 10.9 g/dL — AB (ref 12.0–15.0)

## 2016-01-22 LAB — IRON AND TIBC
Iron: 105 ug/dL (ref 28–170)
SATURATION RATIOS: 49 % — AB (ref 10.4–31.8)
TIBC: 216 ug/dL — AB (ref 250–450)
UIBC: 111 ug/dL

## 2016-01-22 LAB — FERRITIN: FERRITIN: 1558 ng/mL — AB (ref 11–307)

## 2016-01-22 MED ORDER — DARBEPOETIN ALFA 100 MCG/0.5ML IJ SOSY
100.0000 ug | PREFILLED_SYRINGE | INTRAMUSCULAR | Status: DC
  Administered 2016-01-22: 100 ug via SUBCUTANEOUS

## 2016-01-22 MED ORDER — FERUMOXYTOL INJECTION 510 MG/17 ML
510.0000 mg | INTRAVENOUS | Status: DC
Start: 2016-01-22 — End: 2016-01-23
  Administered 2016-01-22: 510 mg via INTRAVENOUS
  Filled 2016-01-22: qty 17

## 2016-01-22 MED ORDER — DARBEPOETIN ALFA 100 MCG/0.5ML IJ SOSY
PREFILLED_SYRINGE | INTRAMUSCULAR | Status: AC
  Filled 2016-01-22: qty 0.5

## 2016-01-22 MED ORDER — SODIUM CHLORIDE 0.9 % IV SOLN
INTRAVENOUS | Status: DC
  Administered 2016-01-22: 250 mL via INTRAVENOUS

## 2016-01-22 NOTE — Progress Notes (Signed)
Results for [name], Sheketa T (MRN [id number]) as of 01/22/2016 14:44  Ref. Range 01/22/2016 13:38  Hemoglobin Latest Ref Range: 12.0 - 15.0 g/dL 78.210.9 (L)  HCT Latest Ref Range: 36.0 - 46.0 % 33.1 (L)

## 2016-01-25 NOTE — Progress Notes (Signed)
Results for [name], Saleah T (MRN [id number]) as of 01/25/2016 09:20  Ref. Range 01/22/2016 13:38  Iron Latest Ref Range: 28 - 170 ug/dL 409105  UIBC Latest Units: ug/dL 811111  TIBC Latest Ref Range: 250 - 450 ug/dL 914216 (L)  Saturation Ratios Latest Ref Range: 10.4 - 31.8 % 49 (H)  Ferritin Latest Ref Range: 11 - 307 ng/mL 1,558 (H)  Hemoglobin Latest Ref Range: 12.0 - 15.0 g/dL 78.210.9 (L)  HCT Latest Ref Range: 36.0 - 46.0 % 33.1 (L)

## 2016-01-27 ENCOUNTER — Ambulatory Visit: Payer: Medicare Other | Admitting: "Endocrinology

## 2016-02-03 ENCOUNTER — Ambulatory Visit (INDEPENDENT_AMBULATORY_CARE_PROVIDER_SITE_OTHER): Payer: Medicare Other | Admitting: "Endocrinology

## 2016-02-03 ENCOUNTER — Encounter: Payer: Self-pay | Admitting: "Endocrinology

## 2016-02-03 VITALS — BP 113/73 | HR 73 | Ht 63.0 in | Wt 92.0 lb

## 2016-02-03 DIAGNOSIS — I10 Essential (primary) hypertension: Secondary | ICD-10-CM | POA: Diagnosis not present

## 2016-02-03 DIAGNOSIS — Z794 Long term (current) use of insulin: Secondary | ICD-10-CM | POA: Diagnosis not present

## 2016-02-03 DIAGNOSIS — E039 Hypothyroidism, unspecified: Secondary | ICD-10-CM

## 2016-02-03 DIAGNOSIS — N183 Chronic kidney disease, stage 3 unspecified: Secondary | ICD-10-CM

## 2016-02-03 DIAGNOSIS — E1122 Type 2 diabetes mellitus with diabetic chronic kidney disease: Secondary | ICD-10-CM | POA: Diagnosis not present

## 2016-02-03 MED ORDER — PANCRELIPASE (LIP-PROT-AMYL) 24000-76000 UNITS PO CPEP: ORAL_CAPSULE | ORAL | 3 refills | Status: DC

## 2016-02-03 NOTE — Progress Notes (Signed)
Subjective:    Patient ID: [name], female    DOB: 04/21/1941,    Past Medical History:  Diagnosis Date  . Anemia   . Arthritis    "knees" (10/16/2014)  . Carotid artery stenosis   . Chronic kidney disease    h/o transplant  . Family history of adverse reaction to anesthesia    "my daughter; they thought she was asleep and she wasn't"  . Frequent UTI   . GERD (gastroesophageal reflux disease)   . History of blood transfusion "several"   "all related to anemia"  . Hyperlipemia   . Hypertension   . Hypothyroidism   . Pneumonia "1-2 times"  . Type II diabetes mellitus ([place])    Past Surgical History:  Procedure Laterality Date  . ABDOMINAL HYSTERECTOMY    . APPENDECTOMY    . BACK SURGERY    . CHOLECYSTECTOMY OPEN    . KIDNEY SURGERY     multiple  . KIDNEY TRANSPLANT Right 2004   at [facility]  . LUMBAR DISC SURGERY     "L5"  . THYROIDECTOMY     1/2 removed  . TONSILLECTOMY    . TUBAL LIGATION     Social History   Social History  . Marital status: Widowed    Spouse name: N/A  . Number of children: 5  . Years of education: N/A   Occupational History  . Retired    Social History Main Topics  . Smoking status: Never Smoker  . Smokeless tobacco: Never Used  . Alcohol use No  . Drug use: No  . Sexual activity: No   Other Topics Concern  . None   Social History Narrative  . None   Outpatient Encounter Prescriptions as of 02/03/2016  Medication Sig  . aspirin 81 MG tablet Take 81 mg by mouth at bedtime.   . Blood Glucose Monitoring Suppl (ACCU-CHEK AVIVA PLUS) w/Device KIT Use as directed 2 x daily  . clonazePAM (KLONOPIN) 1 MG tablet Take 1 mg by mouth at bedtime.  . dicyclomine (BENTYL) 10 MG capsule Take 10 mg by mouth 3 (three) times daily before meals.   Marland Kitchen glucose blood (ACCU-CHEK AVIVA) test strip Use as instructed bid  . hyoscyamine (LEVSIN, ANASPAZ) 0.125 MG tablet Take 0.125 mg by mouth every 4 (four) hours as needed for cramping.   Marland Kitchen  levothyroxine (SYNTHROID, LEVOTHROID) 75 MCG tablet Take 75 mcg by mouth daily before breakfast.  . magnesium oxide (MAG-OX) 400 MG tablet Take 400 mg by mouth daily.  . metoprolol (LOPRESSOR) 50 MG tablet Take 50 mg by mouth 2 (two) times daily.  . mycophenolate (CELLCEPT) 500 MG tablet Take 250 mg by mouth 2 (two) times daily.   . Pancrelipase, Lip-Prot-Amyl, (CREON) 24000-76000 units CPEP 1  Capsule With Meals and snacks up to 6 a Day.  . pantoprazole (PROTONIX) 40 MG tablet Take 40 mg by mouth daily.  . sodium bicarbonate 650 MG tablet Take 1 tablet (650 mg total) by mouth 2 (two) times daily. (Patient taking differently: Take 1,300 mg by mouth daily. )  . tacrolimus (PROGRAF) 0.5 MG capsule Take 1 mg by mouth daily.   . [DISCONTINUED] ciprofloxacin (CIPRO) 500 MG tablet Take 1 tablet (500 mg total) by mouth daily with breakfast.  . [DISCONTINUED] fluconazole (DIFLUCAN) 100 MG tablet Take 100 mg by mouth daily.   No facility-administered encounter medications on file as of 02/03/2016.    ALLERGIES: Allergies  Allergen Reactions  . Codeine Nausea  And Vomiting  . Elavil [Amitriptyline] Other (See Comments)    unknown  . Iodinated Diagnostic Agents      Renal transplant pt  . Lipitor [Atorvastatin]   . Magnesium Hives    Pt tolerated oral and IV during 06/2014 admission   . Metoclopramide     Other reaction(s): Other (See Comments) "doesn't work"  . Morphine     Other reaction(s): Confusion (intolerance)  . Morphine And Related   . Neurontin [Gabapentin]   . Niacin And Related   . Norvasc [Amlodipine Besylate]   . Talwin [Pentazocine]   . Ultram [Tramadol]    VACCINATION STATUS:  There is no immunization history on file for this patient.  Diabetes  She presents for her follow-up diabetic visit. She has type 2 diabetes mellitus. 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  Her  disease course has been improving. There are no hypoglycemic associated symptoms. Pertinent negatives for hypoglycemia include no confusion, headaches, pallor or seizures. Associated symptoms include fatigue. Pertinent negatives for diabetes include no chest pain, no polydipsia, no polyphagia and no polyuria. There are no hypoglycemic complications. Symptoms are improving. Diabetic complications include nephropathy. Her weight is stable (She is initiated on Creon since last visit for probable malabsorption.). She never participates in exercise. There is no change in her home blood glucose trend. Her breakfast blood glucose range is generally 90-110 mg/dl. Her overall blood glucose range is 90-110 mg/dl. An ACE inhibitor/angiotensin II receptor blocker is not being taken.  Hypertension  Pertinent negatives include no chest pain, headaches, palpitations or shortness of breath. Hypertensive end-organ damage includes a thyroid problem.  Hyperlipidemia  Pertinent negatives include no chest pain, myalgias or shortness of breath.  Thyroid Problem  Presents for follow-up visit. Symptoms include fatigue. Patient reports no cold intolerance, diarrhea, heat intolerance or palpitations. The symptoms have been stable. Past treatments include levothyroxine. Her past medical history is significant for hyperlipidemia.     Review of Systems  Constitutional: Positive for fatigue. Negative for unexpected weight change.  HENT: Negative for trouble swallowing and voice change.   Eyes: Negative for visual disturbance.  Respiratory: Negative for cough, shortness of breath and wheezing.   Cardiovascular: Negative for chest pain, palpitations and leg swelling.  Gastrointestinal: Negative for diarrhea, nausea and vomiting.  Endocrine: Negative for cold intolerance, heat intolerance, polydipsia, polyphagia and polyuria.  Musculoskeletal: Negative for arthralgias and myalgias.  Skin: Negative for color change, pallor, rash  and wound.  Neurological: Negative for seizures and headaches.  Psychiatric/Behavioral: Negative for confusion and suicidal ideas.    Objective:    BP 113/73   Pulse 73   Ht _0  (1.6 m)   Wt 92 lb (41.7 kg)   BMI 16.30 kg/m   Wt Readings from Last 3 Encounters:  02/03/16 92 lb (41.7 kg)  01/13/16 92 lb (41.7 kg)  01/05/16 92 lb 9.6 oz (42 kg)    Physical Exam  Constitutional: She is oriented to person, place, and time. She appears well-developed.  HENT:  Head: Normocephalic and atraumatic.  Eyes: EOM are normal.  Neck: Normal range of motion. Neck supple. No tracheal deviation present. No thyromegaly present.  Cardiovascular: Normal rate and regular rhythm.   Pulmonary/Chest: Effort normal and breath sounds normal.  Abdominal: Soft. Bowel sounds are normal. There is no tenderness. There is no guarding.  Musculoskeletal: Normal range of motion. She exhibits no edema.  Neurological: She is alert and oriented to person, place, and time. She has normal reflexes. No cranial nerve deficit. Coordination normal.  Skin: Skin is warm and dry. No rash noted. No erythema. No pallor.  Psychiatric: She has a normal mood and affect. Judgment normal.    Results for orders placed or performed during the hospital encounter of 01/22/16  Ferritin  Result Value Ref Range   Ferritin 1,558 (H) 11 - 307 ng/mL  Hemoglobin and hematocrit, blood  Result Value Ref Range   Hemoglobin 10.9 (L) 12.0 - 15.0 g/dL   HCT 33.1 (L) 36.0 - 46.0 %  Iron and TIBC  Result Value Ref Range   Iron 105 28 - 170 ug/dL   TIBC 216 (L) 250 - 450 ug/dL   Saturation Ratios 49 (H) 10.4 - 31.8 %   UIBC 111 ug/dL   Diabetic Labs (most recent): Lab Results  Component Value Date   HGBA1C 5.0 09/21/2015   HGBA1C 5.2 01/14/2015   HGBA1C 5.9 (H) 10/17/2014   Lipid profile (most recent): Lab Results  Component Value Date   TRIG 208 (H) 10/17/2014   CHOL 138 10/17/2014         Assessment & Plan:   1. 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  Her   diabetes is  complicated by CKD following with Nephrology. Patient came with stable glucose profile, and  recent A1c of 4.7 %.  Glucose logs and insulin administration records pertaining to this visit,  to be scanned into patient's records.  Recent labs reviewed. - Patient remains at a high risk for more acute and chronic complications of diabetes which include CAD, CVA, CKD, retinopathy, and neuropathy. These are all discussed in detail with the patient.  - I have re-counseled the patient on diet management and   by Introducing more carbs and proteins to her diet.   - I have approached patient with the following individualized plan to manage diabetes and patient agrees.  - I will hold insulin   And oral diabetes medications For now . - She will  monitor blood glucose PRN. -Patient is encouraged to call clinic for blood glucose levels less than 70 or above 200 mg /dl. -Patient is not a candidate for metformin norSGLT2 inhibitors due to CKD. - For some unclear reasons she is not continuing on Creon which would have helped her maintain or gain some weight. She will be resumed on  Creon for  pancreatic insufficiency associated with malabsorption. I advised her to take 1 caps with meals  and snacks up to 6 times a day..  - she is not a candidate for incretin therapy . - Patient specific target  for A1c; LDL, HDL, Triglycerides, and  Waist Circumference were discussed in detail.  2) BP/HTN: Controlled.   3)  Weight/Diet: She is encouraged to consume more carbs, protein and fruits and vegetables.  4) Chronic Care/Health Maintenance:  -Patient is  encouraged to continue to follow up with Ophthalmology, Podiatrist at least yearly or according to recommendations, and  advised to stay away from smoking. I have recommended yearly flu vaccine and pneumonia vaccination at least every 5 years; and  sleep for at least 7 hours a day.  I advised patient to maintain close follow up with their PCP for primary care needs  and her nephrologist for CKD.   Patient is asked to bring meter and  blood glucose logs during their next visit.   5) Hypothyroidism, unspecified hypothyroidism type  Continue her levothyroxine at 75 g by mouth every morning. Decision to adjust her levothyroxine would be based on T4 or free T4 in stead of TSH.   - We discussed about correct intake of levothyroxine, at fasting, with water, separated by at least 30 minutes from breakfast, and separated by more than 4 hours from calcium, iron, multivitamins, acid reflux medications (PPIs). -Patient is made aware of the fact that thyroid hormone replacement is needed for life, dose to be adjusted by periodic monitoring of thyroid function tests.  Follow up plan: Return in about 3 months (around 05/04/2016) for follow up with pre-visit labs.  Glade Lloyd, MD Phone: [phone]  Fax: [phone]   02/03/2016, 2:41 PM

## 2016-02-10 ENCOUNTER — Encounter: Payer: Self-pay | Admitting: "Endocrinology

## 2016-02-19 ENCOUNTER — Encounter (HOSPITAL_COMMUNITY): Payer: Medicare Other

## 2016-02-19 ENCOUNTER — Encounter (HOSPITAL_COMMUNITY)
Admission: RE | Admit: 2016-02-19 | Discharge: 2016-02-19 | Disposition: A | Discharge status: Home / Self Care | Payer: Medicare Other | Source: Ambulatory Visit | Attending: Nephrology | Admitting: Nephrology

## 2016-02-19 DIAGNOSIS — D638 Anemia in other chronic diseases classified elsewhere: Secondary | ICD-10-CM | POA: Diagnosis not present

## 2016-02-19 DIAGNOSIS — N183 Chronic kidney disease, stage 3 (moderate): Secondary | ICD-10-CM | POA: Diagnosis present

## 2016-02-19 LAB — HEMOGLOBIN AND HEMATOCRIT, BLOOD
HEMATOCRIT: 35.3 % — AB (ref 36.0–46.0)
Hemoglobin: 11.7 g/dL — ABNORMAL LOW (ref 12.0–15.0)

## 2016-02-19 LAB — FERRITIN: Ferritin: 1817 ng/mL — ABNORMAL HIGH (ref 11–307)

## 2016-02-19 LAB — IRON AND TIBC
IRON: 117 ug/dL (ref 28–170)
Saturation Ratios: 56 % — ABNORMAL HIGH (ref 10.4–31.8)
TIBC: 209 ug/dL — AB (ref 250–450)
UIBC: 92 ug/dL

## 2016-02-19 MED ORDER — DARBEPOETIN ALFA 100 MCG/0.5ML IJ SOSY
100.0000 ug | PREFILLED_SYRINGE | Freq: Once | INTRAMUSCULAR | Status: AC
  Administered 2016-02-19: 100 ug via SUBCUTANEOUS

## 2016-02-19 MED ORDER — DARBEPOETIN ALFA 100 MCG/0.5ML IJ SOSY
PREFILLED_SYRINGE | INTRAMUSCULAR | Status: AC
  Filled 2016-02-19: qty 0.5

## 2016-02-23 NOTE — Progress Notes (Signed)
Results for [name], Samaira T (MRN [id number]) as of 02/23/2016 08:29  Ref. Range 02/19/2016 10:16  Iron Latest Ref Range: 28 - 170 ug/dL 409117  UIBC Latest Units: ug/dL 92  TIBC Latest Ref Range: 250 - 450 ug/dL 811209 (L)  Saturation Ratios Latest Ref Range: 10.4 - 31.8 % 56 (H)  Ferritin Latest Ref Range: 11 - 307 ng/mL 1,817 (H)  Hemoglobin Latest Ref Range: 12.0 - 15.0 g/dL 91.411.7 (L)  HCT Latest Ref Range: 36.0 - 46.0 % 35.3 (L)

## 2016-03-21 ENCOUNTER — Encounter (HOSPITAL_COMMUNITY): Payer: Medicare Other

## 2016-03-21 ENCOUNTER — Encounter (HOSPITAL_COMMUNITY)
Admission: RE | Admit: 2016-03-21 | Discharge: 2016-03-21 | Disposition: A | Discharge status: Home / Self Care | Payer: Medicare Other | Source: Ambulatory Visit | Attending: Nephrology | Admitting: Nephrology

## 2016-03-21 NOTE — Progress Notes (Signed)
Pt called when [place] for scheduled lab draw and aranesp injection. States Dr Primitivo GauzeMichael Mattingly told her she didn't need med anymore.

## 2016-05-11 ENCOUNTER — Ambulatory Visit: Payer: Medicare Other | Admitting: "Endocrinology

## 2016-05-30 ENCOUNTER — Ambulatory Visit: Payer: Medicare Other | Admitting: "Endocrinology

## 2016-06-13 ENCOUNTER — Other Ambulatory Visit: Payer: Self-pay | Admitting: "Endocrinology

## 2016-06-13 DIAGNOSIS — E039 Hypothyroidism, unspecified: Secondary | ICD-10-CM

## 2016-06-13 DIAGNOSIS — E118 Type 2 diabetes mellitus with unspecified complications: Secondary | ICD-10-CM

## 2016-06-13 DIAGNOSIS — E1165 Type 2 diabetes mellitus with hyperglycemia: Secondary | ICD-10-CM

## 2016-06-15 LAB — HEMOGLOBIN A1C
ESTIMATED AVERAGE GLUCOSE: 77 mg/dL
Hgb A1c MFr Bld: 4.3 % — ABNORMAL LOW (ref 4.8–5.6)

## 2016-06-15 LAB — BASIC METABOLIC PANEL
BUN/Creatinine Ratio: 15 (ref 12–28)
BUN: 26 mg/dL (ref 8–27)
CALCIUM: 8.4 mg/dL — AB (ref 8.7–10.3)
CO2: 23 mmol/L (ref 18–29)
Chloride: 104 mmol/L (ref 96–106)
Creatinine, Ser: 1.78 mg/dL — ABNORMAL HIGH (ref 0.57–1.00)
GFR calc Af Amer: 32 mL/min/{1.73_m2} — ABNORMAL LOW (ref >59)
GFR calc non Af Amer: 28 mL/min/{1.73_m2} — ABNORMAL LOW (ref >59)
GLUCOSE: 109 mg/dL — AB (ref 65–99)
Potassium: 4.3 mmol/L (ref 3.5–5.2)
Sodium: 143 mmol/L (ref 134–144)

## 2016-06-15 LAB — TSH: TSH: 0.778 u[IU]/mL (ref 0.450–4.500)

## 2016-06-15 LAB — T4, FREE: FREE T4: 1.23 ng/dL (ref 0.82–1.77)

## 2016-06-21 ENCOUNTER — Ambulatory Visit (INDEPENDENT_AMBULATORY_CARE_PROVIDER_SITE_OTHER): Payer: Medicare Other | Admitting: "Endocrinology

## 2016-06-21 ENCOUNTER — Encounter: Payer: Self-pay | Admitting: "Endocrinology

## 2016-06-21 VITALS — BP 126/79 | HR 75 | Ht 63.0 in | Wt 90.0 lb

## 2016-06-21 DIAGNOSIS — I10 Essential (primary) hypertension: Secondary | ICD-10-CM | POA: Diagnosis not present

## 2016-06-21 DIAGNOSIS — E039 Hypothyroidism, unspecified: Secondary | ICD-10-CM

## 2016-06-21 DIAGNOSIS — K8689 Other specified diseases of pancreas: Secondary | ICD-10-CM

## 2016-06-21 DIAGNOSIS — E118 Type 2 diabetes mellitus with unspecified complications: Secondary | ICD-10-CM | POA: Diagnosis not present

## 2016-06-21 MED ORDER — PANCRELIPASE (LIP-PROT-AMYL) 24000-76000 UNITS PO CPEP: ORAL_CAPSULE | ORAL | 3 refills | Status: DC

## 2016-06-21 NOTE — Progress Notes (Signed)
Subjective:    Patient ID: [name], female    DOB: October 20, 1940,    Past Medical History:  Diagnosis Date  . Anemia   . Arthritis    "knees" (10/16/2014)  . Carotid artery stenosis   . Chronic kidney disease    h/o transplant  . Family history of adverse reaction to anesthesia    "my daughter; they thought she was asleep and she wasn't"  . Frequent UTI   . GERD (gastroesophageal reflux disease)   . History of blood transfusion "several"   "all related to anemia"  . Hyperlipemia   . Hypertension   . Hypothyroidism   . Pneumonia "1-2 times"  . Type II diabetes mellitus ([place])    Past Surgical History:  Procedure Laterality Date  . ABDOMINAL HYSTERECTOMY    . APPENDECTOMY    . BACK SURGERY    . CHOLECYSTECTOMY OPEN    . KIDNEY SURGERY     multiple  . KIDNEY TRANSPLANT Right 2004   at [facility]  . LUMBAR DISC SURGERY     "L5"  . THYROIDECTOMY     1/2 removed  . TONSILLECTOMY    . TUBAL LIGATION     Social History   Social History  . Marital status: Widowed    Spouse name: N/A  . Number of children: 5  . Years of education: N/A   Occupational History  . Retired    Social History Main Topics  . Smoking status: Never Smoker  . Smokeless tobacco: Never Used  . Alcohol use No  . Drug use: No  . Sexual activity: No   Other Topics Concern  . Not on file   Social History Narrative  . No narrative on file   Outpatient Encounter Prescriptions as of 06/21/2016  Medication Sig  . diphenoxylate-atropine (LOMOTIL) 2.5-0.025 MG tablet Take 1 tablet by mouth 4 (four) times daily as needed for diarrhea or loose stools.  Marland Kitchen aspirin 81 MG tablet Take 81 mg by mouth at bedtime.   . Blood Glucose Monitoring Suppl (ACCU-CHEK AVIVA PLUS) w/Device KIT Use as directed 2 x daily  . clonazePAM (KLONOPIN) 1 MG tablet Take 1 mg by mouth at bedtime.  . dicyclomine (BENTYL) 10 MG capsule Take 10 mg by mouth 3 (three) times daily before meals.   Marland Kitchen glucose blood (ACCU-CHEK AVIVA)  test strip Use as instructed bid  . hyoscyamine (LEVSIN, ANASPAZ) 0.125 MG tablet Take 0.125 mg by mouth every 4 (four) hours as needed for cramping.   Marland Kitchen levothyroxine (SYNTHROID, LEVOTHROID) 75 MCG tablet Take 75 mcg by mouth daily before breakfast.  . magnesium oxide (MAG-OX) 400 MG tablet Take 400 mg by mouth daily.  . metoprolol (LOPRESSOR) 50 MG tablet Take 50 mg by mouth 2 (two) times daily.  . mycophenolate (CELLCEPT) 500 MG tablet Take 250 mg by mouth 2 (two) times daily.   . Pancrelipase, Lip-Prot-Amyl, (CREON) 24000-76000 units CPEP 2  Capsule With Meals and  1 capsule snacks.  . pantoprazole (PROTONIX) 40 MG tablet Take 40 mg by mouth 2 (two) times daily.   . sodium bicarbonate 650 MG tablet Take 1 tablet (650 mg total) by mouth 2 (two) times daily. (Patient taking differently: Take 1,300 mg by mouth daily. )  . tacrolimus (PROGRAF) 0.5 MG capsule Take 1 mg by mouth daily.   . [DISCONTINUED] Pancrelipase, Lip-Prot-Amyl, (CREON) 24000-76000 units CPEP 1  Capsule With Meals and snacks up to 6 a Day.   No facility-administered encounter  medications on file as of 06/21/2016.    ALLERGIES: Allergies  Allergen Reactions  . Codeine Nausea And Vomiting  . Elavil [Amitriptyline] Other (See Comments)    unknown  . Iodinated Diagnostic Agents      Renal transplant pt  . Lipitor [Atorvastatin]   . Magnesium Hives    Pt tolerated oral and IV during 06/2014 admission   . Metoclopramide     Other reaction(s): Other (See Comments) "doesn't work"  . Morphine     Other reaction(s): Confusion (intolerance)  . Morphine And Related   . Neurontin [Gabapentin]   . Niacin And Related   . Norvasc [Amlodipine Besylate]   . Talwin [Pentazocine]   . Ultram [Tramadol]    VACCINATION STATUS:  There is no immunization history on file for this patient.  Diabetes  She presents for her follow-up diabetic visit. She has type 2 diabetes mellitus. 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  Her disease course has been improving. There are no hypoglycemic associated symptoms. Pertinent negatives for hypoglycemia include no confusion, headaches, pallor or seizures. Associated symptoms include fatigue. Pertinent negatives for diabetes include no chest pain, no polydipsia, no polyphagia and no polyuria. There are no hypoglycemic complications. Symptoms are improving. Diabetic complications include nephropathy. Her weight is decreasing steadily. She never participates in exercise. There is no change in her home blood glucose trend. Her breakfast blood glucose range is generally 90-110 mg/dl. Her overall blood glucose range is 90-110 mg/dl. An ACE inhibitor/angiotensin II receptor blocker is not being taken.  Hypertension  Pertinent negatives include no chest pain, headaches, palpitations or shortness of breath. Identifiable causes of hypertension include a thyroid problem.  Hyperlipidemia  Pertinent negatives include no chest pain, myalgias or shortness of breath.  Thyroid Problem  Presents for follow-up visit. Symptoms include fatigue. Patient reports no cold intolerance, diarrhea, heat intolerance or palpitations. The symptoms have been stable. Past treatments include levothyroxine. Her past medical history is significant for hyperlipidemia.     Review of Systems  Constitutional: Positive for fatigue. Negative for unexpected weight change.  HENT: Negative for trouble swallowing and voice change.   Eyes: Negative for visual disturbance.  Respiratory: Negative for cough, shortness of breath and wheezing.   Cardiovascular: Negative for chest pain, palpitations and leg swelling.  Gastrointestinal: Negative for diarrhea, nausea and vomiting.  Endocrine: Negative for cold intolerance, heat intolerance, polydipsia, polyphagia and polyuria.  Musculoskeletal: Negative for arthralgias and myalgias.  Skin:  Negative for color change, pallor, rash and wound.  Neurological: Negative for seizures and headaches.  Psychiatric/Behavioral: Negative for confusion and suicidal ideas.    Objective:    BP 126/79   Pulse 75   Ht 5' 3"  (1.6 m)   Wt 90 lb (40.8 kg)   BMI 15.94 kg/m   Wt Readings from Last 3 Encounters:  06/21/16 90 lb (40.8 kg)  02/03/16 92 lb (41.7 kg)  01/13/16 92 lb (41.7 kg)    Physical Exam  Constitutional: She is oriented to person, place, and time. She appears well-developed.  HENT:  Head: Normocephalic and atraumatic.  Eyes: EOM are normal.  Neck: Normal range of motion. Neck supple. No tracheal deviation present. No thyromegaly present.  Cardiovascular: Normal rate and regular rhythm.   Pulmonary/Chest: Effort normal and breath sounds normal.  Abdominal: Soft. Bowel sounds are normal. There is no tenderness. There is no guarding.  Musculoskeletal: Normal range  of motion. She exhibits no edema.  Neurological: She is alert and oriented to person, place, and time. She has normal reflexes. No cranial nerve deficit. Coordination normal.  Skin: Skin is warm and dry. No rash noted. No erythema. No pallor.  Psychiatric: She has a normal mood and affect. Judgment normal.    Results for orders placed or performed in visit on 06/13/16  Hemoglobin A1c  Result Value Ref Range   Hgb A1c MFr Bld 4.3 (L) 4.8 - 5.6 %   Est. average glucose Bld gHb Est-mCnc 77 mg/dL  Basic Metabolic Panel  Result Value Ref Range   Glucose 109 (H) 65 - 99 mg/dL   BUN 26 8 - 27 mg/dL   Creatinine, Ser 1.78 (H) 0.57 - 1.00 mg/dL   GFR calc non Af Amer 28 (L) >59 mL/min/1.73   GFR calc Af Amer 32 (L) >59 mL/min/1.73   BUN/Creatinine Ratio 15 12 - 28   Sodium 143 134 - 144 mmol/L   Potassium 4.3 3.5 - 5.2 mmol/L   Chloride 104 96 - 106 mmol/L   CO2 23 18 - 29 mmol/L   Calcium 8.4 (L) 8.7 - 10.3 mg/dL  TSH  Result Value Ref Range   TSH 0.778 0.450 - 4.500 uIU/mL  T4, Free  Result Value Ref  Range   Free T4 1.23 0.82 - 1.77 ng/dL   Diabetic Labs (most recent): Lab Results  Component Value Date   HGBA1C 4.3 (L) 06/14/2016   HGBA1C 5.0 09/21/2015   HGBA1C 5.2 01/14/2015   Lipid profile (most recent): Lab Results  Component Value Date   TRIG 208 (H) 10/17/2014   CHOL 138 10/17/2014         Assessment & Plan:   1. 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  Her   diabetes is  complicated by CKD following with Nephrology. Patient came with stable glucose profile, and  recent A1c of 4.3 %.  .  Recent labs reviewed. - Patient remains at a high risk for more acute and chronic complications of diabetes which include CAD, CVA, CKD, retinopathy, and neuropathy. These are all discussed in detail with the patient.  - She has significant weight deficit mainly due to pancreatic insufficiency and malabsorption, I have re-counseled the patient on diet management and   by Introducing more carbs and proteins to her diet.   - I have approached patient with the following individualized plan to manage diabetes and patient agrees.  - She would not need any therapy for diabetes at this time. - She will  monitor blood glucose PRN. -Patient is encouraged to call clinic for blood glucose levels less than 70 or above 200 mg /dl. -Patient is not a candidate for metformin norSGLT2 inhibitors due to CKD. -  She will continue to require Creon.  I would increase the dose to 2 capsules per meal and one capsule with snacks. -I encouraged her to maintain her follow-up with GI in [facility]. - she is not a candidate for incretin therapy . - Patient specific target  for A1c; LDL, HDL, Triglycerides, and  Waist Circumference were discussed in detail.  2) BP/HTN: Controlled.   3)  Weight/Diet: She  is encouraged to consume more carbs, protein and fruits and vegetables.  4) Chronic Care/Health Maintenance:  -Patient  is  encouraged to continue to follow up with Ophthalmology, Podiatrist at least yearly or according to recommendations, and advised to stay away from smoking. I have recommended yearly flu vaccine and pneumonia vaccination at least every 5 years; and  sleep for at least 7 hours a day.  I advised patient to maintain close follow up with their PCP for primary care needs and her nephrologist for CKD.   Patient is asked to bring meter and  blood glucose logs during their next visit.   5) Hypothyroidism, unspecified hypothyroidism type  Continue her levothyroxine at 75 g by mouth every morning. Decision to adjust her levothyroxine would be based on T4 or free T4 in stead of TSH.  - We discussed about correct intake of levothyroxine, at fasting, with water, separated by at least 30 minutes from breakfast, and separated by more than 4 hours from calcium, iron, multivitamins, acid reflux medications (PPIs). -Patient is made aware of the fact that thyroid hormone replacement is needed for life, dose to be adjusted by periodic monitoring of thyroid function tests.  Follow up plan: Return in about 6 months (around 12/19/2016) for follow up with pre-visit labs.  Glade Lloyd, MD Phone: [phone]  Fax: [phone]   06/21/2016, 4:02 PM

## 2016-07-08 ENCOUNTER — Ambulatory Visit (HOSPITAL_COMMUNITY)
Admission: RE | Admit: 2016-07-08 | Discharge: 2016-07-08 | Disposition: A | Discharge status: Home / Self Care | Payer: Medicare Other | Source: Ambulatory Visit | Attending: Nurse Practitioner | Admitting: Nurse Practitioner

## 2016-07-08 ENCOUNTER — Other Ambulatory Visit (HOSPITAL_COMMUNITY): Payer: Self-pay | Admitting: Nurse Practitioner

## 2016-07-08 DIAGNOSIS — N644 Mastodynia: Secondary | ICD-10-CM | POA: Insufficient documentation

## 2016-07-08 DIAGNOSIS — R52 Pain, unspecified: Secondary | ICD-10-CM

## 2016-07-12 ENCOUNTER — Encounter: Payer: Self-pay | Admitting: Family

## 2016-07-18 ENCOUNTER — Ambulatory Visit: Payer: Medicare Other | Admitting: Family

## 2016-07-18 ENCOUNTER — Encounter (HOSPITAL_COMMUNITY): Payer: Medicare Other

## 2016-08-09 ENCOUNTER — Encounter (HOSPITAL_COMMUNITY)
Admission: RE | Admit: 2016-08-09 | Discharge: 2016-08-09 | Disposition: A | Discharge status: Home / Self Care | Payer: Medicare Other | Source: Ambulatory Visit | Attending: Nephrology | Admitting: Nephrology

## 2016-08-09 DIAGNOSIS — D631 Anemia in chronic kidney disease: Secondary | ICD-10-CM | POA: Diagnosis present

## 2016-08-09 DIAGNOSIS — N183 Chronic kidney disease, stage 3 (moderate): Secondary | ICD-10-CM | POA: Insufficient documentation

## 2016-08-09 LAB — FERRITIN: Ferritin: 1116 ng/mL — ABNORMAL HIGH (ref 11–307)

## 2016-08-09 LAB — IRON AND TIBC
Iron: 61 ug/dL (ref 28–170)
Saturation Ratios: 27 % (ref 10.4–31.8)
TIBC: 225 ug/dL — ABNORMAL LOW (ref 250–450)
UIBC: 164 ug/dL

## 2016-08-09 LAB — POCT HEMOGLOBIN-HEMACUE: HEMOGLOBIN: 9.2 g/dL — AB (ref 12.0–15.0)

## 2016-08-09 MED ORDER — DARBEPOETIN ALFA 100 MCG/0.5ML IJ SOSY
PREFILLED_SYRINGE | INTRAMUSCULAR | Status: AC
  Filled 2016-08-09: qty 0.5

## 2016-08-09 MED ORDER — DARBEPOETIN ALFA 100 MCG/0.5ML IJ SOSY
100.0000 ug | PREFILLED_SYRINGE | Freq: Once | INTRAMUSCULAR | Status: AC
  Administered 2016-08-09: 100 ug via SUBCUTANEOUS

## 2016-08-23 ENCOUNTER — Encounter (HOSPITAL_COMMUNITY)
Admission: RE | Admit: 2016-08-23 | Discharge: 2016-08-23 | Disposition: A | Discharge status: Home / Self Care | Payer: Medicare Other | Source: Ambulatory Visit | Attending: Nephrology | Admitting: Nephrology

## 2016-08-23 DIAGNOSIS — N183 Chronic kidney disease, stage 3 (moderate): Secondary | ICD-10-CM | POA: Diagnosis not present

## 2016-08-23 DIAGNOSIS — D631 Anemia in chronic kidney disease: Secondary | ICD-10-CM | POA: Diagnosis present

## 2016-08-23 LAB — POCT HEMOGLOBIN-HEMACUE: Hemoglobin: 10 g/dL — ABNORMAL LOW (ref 12.0–15.0)

## 2016-08-23 MED ORDER — DARBEPOETIN ALFA 100 MCG/0.5ML IJ SOSY
100.0000 ug | PREFILLED_SYRINGE | Freq: Once | INTRAMUSCULAR | Status: AC
  Administered 2016-08-23: 100 ug via SUBCUTANEOUS

## 2016-08-23 MED ORDER — DARBEPOETIN ALFA 100 MCG/0.5ML IJ SOSY
PREFILLED_SYRINGE | INTRAMUSCULAR | Status: AC
  Filled 2016-08-23: qty 0.5

## 2016-08-24 NOTE — Progress Notes (Signed)
Results for RANADA, VIGORITO (MRN [id number]) as of 08/24/2016 15:59  Ref. Range 08/23/2016 10:53  Hemoglobin Latest Ref Range: 12.0 - 15.0 g/dL 40.9 (L)

## 2016-08-25 ENCOUNTER — Encounter: Payer: Self-pay | Admitting: Family

## 2016-09-05 ENCOUNTER — Encounter (HOSPITAL_COMMUNITY): Payer: Medicare Other

## 2016-09-05 ENCOUNTER — Ambulatory Visit: Payer: Medicare Other | Admitting: Family

## 2016-09-06 ENCOUNTER — Encounter (HOSPITAL_COMMUNITY)
Admission: RE | Admit: 2016-09-06 | Discharge: 2016-09-06 | Disposition: A | Discharge status: Home / Self Care | Payer: Medicare Other | Source: Ambulatory Visit | Attending: Nephrology | Admitting: Nephrology

## 2016-09-06 ENCOUNTER — Encounter (HOSPITAL_COMMUNITY): Payer: Self-pay

## 2016-09-06 DIAGNOSIS — N183 Chronic kidney disease, stage 3 (moderate): Secondary | ICD-10-CM | POA: Diagnosis not present

## 2016-09-06 LAB — IRON AND TIBC
IRON: 62 ug/dL (ref 28–170)
Saturation Ratios: 27 % (ref 10.4–31.8)
TIBC: 234 ug/dL — AB (ref 250–450)
UIBC: 172 ug/dL

## 2016-09-06 LAB — POCT HEMOGLOBIN-HEMACUE: Hemoglobin: 10.4 g/dL — ABNORMAL LOW (ref 12.0–15.0)

## 2016-09-06 LAB — FERRITIN: Ferritin: 1085 ng/mL — ABNORMAL HIGH (ref 11–307)

## 2016-09-06 MED ORDER — DARBEPOETIN ALFA 100 MCG/0.5ML IJ SOSY
100.0000 ug | PREFILLED_SYRINGE | Freq: Once | INTRAMUSCULAR | Status: AC
  Administered 2016-09-06: 100 ug via SUBCUTANEOUS

## 2016-09-06 MED ORDER — DARBEPOETIN ALFA 100 MCG/0.5ML IJ SOSY
PREFILLED_SYRINGE | INTRAMUSCULAR | Status: AC
  Filled 2016-09-06: qty 0.5

## 2016-09-20 ENCOUNTER — Encounter (HOSPITAL_COMMUNITY)
Admission: RE | Admit: 2016-09-20 | Discharge: 2016-09-20 | Disposition: A | Discharge status: Home / Self Care | Payer: Medicare Other | Source: Ambulatory Visit | Attending: Nephrology | Admitting: Nephrology

## 2016-09-20 DIAGNOSIS — Z01812 Encounter for preprocedural laboratory examination: Secondary | ICD-10-CM | POA: Diagnosis present

## 2016-09-20 DIAGNOSIS — D631 Anemia in chronic kidney disease: Secondary | ICD-10-CM | POA: Diagnosis not present

## 2016-09-20 DIAGNOSIS — N183 Chronic kidney disease, stage 3 (moderate): Secondary | ICD-10-CM | POA: Insufficient documentation

## 2016-09-20 LAB — POCT HEMOGLOBIN-HEMACUE: HEMOGLOBIN: 11.4 g/dL — AB (ref 12.0–15.0)

## 2016-09-20 MED ORDER — DARBEPOETIN ALFA 100 MCG/0.5ML IJ SOSY
100.0000 ug | PREFILLED_SYRINGE | Freq: Once | INTRAMUSCULAR | Status: AC
  Administered 2016-09-20: 100 ug via SUBCUTANEOUS

## 2016-09-20 MED ORDER — DARBEPOETIN ALFA 100 MCG/0.5ML IJ SOSY
PREFILLED_SYRINGE | INTRAMUSCULAR | Status: AC
  Filled 2016-09-20: qty 0.5

## 2016-09-22 NOTE — Progress Notes (Signed)
Results for Deanna ShortsWINDSOR, [name] (MRN [id number]) as of 09/22/2016 14:49  Ref. Range 09/20/2016 10:26  Hemoglobin Latest Ref Range: 12.0 - 15.0 g/dL 11.911.4 (L)

## 2016-10-04 ENCOUNTER — Encounter (HOSPITAL_COMMUNITY)
Admission: RE | Admit: 2016-10-04 | Discharge: 2016-10-04 | Disposition: A | Discharge status: Home / Self Care | Payer: Medicare Other | Source: Ambulatory Visit | Attending: Nephrology | Admitting: Nephrology

## 2016-10-04 ENCOUNTER — Encounter (HOSPITAL_COMMUNITY): Payer: Self-pay

## 2016-10-04 DIAGNOSIS — Z01812 Encounter for preprocedural laboratory examination: Secondary | ICD-10-CM | POA: Diagnosis not present

## 2016-10-04 LAB — FERRITIN: Ferritin: 1258 ng/mL — ABNORMAL HIGH (ref 11–307)

## 2016-10-04 LAB — IRON AND TIBC
IRON: 64 ug/dL (ref 28–170)
Saturation Ratios: 26 % (ref 10.4–31.8)
TIBC: 249 ug/dL — AB (ref 250–450)
UIBC: 185 ug/dL

## 2016-10-04 MED ORDER — DARBEPOETIN ALFA 100 MCG/0.5ML IJ SOSY: 100.0000 ug | PREFILLED_SYRINGE | INTRAMUSCULAR | Status: DC

## 2016-10-05 LAB — POCT HEMOGLOBIN-HEMACUE: Hemoglobin: 13.9 g/dL (ref 12.0–15.0)

## 2016-10-16 ENCOUNTER — Encounter (HOSPITAL_COMMUNITY): Payer: Self-pay | Admitting: Emergency Medicine

## 2016-10-16 ENCOUNTER — Emergency Department (HOSPITAL_COMMUNITY)
Admission: EM | Admit: 2016-10-16 | Discharge: 2016-10-16 | Disposition: A | Discharge status: Home / Self Care | Payer: Medicare Other | Attending: Emergency Medicine | Admitting: Emergency Medicine

## 2016-10-16 DIAGNOSIS — E1122 Type 2 diabetes mellitus with diabetic chronic kidney disease: Secondary | ICD-10-CM | POA: Diagnosis not present

## 2016-10-16 DIAGNOSIS — I129 Hypertensive chronic kidney disease with stage 1 through stage 4 chronic kidney disease, or unspecified chronic kidney disease: Secondary | ICD-10-CM | POA: Diagnosis not present

## 2016-10-16 DIAGNOSIS — R21 Rash and other nonspecific skin eruption: Secondary | ICD-10-CM | POA: Diagnosis present

## 2016-10-16 DIAGNOSIS — E039 Hypothyroidism, unspecified: Secondary | ICD-10-CM | POA: Diagnosis not present

## 2016-10-16 DIAGNOSIS — Z7982 Long term (current) use of aspirin: Secondary | ICD-10-CM | POA: Diagnosis not present

## 2016-10-16 DIAGNOSIS — B029 Zoster without complications: Secondary | ICD-10-CM

## 2016-10-16 DIAGNOSIS — N184 Chronic kidney disease, stage 4 (severe): Secondary | ICD-10-CM | POA: Insufficient documentation

## 2016-10-16 DIAGNOSIS — R079 Chest pain, unspecified: Secondary | ICD-10-CM | POA: Diagnosis not present

## 2016-10-16 MED ORDER — HYDROCODONE-ACETAMINOPHEN 5-325 MG PO TABS: 1.0000 | ORAL_TABLET | ORAL | 0 refills | Status: DC | PRN

## 2016-10-16 MED ORDER — ACYCLOVIR 400 MG PO TABS: 800.0000 mg | ORAL_TABLET | Freq: Every day | ORAL | 0 refills | Status: DC

## 2016-10-16 MED ORDER — ONDANSETRON 4 MG PO TBDP
4.0000 mg | ORAL_TABLET | Freq: Once | ORAL | Status: AC
  Administered 2016-10-16: 4 mg via ORAL
  Filled 2016-10-16: qty 1

## 2016-10-16 MED ORDER — ACYCLOVIR 800 MG PO TABS
800.0000 mg | ORAL_TABLET | Freq: Once | ORAL | Status: AC
  Administered 2016-10-16: 800 mg via ORAL
  Filled 2016-10-16: qty 1

## 2016-10-16 MED ORDER — HYDROCODONE-ACETAMINOPHEN 5-325 MG PO TABS
2.0000 | ORAL_TABLET | Freq: Once | ORAL | Status: AC
  Administered 2016-10-16: 2 via ORAL
  Filled 2016-10-16: qty 2

## 2016-10-16 MED ORDER — ONDANSETRON 4 MG PO TBDP: 4.0000 mg | ORAL_TABLET | Freq: Three times a day (TID) | ORAL | 0 refills | Status: DC | PRN

## 2016-10-16 MED ORDER — PREDNISONE 10 MG PO TABS: 10.0000 mg | ORAL_TABLET | Freq: Every day | ORAL | 0 refills | Status: DC

## 2016-10-16 NOTE — ED Triage Notes (Signed)
Pt reports pain on right side of chest into back since yesterday morning with a rash on that side.  Appears to be shingles.

## 2016-10-16 NOTE — ED Provider Notes (Signed)
[name] Provider Note   CSN: [id number] Arrival date & time: 10/16/16  0741     History   Chief Complaint Chief Complaint  Patient presents with  . Chest Pain  . Rash    HPI [name] is a 76 y.o. female.  Patient complains of pain and rash in her right mid back and right anterior chest since this morning. No crushing substernal chest pain, dyspnea, diaphoresis, nausea. She is status post kidney transplant. She has tried nothing for the pain. Nothing makes symptoms better or worse      Past Medical History:  Diagnosis Date  . Anemia   . Arthritis    "knees" (10/16/2014)  . Carotid artery stenosis   . Chronic kidney disease    h/o transplant  . Family history of adverse reaction to anesthesia    "my daughter; they thought she was asleep and she wasn't"  . Frequent UTI   . GERD (gastroesophageal reflux disease)   . History of blood transfusion "several"   "all related to anemia"  . Hyperlipemia   . Hypertension   . Hypothyroidism   . Pneumonia "1-2 times"  . Type II diabetes mellitus [facility])     Patient Active Problem List   Diagnosis Date Noted  . Thrombocytopenia ([place]) 01/06/2016  . Nodule of left lung 01/06/2016  . Gram-negative bacteremia 01/06/2016  . AKI (acute kidney injury) ([place]) 12/12/2015  . Diabetes mellitus ([place]) 12/12/2015  . CKD (chronic kidney disease) stage 4, GFR 15-29 ml/min ([facility]) 12/12/2015  . Dehydration 12/12/2015  . Pancreatic insufficiency 09/28/2015  . Type 2 diabetes mellitus with stage 3 chronic kidney disease, with long-term current use of insulin ([place]) 02/23/2015  . Chest pain 10/18/2014  . Nausea with vomiting 10/18/2014  . SOB (shortness of breath) 10/18/2014  . Demand ischemia ([place]) 10/18/2014  . 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 Pain in the chest   . Skin infection   . Carotid artery stenosis 10/17/2014  . Malnutrition of moderate degree ([place]) 10/17/2014  . Sepsis ([place]) 10/17/2014  . ARF  (acute renal failure) ([place])   . Cellulitis 10/16/2014  . Cellulitis of right groin 10/16/2014  . GERD (gastroesophageal reflux disease)   . Hypothyroidism   . History of kidney transplant   . Hyperkalemia   . UTI (lower urinary tract infection)   . Atrial fibrillation ([place]) 05/25/2009  . Anemia 05/06/2009  . Essential hypertension 05/06/2009  . Hyperlipidemia 10/28/2008    Past Surgical History:  Procedure Laterality Date  . ABDOMINAL HYSTERECTOMY    . APPENDECTOMY    . BACK SURGERY    . CHOLECYSTECTOMY OPEN    . KIDNEY SURGERY     multiple  . KIDNEY TRANSPLANT Right 2004   at [facility]  . LUMBAR DISC SURGERY     "L5"  . THYROIDECTOMY     1/2 removed  . TONSILLECTOMY    . TUBAL LIGATION      OB History    No data available       Home Medications    Prior to Admission medications   Medication Sig Start Date End Date Taking? Authorizing Provider  acyclovir (ZOVIRAX) 400 MG tablet Take 2 tablets (800 mg total) by mouth 5 (five) times daily. 10/16/16   Nat Christen, MD  aspirin 81 MG tablet Take 81 mg by mouth at bedtime.     [provider]  Blood Glucose Monitoring Suppl (ACCU-CHEK AVIVA PLUS) w/Device KIT Use as directed 2 x daily  06/19/15   Cassandria Anger, MD  clonazePAM (KLONOPIN) 1 MG tablet Take 1 mg by mouth at bedtime.    [provider]  dicyclomine (BENTYL) 10 MG capsule Take 10 mg by mouth 3 (three) times daily before meals.     [provider]  diphenoxylate-atropine (LOMOTIL) 2.5-0.025 MG tablet Take 1 tablet by mouth 4 (four) times daily as needed for diarrhea or loose stools.    [provider]  glucose blood (ACCU-CHEK AVIVA) test strip Use as instructed bid 06/29/15   Cassandria Anger, MD  HYDROcodone-acetaminophen (NORCO/VICODIN) 5-325 MG tablet Take 1-2 tablets by mouth every 4 (four) hours as needed. 10/16/16   Nat Christen, MD  hyoscyamine (LEVSIN, ANASPAZ) 0.125 MG tablet Take 0.125 mg by mouth every 4 (four)  hours as needed for cramping.     [provider]  levothyroxine (SYNTHROID, LEVOTHROID) 75 MCG tablet Take 75 mcg by mouth daily before breakfast.    [provider]  magnesium oxide (MAG-OX) 400 MG tablet Take 400 mg by mouth daily.    [provider]  metoprolol (LOPRESSOR) 50 MG tablet Take 50 mg by mouth 2 (two) times daily.    [provider]  mycophenolate (CELLCEPT) 500 MG tablet Take 250 mg by mouth 2 (two) times daily.     [provider]  ondansetron (ZOFRAN ODT) 4 MG disintegrating tablet Take 1 tablet (4 mg total) by mouth every 8 (eight) hours as needed for nausea or vomiting. 10/16/16   Nat Christen, MD  Pancrelipase, Lip-Prot-Amyl, (CREON) 24000-76000 units CPEP 2  Capsule With Meals and  1 capsule snacks. 06/21/16   Cassandria Anger, MD  pantoprazole (PROTONIX) 40 MG tablet Take 40 mg by mouth 2 (two) times daily.  10/11/14   [provider]  predniSONE (DELTASONE) 10 MG tablet Take 1 tablet (10 mg total) by mouth daily with breakfast. 10/16/16   Nat Christen, MD  sodium bicarbonate 650 MG tablet Take 1 tablet (650 mg total) by mouth 2 (two) times daily. Patient taking differently: Take 1,300 mg by mouth daily.  10/21/14   Eugenie Filler, MD  tacrolimus (PROGRAF) 0.5 MG capsule Take 1 mg by mouth daily.     [provider]    Family History Family History  Problem Relation Age of Onset  . Diabetes Mother   . Hypertension Mother   . Melanoma Mother   . Hyperlipidemia Mother   . Heart disease Father   . Varicose Veins Father   . Hypertension Sister   . Cancer Daughter   . Hypertension Son   . Hyperlipidemia Sister   . Hyperlipidemia Daughter   . Hyperlipidemia Son   . Hypertension Daughter     Social History Social History  Substance Use Topics  . Smoking status: Never Smoker  . Smokeless tobacco: Never Used  . Alcohol use No     Allergies   Codeine; Elavil [amitriptyline]; Iodinated diagnostic  agents; Lipitor [atorvastatin]; Magnesium; Metoclopramide; Morphine; Morphine and related; Neurontin [gabapentin]; Niacin and related; Norvasc [amlodipine besylate]; Talwin [pentazocine]; and Ultram [tramadol]   Review of Systems Review of Systems  All other systems reviewed and are negative.    Physical Exam Updated Vital Signs BP (!) 146/68 (BP Location: Right Arm)   Pulse 71   Temp 98.2 F (36.8 C) (Oral)   Resp 14   Ht _0  (1.6 m)   Wt 39.9 kg (88 lb)   SpO2 100%   BMI 15.59 kg/m  Physical Exam  Constitutional: She is oriented to person, place, and time. She appears well-developed and well-nourished.  HENT:  Head: Normocephalic and atraumatic.  Eyes: Conjunctivae are normal.  Neck: Neck supple.  Cardiovascular: Normal rate and regular rhythm.   Pulmonary/Chest: Effort normal and breath sounds normal.  Abdominal: Soft. Bowel sounds are normal.  Musculoskeletal: Normal range of motion.  Neurological: She is alert and oriented to person, place, and time.  Skin:  Erythematous papular rash in the distribution of the right T6 dermatome, more significant anteriorly than posteriorly  Psychiatric: She has a normal mood and affect. Her behavior is normal.  Nursing note and vitals reviewed.    ED Treatments / Results  Labs (all labs ordered are listed, but only abnormal results are displayed) Labs Reviewed - No data to display  EKG  EKG Interpretation None       Radiology No results found.  Procedures Procedures (including critical care time)  Medications Ordered in ED Medications  HYDROcodone-acetaminophen (NORCO/VICODIN) 5-325 MG per tablet 2 tablet (2 tablets Oral Given 10/16/16 0815)  ondansetron (ZOFRAN-ODT) disintegrating tablet 4 mg (4 mg Oral Given 10/16/16 0816)  acyclovir (ZOVIRAX) tablet 800 mg (800 mg Oral Given 10/16/16 0836)     Initial Impression / Assessment and Plan / ED Course  I have reviewed the triage vital signs and the nursing  notes.  Pertinent labs & imaging results that were available during my care of the patient were reviewed by me and considered in my medical decision making (see chart for details).     History and physical most consistent with shingles. Patient is hemodynamically stable. Will Rx acyclovir 800 mg, prednisone, Vicodin.  Final Clinical Impressions(s) / ED Diagnoses   Final diagnoses:  Herpes zoster without complication    New Prescriptions Discharge Medication List as of 10/16/2016  8:28 AM    START taking these medications   Details  acyclovir (ZOVIRAX) 400 MG tablet Take 2 tablets (800 mg total) by mouth 5 (five) times daily., Starting Sun 10/16/2016, Print    HYDROcodone-acetaminophen (NORCO/VICODIN) 5-325 MG tablet Take 1-2 tablets by mouth every 4 (four) hours as needed., Starting Sun 10/16/2016, Print    predniSONE (DELTASONE) 10 MG tablet Take 1 tablet (10 mg total) by mouth daily with breakfast., Starting Sun 10/16/2016, Print         Nat Christen, MD 10/16/16 [phone]

## 2016-10-16 NOTE — Discharge Instructions (Signed)
You have shingles. Prescription for pain medicine, prednisone, antiviral medication. Follow-up your primary care doctor.

## 2016-10-18 ENCOUNTER — Encounter (HOSPITAL_COMMUNITY): Payer: Medicare Other

## 2016-10-18 ENCOUNTER — Encounter (HOSPITAL_COMMUNITY): Admission: RE | Admit: 2016-10-18 | Payer: Medicare Other | Source: Ambulatory Visit

## 2016-10-28 ENCOUNTER — Encounter: Payer: Self-pay | Admitting: Family

## 2016-11-08 ENCOUNTER — Encounter (HOSPITAL_COMMUNITY): Payer: Medicare Other

## 2016-11-09 ENCOUNTER — Ambulatory Visit: Payer: Medicare Other | Admitting: Family

## 2016-12-05 ENCOUNTER — Encounter: Payer: Self-pay | Admitting: Family

## 2016-12-15 LAB — TSH: TSH: 1.7 u[IU]/mL (ref 0.450–4.500)

## 2016-12-15 LAB — T4, FREE: FREE T4: 1.11 ng/dL (ref 0.82–1.77)

## 2016-12-15 LAB — HEMOGLOBIN A1C
Est. average glucose Bld gHb Est-mCnc: 94 mg/dL
HEMOGLOBIN A1C: 4.9 % (ref 4.8–5.6)

## 2016-12-19 ENCOUNTER — Encounter: Payer: Self-pay | Admitting: Family

## 2016-12-19 ENCOUNTER — Ambulatory Visit (HOSPITAL_COMMUNITY)
Admission: RE | Admit: 2016-12-19 | Discharge: 2016-12-19 | Disposition: A | Discharge status: Home / Self Care | Payer: Medicare Other | Source: Ambulatory Visit | Attending: Family | Admitting: Family

## 2016-12-19 ENCOUNTER — Ambulatory Visit (INDEPENDENT_AMBULATORY_CARE_PROVIDER_SITE_OTHER): Payer: Medicare Other | Admitting: Surgery

## 2016-12-19 VITALS — BP 129/61 | HR 72 | Temp 97.3°F | Resp 16 | Ht 63.0 in | Wt 91.0 lb

## 2016-12-19 DIAGNOSIS — I6523 Occlusion and stenosis of bilateral carotid arteries: Secondary | ICD-10-CM | POA: Diagnosis present

## 2016-12-19 DIAGNOSIS — I6521 Occlusion and stenosis of right carotid artery: Secondary | ICD-10-CM | POA: Diagnosis not present

## 2016-12-19 DIAGNOSIS — Z940 Kidney transplant status: Secondary | ICD-10-CM | POA: Diagnosis not present

## 2016-12-19 LAB — VAS US CAROTID
LCCADDIAS: 27 cm/s
LCCAPDIAS: 22 cm/s
LEFT ECA DIAS: -15 cm/s
LICADDIAS: -47 cm/s
LICADSYS: -119 cm/s
LICAPDIAS: -50 cm/s
LICAPSYS: -142 cm/s
Left CCA dist sys: 80 cm/s
Left CCA prox sys: 88 cm/s
RCCAPSYS: 98 cm/s
RIGHT CCA MID DIAS: 16 cm/s
RIGHT ECA DIAS: -11 cm/s
Right CCA prox dias: 22 cm/s
Right cca dist sys: -111 cm/s

## 2016-12-19 NOTE — Progress Notes (Signed)
Chief Complaint: Follow up Extracranial Carotid Artery Stenosis   History of Present Illness  Deanna WARSHAWSKY is a 76 y.o. female who returns today for follow-up. Dr. Trula Slade initially met her in February 2016 for evaluation of carotid artery stenosis. She had a carotid ultrasound performed at another facility that revealed greater than 70% right carotid artery stenosis and less than 50% left carotid stenosis. Ultrasound in our office identified 60-79 percent right carotid artery stenosis. Medical management was recommended given that she was asymptomatic.   She was hospitalized for a kidney infection in 2016. She also suffered aspiration pneumonia which led to intubation. She has recovered from this. She also lost a significant amount of weight and is trying to gain that back. She denies post prandial abdominal pian, denies food fear. She is losing weight but was again recently hospitalized for kidney infection from E.coli. She was discharge from the hospital a few days ago and is taking oral antibx.    The patient has a history of a kidney transplant, managed by Dr. Mercy Moore. 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 She had a right-sided catheter for 2 months prior to her living related transplant. The patient is intolerant of statins as these cause leg pain. She is medically managed for hypertension. She is a nonsmoker.  Patient has nothad previous carotid artery intervention.  The patient denies any history of TIA or stroke symptoms, specifically the patient denies a history of amaurosis fugax or monocular blindness, denies a history unilateral of facial drooping, denies a history of hemiplegia, and denies a history of receptive or expressive aphasia.   Pt denies any claudication sx's with walking.   She had iron deficiency anemia, received iron infusions on 3 separate occasions, the last was January 2017, is also receiving Aranesp.   Pt Diabetic: August 2016 A1C  was 5.2 (review of records); she states she did not have DM until after the transplant.  Pt smoker: non-smoker  Pt meds include: Statin : no, causes leg pain ASA: yes Other anticoagulants/antiplatelets: no   Past Medical History:  Diagnosis Date  . Anemia   . Arthritis    "knees" (10/16/2014)  . Carotid artery stenosis   . Chronic kidney disease    h/o transplant  . Family history of adverse reaction to anesthesia    "my daughter; they thought she was asleep and she wasn't"  . Frequent UTI   . GERD (gastroesophageal reflux disease)   . History of blood transfusion "several"   "all related to anemia"  . Hyperlipemia   . Hypertension   . Hypothyroidism   . Pneumonia "1-2 times"  . Shingles   . Type II diabetes mellitus ([place])     Social History Social History  Substance Use Topics  . Smoking status: Never Smoker  . Smokeless tobacco: Never Used  . Alcohol use No    Family History Family History  Problem Relation Age of Onset  . Diabetes Mother   . Hypertension Mother   . Melanoma Mother   . Hyperlipidemia Mother   . Heart disease Father   . Varicose Veins Father   . Hypertension Sister   . Cancer Daughter   . Hypertension Son   . Hyperlipidemia Sister   . Hyperlipidemia Daughter   . Hyperlipidemia Son   . Hypertension Daughter     Surgical History Past Surgical History:  Procedure Laterality Date  . ABDOMINAL HYSTERECTOMY    . APPENDECTOMY    . BACK SURGERY    .  CHOLECYSTECTOMY OPEN    . KIDNEY SURGERY     multiple  . KIDNEY TRANSPLANT Right 2004   at [facility]  . LUMBAR DISC SURGERY     "L5"  . THYROIDECTOMY     1/2 removed  . TONSILLECTOMY    . TUBAL LIGATION      Allergies  Allergen Reactions  . Codeine Nausea And Vomiting  . Elavil [Amitriptyline] Other (See Comments)    unknown  . Iodinated Diagnostic Agents      Renal transplant pt  . Lipitor [Atorvastatin]   . Magnesium Hives    Pt tolerated oral and IV during 06/2014 admission   .  Metoclopramide     Other reaction(s): Other (See Comments) "doesn't work"  . Morphine     Other reaction(s): Confusion (intolerance)  . Morphine And Related   . Neurontin [Gabapentin]   . Niacin And Related   . Norvasc [Amlodipine Besylate]   . Talwin [Pentazocine]   . Ultram [Tramadol]     Current Outpatient Prescriptions  Medication Sig Dispense Refill  . aspirin 81 MG tablet Take 81 mg by mouth at bedtime.     . clonazePAM (KLONOPIN) 1 MG tablet Take 1 mg by mouth at bedtime.    . dicyclomine (BENTYL) 10 MG capsule Take 10 mg by mouth 3 (three) times daily before meals.     . diphenoxylate-atropine (LOMOTIL) 2.5-0.025 MG tablet Take 1 tablet by mouth 4 (four) times daily as needed for diarrhea or loose stools.    . hyoscyamine (LEVSIN, ANASPAZ) 0.125 MG tablet Take 0.125 mg by mouth every 4 (four) hours as needed for cramping.     Marland Kitchen levothyroxine (SYNTHROID, LEVOTHROID) 75 MCG tablet Take 75 mcg by mouth daily before breakfast.    . magnesium oxide (MAG-OX) 400 MG tablet Take 400 mg by mouth daily.    . metoprolol (LOPRESSOR) 50 MG tablet Take 50 mg by mouth 2 (two) times daily.    . mycophenolate (CELLCEPT) 500 MG tablet Take 250 mg by mouth 2 (two) times daily.     . ondansetron (ZOFRAN ODT) 4 MG disintegrating tablet Take 1 tablet (4 mg total) by mouth every 8 (eight) hours as needed for nausea or vomiting. 15 tablet 0  . pantoprazole (PROTONIX) 40 MG tablet Take 40 mg by mouth 2 (two) times daily.   5  . pregabalin (LYRICA) 50 MG capsule Take 50 mg by mouth 1 day or 1 dose.    . sodium bicarbonate 650 MG tablet Take 1 tablet (650 mg total) by mouth 2 (two) times daily. (Patient taking differently: Take 1,300 mg by mouth daily. ) 60 tablet 0  . tacrolimus (PROGRAF) 0.5 MG capsule Take 1 mg by mouth daily.     Marland Kitchen acyclovir (ZOVIRAX) 400 MG tablet Take 2 tablets (800 mg total) by mouth 5 (five) times daily. (Patient not taking: Reported on 12/19/2016) 35 tablet 0  . Blood Glucose  Monitoring Suppl (ACCU-CHEK AVIVA PLUS) w/Device KIT Use as directed 2 x daily (Patient not taking: Reported on 12/19/2016) 1 kit 0  . glucose blood (ACCU-CHEK AVIVA) test strip Use as instructed bid (Patient not taking: Reported on 12/19/2016) 100 each 5  . HYDROcodone-acetaminophen (NORCO/VICODIN) 5-325 MG tablet Take 1-2 tablets by mouth every 4 (four) hours as needed. (Patient not taking: Reported on 12/19/2016) 20 tablet 0  . Pancrelipase, Lip-Prot-Amyl, (CREON) 24000-76000 units CPEP 2  Capsule With Meals and  1 capsule snacks. (Patient not taking: Reported on 12/19/2016) 270 capsule 3  .  predniSONE (DELTASONE) 10 MG tablet Take 1 tablet (10 mg total) by mouth daily with breakfast. (Patient not taking: Reported on 12/19/2016) 15 tablet 0   No current facility-administered medications for this visit.     Review of Systems : See HPI for pertinent positives and negatives.  Physical Examination  Vitals:   12/19/16 1044 12/19/16 1046  BP: 130/89 129/61  Pulse: 72   Resp: 16   Temp: (!) 97.3 F (36.3 C)   TempSrc: Oral   SpO2: 100%   Weight: 91 lb (41.3 kg)   Height: _0  (1.6 m)    Body mass index is 16.12 kg/m.  General: WDWN thin female in NAD GAIT:normal Eyes: PERRLA Pulmonary: Respirations are non-labored, CTAB, good air movement      Cardiac: regular rhythm and rate, no detected murmur.  VASCULAR EXAM Carotid Bruits Right Left   positive positive  Aorta is not palpable. Radial pulses are 2+ palpable and equal.   LE Pulses Right Left  POPLITEAL 1+ palpable 1+palpable  POSTERIOR TIBIAL 2+palpable 2+ palpable  DORSALIS PEDIS ANTERIOR TIBIAL 1+palpable not palpable    Gastrointestinal: soft, nontender, BS WNL, no r/g, no palpable masses.  Musculoskeletal: no muscle atrophy/wasting. M/S 5/5 throughout, extremities without ischemic changes.  Neurologic: A&O X 3; Appropriate Affect, Speech is normal CN 2-12  intact, pain and light touch intact in extremities, Motor exam as listed above.     Assessment: NARE GASPARI is a 76 y.o. female who has no history of stroke or TIA. Dr. Trula Slade spoke with pt and daughter and examined pt.   DATA Today's carotid duplex suggests 80-99% right ICA stenosis. The left ICA has 40-59% stenosis.  Bilateral vertebral artery flow is antegrade.  Bilateral subclavian artery waveforms are normal.  Increased stenosis of bilateral internal carotid arteries compared to the last exam on 01-13-16.     Plan: Will request cardiac risk stratification prior to scheduling right CEA. Will also request clearance from her nephrologist, Dr. Mercy Moore, due to  Will anticipate 01-18-17 as right CEA surgical date, by Dr. Trula Slade.    I discussed in depth with the patient the nature of atherosclerosis, and emphasized the importance of maximal medical management including strict control of blood pressure, blood glucose, and lipid levels, obtaining regular exercise, and continued cessation of smoking.  The patient is aware that without maximal medical management the underlying atherosclerotic disease process will progress, limiting the benefit of any interventions. The patient was given information about stroke prevention and what symptoms should prompt the patient to seek immediate medical care. Thank you for allowing [place] to participate in this patient's care.  Clemon Chambers, RN, MSN, FNP-C [facility] Office: [place]: Trula Slade  12/19/16 10:49 AM   I agree with the above.  I have seen and evaluated the patient.  She has had interval progression of her carotid stenosis on the right.  It is now greater than 80%.  She remains asymptomatic.  Specifically, she denies numbness or weakness in either extremity.  She denies slurred speech.  She denies amaurosis fugax.  We discussed treatment options and have decided to proceed with  right carotid endarterectomy.  The risks and benefits of the operation were discussed with the patient which include but are not limited to the risk of stroke, nerve injury, and cardiopulmonary complications.  I am going to have her evaluated by cardiology as well as nephrology for clearance.  We have tentatively scheduled her operation for Wednesday,  September 5.  Annamarie Major

## 2016-12-19 NOTE — Patient Instructions (Signed)
Stroke Prevention Some medical conditions and behaviors are associated with an increased chance of having a stroke. You may prevent a stroke by making healthy choices and managing medical conditions. How can I reduce my risk of having a stroke?  Stay physically active. Get at least 30 minutes of activity on most or all days.  Do not smoke. It may also be helpful to avoid exposure to secondhand smoke.  Limit alcohol use. Moderate alcohol use is considered to be: ? No more than 2 drinks per day for men. ? No more than 1 drink per day for nonpregnant women.  Eat healthy foods. This involves: ? Eating 5 or more servings of fruits and vegetables a day. ? Making dietary changes that address high blood pressure (hypertension), high cholesterol, diabetes, or obesity.  Manage your cholesterol levels. ? Making food choices that are high in fiber and low in saturated fat, trans fat, and cholesterol may control cholesterol levels. ? Take any prescribed medicines to control cholesterol as directed by your health care provider.  Manage your diabetes. ? Controlling your carbohydrate and sugar intake is recommended to manage diabetes. ? Take any prescribed medicines to control diabetes as directed by your health care provider.  Control your hypertension. ? Making food choices that are low in salt (sodium), saturated fat, trans fat, and cholesterol is recommended to manage hypertension. ? Ask your health care provider if you need treatment to lower your blood pressure. Take any prescribed medicines to control hypertension as directed by your health care provider. ? If you are 18-39 years of age, have your blood pressure checked every 3-5 years. If you are 40 years of age or older, have your blood pressure checked every year.  Maintain a healthy weight. ? Reducing calorie intake and making food choices that are low in sodium, saturated fat, trans fat, and cholesterol are recommended to manage  weight.  Stop drug abuse.  Avoid taking birth control pills. ? Talk to your health care provider about the risks of taking birth control pills if you are over 35 years old, smoke, get migraines, or have ever had a blood clot.  Get evaluated for sleep disorders (sleep apnea). ? Talk to your health care provider about getting a sleep evaluation if you snore a lot or have excessive sleepiness.  Take medicines only as directed by your health care provider. ? For some people, aspirin or blood thinners (anticoagulants) are helpful in reducing the risk of forming abnormal blood clots that can lead to stroke. If you have the irregular heart rhythm of atrial fibrillation, you should be on a blood thinner unless there is a good reason you cannot take them. ? Understand all your medicine instructions.  Make sure that other conditions (such as anemia or atherosclerosis) are addressed. Get help right away if:  You have sudden weakness or numbness of the face, arm, or leg, especially on one side of the body.  Your face or eyelid droops to one side.  You have sudden confusion.  You have trouble speaking (aphasia) or understanding.  You have sudden trouble seeing in one or both eyes.  You have sudden trouble walking.  You have dizziness.  You have a loss of balance or coordination.  You have a sudden, severe headache with no known cause.  You have new chest pain or an irregular heartbeat. Any of these symptoms may represent a serious problem that is an emergency. Do not wait to see if the symptoms will go away.   Get medical help at once. Call your local emergency services (911 in [place] Do not drive yourself to the hospital. This information is not intended to replace advice given to you by your health care provider. Make sure you discuss any questions you have with your health care provider. Document Released: 06/09/2004 Document Revised: 10/08/2015 Document Reviewed: 11/02/2012 Elsevier  Interactive Patient Education  2017 Elsevier Inc.     Preventing Cerebrovascular Disease Arteries are blood vessels that carry blood that contains oxygen from the heart to all parts of the body. Cerebrovascular disease affects arteries that supply the brain. Any condition that blocks or disrupts blood flow to the brain can cause cerebrovascular disease. Brain cells that lose blood supply start to die within minutes (stroke). Stroke is the main danger of cerebrovascular disease. Atherosclerosis and high blood pressure are common causes of cerebrovascular disease. Atherosclerosis is narrowing and hardening of an artery that results when fat, cholesterol, calcium, or other substances (plaque) build up inside an artery. Plaque reduces blood flow through the artery. High blood pressure increases the risk of bleeding inside the brain. Making diet and lifestyle changes to prevent atherosclerosis and high blood pressure lowers your risk of cerebrovascular disease. What nutrition changes can be made?  Eat more fruits, vegetables, and whole grains.  Reduce how much saturated fat you eat. To do this, eat less red meat and fewer full-fat dairy products.  Eat healthy proteins instead of red meat. Healthy proteins include: ? Fish. Eat fish that contains heart-healthy omega-3 fatty acids, twice a week. Examples include salmon, albacore tuna, mackerel, and herring. ? Chicken. ? Nuts. ? Low-fat or nonfat yogurt.  Avoid processed meats, like bacon and lunchmeat.  Avoid foods that contain: ? A lot of sugar, such as sweets and drinks with added sugar. ? A lot of salt (sodium). Avoid adding extra salt to your food, as told by your health care provider. ? Trans fats, such as margarine and baked goods. Trans fats may be listed as "partially hydrogenated oils" on food labels.  Check food labels to see how much sodium, sugar, and trans fats are in foods.  Use vegetable oils that contain low amounts of  saturated fat, such as olive oil or canola oil. What lifestyle changes can be made?  Drink alcohol in moderation. This means no more than 1 drink a day for nonpregnant women and 2 drinks a day for men. One drink equals 12 oz of beer, 5 oz of wine, or 1 oz of hard liquor.  If you are overweight, ask your health care provider to recommend a weight-loss plan for you. Losing 5-10 lb (2.2-4.5 kg) can reduce your risk of diabetes, atherosclerosis, and high blood pressure.  Exercise for 30?60 minutes on most days, or as much as told by your health care provider. ? Do moderate-intensity exercise, such as brisk walking, bicycling, and water aerobics. Ask your health care provider which activities are safe for you.  Do not use any products that contain nicotine or tobacco, such as cigarettes and e-cigarettes. If you need help quitting, ask your health care provider. Why are these changes important? Making these changes lowers your risk of many diseases that can cause cerebrovascular disease and stroke. Stroke is a leading cause of death and disability. Making these changes also improves your overall health and quality of life. What can I do to lower my risk? The following factors make you more likely to develop cerebrovascular disease:  Being overweight.  Smoking.  Being physically inactive.    Eating a high-fat diet.  Having certain health conditions, such as: ? Diabetes. ? High blood pressure. ? Heart disease. ? Atherosclerosis. ? High cholesterol. ? Sickle cell disease.  Talk with your health care provider about your risk for cerebrovascular disease. Work with your health care provider to control diseases that you have that may contribute to cerebrovascular disease. Your health care provider may prescribe medicines to help prevent major causes of cerebrovascular disease. Where to find more information: Learn more about preventing cerebrovascular disease from:  National Heart, Lung, and  Blood Institute: www.nhlbi.nih.gov/health/health-topics/topics/stroke  Centers for Disease Control and Prevention: cdc.gov/stroke/about.htm  Summary  Cerebrovascular disease can lead to a stroke.  Atherosclerosis and high blood pressure are major causes of cerebrovascular disease.  Making diet and lifestyle changes can reduce your risk of cerebrovascular disease.  Work with your health care provider to get your risk factors under control to reduce your risk of cerebrovascular disease. This information is not intended to replace advice given to you by your health care provider. Make sure you discuss any questions you have with your health care provider. Document Released: 05/17/2015 Document Revised: 11/20/2015 Document Reviewed: 05/17/2015 Elsevier Interactive Patient Education  2018 Elsevier Inc.  

## 2016-12-20 ENCOUNTER — Encounter: Payer: Self-pay | Admitting: "Endocrinology

## 2016-12-20 ENCOUNTER — Ambulatory Visit (INDEPENDENT_AMBULATORY_CARE_PROVIDER_SITE_OTHER): Payer: Medicare Other | Admitting: "Endocrinology

## 2016-12-20 VITALS — BP 129/76 | HR 80 | Wt 92.0 lb

## 2016-12-20 DIAGNOSIS — K8681 Exocrine pancreatic insufficiency: Secondary | ICD-10-CM

## 2016-12-20 DIAGNOSIS — E039 Hypothyroidism, unspecified: Secondary | ICD-10-CM | POA: Diagnosis not present

## 2016-12-20 DIAGNOSIS — E118 Type 2 diabetes mellitus with unspecified complications: Secondary | ICD-10-CM | POA: Diagnosis not present

## 2016-12-20 DIAGNOSIS — I10 Essential (primary) hypertension: Secondary | ICD-10-CM

## 2016-12-20 MED ORDER — LEVOTHYROXINE SODIUM 75 MCG PO TABS: 75.0000 ug | ORAL_TABLET | Freq: Every day | ORAL | 6 refills | Status: AC

## 2016-12-20 MED ORDER — PANCRELIPASE (LIP-PROT-AMYL) 24000-76000 UNITS PO CPEP: ORAL_CAPSULE | ORAL | 3 refills | Status: DC

## 2016-12-20 NOTE — Progress Notes (Signed)
Subjective:    Patient ID: [name], female    DOB: [birth year],    Past Medical History:  Diagnosis Date  . Anemia   . Arthritis    "knees" (10/16/2014)  . Carotid artery stenosis   . Chronic kidney disease    h/o transplant  . Family history of adverse reaction to anesthesia    "my daughter; they thought she was asleep and she wasn't"  . Frequent UTI   . GERD (gastroesophageal reflux disease)   . History of blood transfusion "several"   "all related to anemia"  . Hyperlipemia   . Hypertension   . Hypothyroidism   . Pneumonia "1-2 times"  . Shingles   . Type II diabetes mellitus ([place])    Past Surgical History:  Procedure Laterality Date  . ABDOMINAL HYSTERECTOMY    . APPENDECTOMY    . BACK SURGERY    . CHOLECYSTECTOMY OPEN    . KIDNEY SURGERY     multiple  . KIDNEY TRANSPLANT Right 2004   at [facility]  . LUMBAR DISC SURGERY     "L5"  . THYROIDECTOMY     1/2 removed  . TONSILLECTOMY    . TUBAL LIGATION     Social History   Social History  . Marital status: Widowed    Spouse name: N/A  . Number of children: 5  . Years of education: N/A   Occupational History  . Retired    Social History Main Topics  . Smoking status: Never Smoker  . Smokeless tobacco: Never Used  . Alcohol use No  . Drug use: No  . Sexual activity: No   Other Topics Concern  . None   Social History Narrative  . None   Outpatient Encounter Prescriptions as of 12/20/2016  Medication Sig  . aspirin 81 MG tablet Take 81 mg by mouth at bedtime.   . Blood Glucose Monitoring Suppl (ACCU-CHEK AVIVA PLUS) w/Device KIT Use as directed 2 x daily  . clonazePAM (KLONOPIN) 1 MG tablet Take 1 mg by mouth at bedtime.  . dicyclomine (BENTYL) 10 MG capsule Take 10 mg by mouth 3 (three) times daily before meals.   . diphenoxylate-atropine (LOMOTIL) 2.5-0.025 MG tablet Take 1 tablet by mouth 4 (four) times daily as needed for diarrhea or loose stools.  Marland Kitchen glucose blood (ACCU-CHEK AVIVA) test  strip Use as instructed bid  . hyoscyamine (LEVSIN, ANASPAZ) 0.125 MG tablet Take 0.125 mg by mouth every 4 (four) hours as needed for cramping.   Marland Kitchen levothyroxine (SYNTHROID, LEVOTHROID) 75 MCG tablet Take 1 tablet (75 mcg total) by mouth daily before breakfast.  . magnesium oxide (MAG-OX) 400 MG tablet Take 400 mg by mouth daily.  . metoprolol (LOPRESSOR) 50 MG tablet Take 50 mg by mouth 2 (two) times daily.  . mycophenolate (CELLCEPT) 500 MG tablet Take 250 mg by mouth 2 (two) times daily.   . Pancrelipase, Lip-Prot-Amyl, (CREON) 24000-76000 units CPEP 2  Capsule With Meals and  1 capsule snacks.  . pantoprazole (PROTONIX) 40 MG tablet Take 40 mg by mouth 2 (two) times daily.   . pregabalin (LYRICA) 50 MG capsule Take 50 mg by mouth 1 day or 1 dose.  . sodium bicarbonate 650 MG tablet Take 1 tablet (650 mg total) by mouth 2 (two) times daily. (Patient taking differently: Take 1,300 mg by mouth daily. )  . tacrolimus (PROGRAF) 0.5 MG capsule Take 1 mg by mouth daily.   . [DISCONTINUED] levothyroxine (SYNTHROID, LEVOTHROID) 75  MCG tablet Take 75 mcg by mouth daily before breakfast.  . [DISCONTINUED] Pancrelipase, Lip-Prot-Amyl, (CREON) 24000-76000 units CPEP 2  Capsule With Meals and  1 capsule snacks.  . [DISCONTINUED] acyclovir (ZOVIRAX) 400 MG tablet Take 2 tablets (800 mg total) by mouth 5 (five) times daily. (Patient not taking: Reported on 12/19/2016)  . [DISCONTINUED] HYDROcodone-acetaminophen (NORCO/VICODIN) 5-325 MG tablet Take 1-2 tablets by mouth every 4 (four) hours as needed. (Patient not taking: Reported on 12/19/2016)  . [DISCONTINUED] ondansetron (ZOFRAN ODT) 4 MG disintegrating tablet Take 1 tablet (4 mg total) by mouth every 8 (eight) hours as needed for nausea or vomiting.  . [DISCONTINUED] predniSONE (DELTASONE) 10 MG tablet Take 1 tablet (10 mg total) by mouth daily with breakfast. (Patient not taking: Reported on 12/19/2016)   No facility-administered encounter medications on file  as of 12/20/2016.    ALLERGIES: Allergies  Allergen Reactions  . Codeine Nausea And Vomiting  . Elavil [Amitriptyline] Other (See Comments)    unknown  . Iodinated Diagnostic Agents      Renal transplant pt  . Lipitor [Atorvastatin]   . Magnesium Hives    Pt tolerated oral and IV during 06/2014 admission   . Metoclopramide     Other reaction(s): Other (See Comments) "doesn't work"  . Morphine     Other reaction(s): Confusion (intolerance)  . Morphine And Related   . Neurontin [Gabapentin]   . Niacin And Related   . Norvasc [Amlodipine Besylate]   . Talwin [Pentazocine]   . Ultram [Tramadol]    VACCINATION STATUS:  There is no immunization history on file for this patient.  Diabetes  She presents for her follow-up diabetic visit. She has type 2 diabetes mellitus. 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  Her disease course has been stable. There are no hypoglycemic associated symptoms. Pertinent negatives for hypoglycemia include no confusion, headaches, pallor or seizures. Associated symptoms include fatigue. Pertinent negatives for diabetes include no chest pain, no polydipsia, no polyphagia and no polyuria. There are no hypoglycemic complications. Symptoms are stable. Diabetic complications include nephropathy. Her weight is stable. She never participates in exercise. There is no change in her home blood glucose trend. Her breakfast blood glucose range is generally 90-110 mg/dl. Her overall blood glucose range is 90-110 mg/dl. An ACE inhibitor/angiotensin II receptor blocker is not being taken.  Hypertension  Pertinent negatives include no chest pain, headaches, palpitations or shortness of breath. Identifiable causes of hypertension include a thyroid problem.  Hyperlipidemia  Pertinent negatives include no chest pain, myalgias or shortness of breath.  Thyroid Problem  Presents for  follow-up visit. Symptoms include fatigue. Patient reports no cold intolerance, diarrhea, heat intolerance or palpitations. The symptoms have been stable. Past treatments include levothyroxine. Her past medical history is significant for hyperlipidemia.     Review of Systems  Constitutional: Positive for fatigue. Negative for unexpected weight change.  HENT: Negative for trouble swallowing and voice change.   Eyes: Negative for visual disturbance.  Respiratory: Negative for cough, shortness of breath and wheezing.   Cardiovascular: Negative for chest pain, palpitations and leg swelling.  Gastrointestinal: Negative for diarrhea, nausea and vomiting.  Endocrine: Negative for cold intolerance, heat intolerance, polydipsia, polyphagia and polyuria.  Musculoskeletal: Negative for arthralgias and myalgias.  Skin: Negative for color change, pallor, rash and wound.  Neurological: Negative for seizures and headaches.  Psychiatric/Behavioral: Negative for confusion and suicidal ideas.  Objective:    BP 129/76   Pulse 80   Wt 92 lb (41.7 kg)   SpO2 100%   BMI 16.30 kg/m   Wt Readings from Last 3 Encounters:  12/20/16 92 lb (41.7 kg)  12/19/16 91 lb (41.3 kg)  10/16/16 88 lb (39.9 kg)    Physical Exam  Constitutional: She is oriented to person, place, and time. She appears well-developed.  HENT:  Head: Normocephalic and atraumatic.  Eyes: EOM are normal.  Neck: Normal range of motion. Neck supple. No tracheal deviation present. No thyromegaly present.  Cardiovascular: Normal rate and regular rhythm.   Pulmonary/Chest: Effort normal and breath sounds normal.  Abdominal: Soft. Bowel sounds are normal. There is no tenderness. There is no guarding.  Musculoskeletal: Normal range of motion. She exhibits no edema.  Neurological: She is alert and oriented to person, place, and time. She has normal reflexes. No cranial nerve deficit. Coordination normal.  Skin: Skin is warm and dry. No rash  noted. No erythema. No pallor.  Psychiatric: She has a normal mood and affect. Judgment normal.    Results for orders placed or performed during the hospital encounter of 12/19/16  VAS US CAROTID  Result Value Ref Range   Right CCA prox sys 98 cm/s   Right CCA prox dias 22 cm/s   Right cca dist sys -111 cm/s   Left CCA prox sys 88 cm/s   Left CCA prox dias 22 cm/s   Left CCA dist sys 80 cm/s   Left CCA dist dias 27 cm/s   Left ICA prox sys -142 cm/s   Left ICA prox dias -50 cm/s   Left ICA dist sys -119 cm/s   Left ICA dist dias -47 cm/s   RIGHT CCA MID DIAS 16.00 cm/s   RIGHT ECA DIAS -11.00 cm/s   LEFT ECA DIAS -15.00 cm/s   Diabetic Labs (most recent): Lab Results  Component Value Date   HGBA1C 4.9 12/14/2016   HGBA1C 4.3 (L) 06/14/2016   HGBA1C 5.0 09/21/2015   Lipid profile (most recent): Lab Results  Component Value Date   TRIG 208 (H) 10/17/2014   CHOL 138 10/17/2014         Assessment & Plan:   1. 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  Her   diabetes is  complicated by CKD following with Nephrology. Patient came with stable glucose profile, and  recent A1c of 4.9 %.  .  Recent labs reviewed. - Patient remains at a high risk for more acute and chronic complications of diabetes which include CAD, CVA, CKD, retinopathy, and neuropathy. These are all discussed in detail with the patient.  - She has significant weight deficit mainly due to pancreatic insufficiency and malabsorption, I have re-counseled the patient on The need for her to take Creon with meals and snacks. See below.  - I have approached patient with the following individualized plan to manage diabetes and patient agrees.  - She will not need any therapy for diabetes at this time. - She will  monitor  blood glucose PRN. -Patient is encouraged to call clinic for blood glucose levels less than 70 or above 200 mg /dl. -Patient is not a candidate for metformin norSGLT2 inhibitors due to CKD. -  She will continue to require Creon- 2 capsules  per meal and one capsule with snacks. -I encouraged her to maintain her follow-up with GI in [facility]. - she is not a candidate for incretin therapy . - Patient specific target  for A1c; LDL, HDL, Triglycerides, and  Waist Circumference were discussed in detail.  2) BP/HTN: Controlled.   3)  Weight/Diet: She is encouraged to consume more carbs, protein and fruits and vegetables.   4) Chronic Care/Health Maintenance:  -Patient is  encouraged to continue to follow up with Ophthalmology, Podiatrist at least yearly or according to recommendations, and advised to stay away from smoking. I have recommended yearly flu vaccine and pneumonia vaccination at least every 5 years; and  sleep for at least 7 hours a day.  I advised patient to maintain close follow up with their PCP for primary care needs and her nephrologist for CKD.   Patient is asked to bring meter and  blood glucose logs during their next visit.   5) Hypothyroidism  Continue her levothyroxine at 75 g by mouth every morning. Decision to adjust her levothyroxine would be based on T4 or free T4 in stead of TSH.  - We discussed about correct intake of levothyroxine, at fasting, with water, separated by at least 30 minutes from breakfast, and separated by more than 4 hours from calcium, iron, multivitamins, acid reflux medications (PPIs). -Patient is made aware of the fact that thyroid hormone replacement is needed for life, dose to be adjusted by periodic monitoring of thyroid function tests.  Follow up plan: Return in about 6 months (around 06/22/2017) for follow up with pre-visit labs.  Glade Lloyd, MD Phone: [phone]  Fax: [phone]   12/20/2016, 4:40 PM

## 2016-12-21 ENCOUNTER — Encounter (HOSPITAL_COMMUNITY)
Admission: RE | Admit: 2016-12-21 | Discharge: 2016-12-21 | Disposition: A | Discharge status: Home / Self Care | Payer: Medicare Other | Source: Ambulatory Visit | Attending: Nephrology | Admitting: Nephrology

## 2016-12-21 DIAGNOSIS — D631 Anemia in chronic kidney disease: Secondary | ICD-10-CM | POA: Diagnosis present

## 2016-12-21 DIAGNOSIS — N183 Chronic kidney disease, stage 3 (moderate): Secondary | ICD-10-CM | POA: Diagnosis not present

## 2016-12-21 LAB — IRON AND TIBC
IRON: 79 ug/dL (ref 28–170)
Saturation Ratios: 33 % — ABNORMAL HIGH (ref 10.4–31.8)
TIBC: 242 ug/dL — AB (ref 250–450)
UIBC: 163 ug/dL

## 2016-12-21 LAB — POCT HEMOGLOBIN-HEMACUE: Hemoglobin: 8.4 g/dL — ABNORMAL LOW (ref 12.0–15.0)

## 2016-12-21 LAB — FERRITIN: FERRITIN: 1219 ng/mL — AB (ref 11–307)

## 2016-12-21 MED ORDER — DARBEPOETIN ALFA 100 MCG/0.5ML IJ SOSY
PREFILLED_SYRINGE | INTRAMUSCULAR | Status: AC
  Filled 2016-12-21: qty 0.5

## 2016-12-21 MED ORDER — DARBEPOETIN ALFA 100 MCG/0.5ML IJ SOSY
100.0000 ug | PREFILLED_SYRINGE | INTRAMUSCULAR | Status: DC
  Administered 2016-12-21: 100 ug via SUBCUTANEOUS

## 2017-01-04 ENCOUNTER — Encounter (HOSPITAL_COMMUNITY)
Admission: RE | Admit: 2017-01-04 | Discharge: 2017-01-04 | Disposition: A | Discharge status: Home / Self Care | Payer: Medicare Other | Source: Ambulatory Visit | Attending: Nephrology | Admitting: Nephrology

## 2017-01-04 DIAGNOSIS — N183 Chronic kidney disease, stage 3 (moderate): Secondary | ICD-10-CM | POA: Diagnosis not present

## 2017-01-04 LAB — POCT HEMOGLOBIN-HEMACUE: HEMOGLOBIN: 7.6 g/dL — AB (ref 12.0–15.0)

## 2017-01-04 MED ORDER — DARBEPOETIN ALFA 100 MCG/0.5ML IJ SOSY
100.0000 ug | PREFILLED_SYRINGE | Freq: Once | INTRAMUSCULAR | Status: AC
  Administered 2017-01-04: 100 ug via SUBCUTANEOUS
  Filled 2017-01-04: qty 0.5

## 2017-01-05 NOTE — Progress Notes (Signed)
Results for [name], [name] (MRN [id number]) as of 01/05/2017 11:01  Ref. Range 01/04/2017 11:00  Hemoglobin Latest Ref Range: 12.0 - 15.0 g/dL 7.6 (L)   Aranesp 539 mcg SQ given per MD order parameters.

## 2017-01-05 NOTE — Progress Notes (Signed)
Spoke with [facility] @ Drs. Mattingley and Coladonato's nurse regarding pt's decreased hgb. Will make MD aware and call back with any further orders. Ms. Tallo called to update. Educated pt of signs and symptoms of anemia and encouraged to go to the ED if she had any shortness of breath or increased dizziness.

## 2017-01-09 ENCOUNTER — Ambulatory Visit (INDEPENDENT_AMBULATORY_CARE_PROVIDER_SITE_OTHER): Payer: Medicare Other | Admitting: Cardiology

## 2017-01-09 ENCOUNTER — Encounter: Payer: Self-pay | Admitting: Cardiology

## 2017-01-09 VITALS — BP 134/70 | HR 82 | Ht 63.0 in | Wt 91.0 lb

## 2017-01-09 DIAGNOSIS — R9431 Abnormal electrocardiogram [ECG] [EKG]: Secondary | ICD-10-CM | POA: Diagnosis not present

## 2017-01-09 DIAGNOSIS — Z940 Kidney transplant status: Secondary | ICD-10-CM | POA: Diagnosis not present

## 2017-01-09 DIAGNOSIS — D509 Iron deficiency anemia, unspecified: Secondary | ICD-10-CM

## 2017-01-09 DIAGNOSIS — R011 Cardiac murmur, unspecified: Secondary | ICD-10-CM

## 2017-01-09 DIAGNOSIS — Z01810 Encounter for preprocedural cardiovascular examination: Secondary | ICD-10-CM | POA: Diagnosis not present

## 2017-01-09 DIAGNOSIS — I10 Essential (primary) hypertension: Secondary | ICD-10-CM

## 2017-01-09 DIAGNOSIS — Z789 Other specified health status: Secondary | ICD-10-CM | POA: Diagnosis not present

## 2017-01-09 DIAGNOSIS — I739 Peripheral vascular disease, unspecified: Secondary | ICD-10-CM

## 2017-01-09 DIAGNOSIS — R0602 Shortness of breath: Secondary | ICD-10-CM | POA: Diagnosis not present

## 2017-01-09 DIAGNOSIS — I779 Disorder of arteries and arterioles, unspecified: Secondary | ICD-10-CM | POA: Diagnosis not present

## 2017-01-09 DIAGNOSIS — Z8489 Family history of other specified conditions: Secondary | ICD-10-CM | POA: Insufficient documentation

## 2017-01-09 NOTE — Patient Instructions (Signed)
Medication Instructions:  Your physician recommends that you continue on your current medications as directed. Please refer to the Current Medication list given to you today.   Labwork: NONE  Testing/Procedures: Your physician has requested that you have a lexiscan myoview. For further information please visit [email or web address] Please follow instruction sheet, as given.  Your physician has requested that you have an echocardiogram. Echocardiography is a painless test that uses sound waves to create images of your heart. It provides your doctor with information about the size and shape of your heart and how well your heart's chambers and valves are working. This procedure takes approximately one hour. There are no restrictions for this procedure.    Follow-Up: We will call you with test results   Any Other Special Instructions Will Be Listed Below (If Applicable).     If you need a refill on your cardiac medications before your next appointment, please call your pharmacy.

## 2017-01-09 NOTE — Progress Notes (Signed)
Cardiology Office Note  Date: 01/09/2017   ID: Melody, Cirrincione 04-22-1941, MRN [id number]  PCP: [name], DO  Consulting Cardiologist: Rozann Lesches, MD   Chief Complaint  Patient presents with  . Preoperative evaluation    History of Present Illness: [name] is a 76 y.o. female referred for cardiology consultation by Dr. Trula Slade for preoperative cardiac assessment prior to carotid endarterectomy. She presents describing no exertional chest pain but does have fairly chronic fatigue and mild dyspnea on exertion. She attributes this to her anemia. She has a history of iron deficiency anemia requiring packed red cell transfusions and also Aranesp. Recent hemoglobin was 7.6 as noted below.  She lives in her own apartment, her husband passed away about 6 years ago. She takes care of all of her ADLs including cooking and cleaning. No regular exercise regimen.  Records indicate cardiology consultation with Dr. Harl Bowie back in 2016 the setting of chest pain. She had multifocal atrial tachycardia at that time with demand ischemia and mild elevation in troponin I, noted in the setting of symptomatic anemia and cellulitis. She was managed medically, echocardiogram from 2016 demonstrating normal LVEF. She has not undergone ischemic testing since 2010.  I reviewed her most recent ECG which was overall nonspecific.  I reviewed her current medications include aspirin and Lopressor. She has a history of statin intolerance.   Past Medical History:  Diagnosis Date  . Arthritis   . Carotid artery stenosis   . Essential hypertension   . Frequent UTI   . GERD (gastroesophageal reflux disease)   . History of blood transfusion   . History of pneumonia   . History of renal transplantation    Follows with Dr. Mercy Moore  . Hyperlipemia   . Hypothyroidism   . Iron deficiency anemia   . 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 Shingles   . Type II diabetes  mellitus ([place])     Past Surgical History:  Procedure Laterality Date  . ABDOMINAL HYSTERECTOMY    . APPENDECTOMY    . BACK SURGERY    . CHOLECYSTECTOMY OPEN    . KIDNEY SURGERY     multiple  . KIDNEY TRANSPLANT Right 2004   at [facility]  . LUMBAR DISC SURGERY     "L5"  . THYROIDECTOMY     1/2 removed  . TONSILLECTOMY    . TUBAL LIGATION      Current Outpatient Prescriptions  Medication Sig Dispense Refill  . aspirin 81 MG tablet Take 81 mg by mouth at bedtime.     . Blood Glucose Monitoring Suppl (ACCU-CHEK AVIVA PLUS) w/Device KIT Use as directed 2 x daily 1 kit 0  . clonazePAM (KLONOPIN) 1 MG tablet Take 1 mg by mouth at bedtime.    . dicyclomine (BENTYL) 10 MG capsule Take 10 mg by mouth 3 (three) times daily before meals.     . diphenoxylate-atropine (LOMOTIL) 2.5-0.025 MG tablet Take 1 tablet by mouth 4 (four) times daily as needed for diarrhea or loose stools.    Marland Kitchen glucose blood (ACCU-CHEK AVIVA) test strip Use as instructed bid 100 each 5  . levothyroxine (SYNTHROID, LEVOTHROID) 75 MCG tablet Take 1 tablet (75 mcg total) by mouth daily before breakfast. 30 tablet 6  . magnesium oxide (MAG-OX) 400 MG tablet Take 400 mg by mouth daily.    . metoprolol (LOPRESSOR) 50 MG tablet Take 50 mg by mouth 2 (two) times daily.    Marland Kitchen  mycophenolate (CELLCEPT) 500 MG tablet Take 250 mg by mouth 2 (two) times daily.     . pantoprazole (PROTONIX) 40 MG tablet Take 40 mg by mouth 2 (two) times daily.   5  . sodium bicarbonate 650 MG tablet Take 1 tablet (650 mg total) by mouth 2 (two) times daily. (Patient taking differently: Take 1,300 mg by mouth daily. ) 60 tablet 0  . tacrolimus (PROGRAF) 0.5 MG capsule Take 1 mg by mouth daily.      No current facility-administered medications for this visit.    Allergies:  Codeine; Elavil [amitriptyline]; Iodinated diagnostic agents; Lipitor [atorvastatin]; Magnesium; Metoclopramide; Morphine; Morphine and related; Neurontin [gabapentin]; Niacin and  related; Norvasc [amlodipine besylate]; Talwin [pentazocine]; and Ultram [tramadol]   Social History: The patient  reports that she has never smoked. She has never used smokeless tobacco. She reports that she does not drink alcohol or use drugs.   Family History: The patient's family history includes Cancer in her daughter; Diabetes in her mother; Heart disease in her father; Hyperlipidemia in her daughter, mother, sister, and son; Hypertension in her daughter, mother, sister, and son; Melanoma in her mother; Varicose Veins in her father.   ROS:  Please see the history of present illness. Otherwise, complete review of systems is positive for recent mild leg swelling after taking Lyrica. She also had a recent bout of shingles. She has also been underweight.  All other systems are reviewed and negative.   Physical Exam: VS:  BP 134/70 (BP Location: Left Arm)   Pulse 82   Ht 5' 3"  (1.6 m)   Wt 91 lb (41.3 kg)   SpO2 97%   BMI 16.12 kg/m , BMI Body mass index is 16.12 kg/m.  Wt Readings from Last 3 Encounters:  01/09/17 91 lb (41.3 kg)  01/04/17 92 lb (41.7 kg)  12/20/16 92 lb (41.7 kg)    General: Very thin elderly woman, appears comfortable at rest. HEENT: Conjunctiva and lids normal, oropharynx clear. Neck: Supple, no elevated JVP, bilateral carotid bruits right greater than left, no thyromegaly. Lungs: Clear to auscultation, nonlabored breathing at rest. Cardiac: Regular rate and rhythm, no S3, 2/6 basal systolic murmur, no pericardial rub. Abdomen: Soft, nontender, bowel sounds present, no guarding or rebound. Extremities: Mild bilateral pedal edema, distal pulses 2+. Skin: Warm and dry. Musculoskeletal: No kyphosis. Neuropsychiatric: Alert and oriented x3, affect grossly appropriate.  ECG: I personally reviewed the tracing from 10/16/2016 which showed sinus rhythm with rightward axis.  Recent Labwork: 06/14/2016: BUN 26; Creatinine, Ser 1.78; Potassium 4.3; Sodium 143 12/14/2016:  TSH 1.700 01/04/2017: Hemoglobin 7.6     Component Value Date/Time   CHOL 138 10/17/2014 0526   TRIG 208 (H) 10/17/2014 0526   HDL 24 (L) 10/17/2014 0526   CHOLHDL 5.8 10/17/2014 0526   VLDL 42 (H) 10/17/2014 0526   LDLCALC 72 10/17/2014 0526    Other Studies Reviewed Today:  Carotid Dopplers 09/21/1636: 46-65% LICA stenosis and 99-35% RICA stenosis  Echocardiogram 10/20/2014: Study Conclusions  - Left ventricle: The cavity size was normal. Wall thickness was   normal. Systolic function was normal. The estimated ejection   fraction was in the range of 60% to 65%. Wall motion was normal;   there were no regional wall motion abnormalities. Doppler   parameters are consistent with abnormal left ventricular   relaxation (grade 1 diastolic dysfunction). The E/e&' ratio is   >15, suggesting elevated LV filling pressure. - Mitral valve: Mildly thickened leaflets . There was trivial  regurgitation. - Left atrium: The atrium was at the upper limits of normal in   size.  Impressions:  - LVEF 60-65%, normal wall thickness, normal wall motion, diastolic   dysfunction, elevated LV filling pressure, upper normal LA size,   mild MV leaflet thickening with trivial MR.  Exercise Cardiolite 10/03/2008: IMPRESSION: Negative stress nuclear myocardial study revealing impaired exercise capacity, significant EKG abnormalities at low level exercise in the absence of angina that persisted for more than 5 minutes into recovery, normal left ventricular size and normal left ventricular systolic function.  By scintigraphic imaging, myocardial perfusion was normal suggesting that the EKG findings represent a false positive result.  Other findings as noted.  Assessment and Plan:  1. Preoperative cardiac evaluation in a 76 year old woman with vascular disease pending elective right carotid endarterectomy due to 80-99% stenosis by recent carotid Dopplers. Additional history includes hypertension,  hyperlipidemia with statin intolerance, type 2 diabetes mellitus, and previous episode of multifocal atrial tachycardia back in 2016 associated with demand ischemia. She has not undergone ischemic testing since 2010. She has also had some mild pedal edema and a soft systolic murmur on examination. Plan is to obtain an echocardiogram for repeat cardiac structural assessment and also a Lexiscan Myoview to assess ischemic burden, mainly to exclude any high risk features.  2. History of renal transplantation, followed most recently by Dr. Mercy Moore. She does have baseline renal insufficiency with recent creatinine 1.78. She is on immunosuppressive regimen including CellCept and Prograf.  3. Iron deficiency anemia, recent hemoglobin 7.6. She has undergone previous pack red cell transfusions and also treatment with Aranesp. This is been managed by Dr. Mercy Moore. I am uncertain as to whether she has had a hematology evaluation, but she does state that she underwent previous extensive GI evaluation with no bleeding source.  4. History of statin intolerance.  5. Essential hypertension, currently on Lopressor. Blood pressure control is adequate today.  Current medicines were reviewed with the patient today.   Orders Placed This Encounter  Procedures  . NM Myocar Multi W/Spect W/Wall Motion / EF  . ECHOCARDIOGRAM COMPLETE    Disposition: Call with test results and further disposition.  Signed, Satira Sark, MD, [facility] 01/09/2017 2:15 PM    [facility] at [facility] [address] [place], [place] [address] Phone: [phone]; Fax: [phone]

## 2017-01-10 ENCOUNTER — Ambulatory Visit: Payer: Medicare Other | Admitting: Cardiology

## 2017-01-17 ENCOUNTER — Other Ambulatory Visit: Payer: Self-pay

## 2017-01-17 ENCOUNTER — Ambulatory Visit (HOSPITAL_COMMUNITY)
Admission: RE | Admit: 2017-01-17 | Discharge: 2017-01-17 | Disposition: A | Discharge status: Home / Self Care | Payer: Medicare Other | Source: Ambulatory Visit | Attending: Cardiology | Admitting: Cardiology

## 2017-01-17 ENCOUNTER — Encounter (HOSPITAL_BASED_OUTPATIENT_CLINIC_OR_DEPARTMENT_OTHER)
Admission: RE | Admit: 2017-01-17 | Discharge: 2017-01-17 | Disposition: A | Discharge status: Home / Self Care | Payer: Medicare Other | Source: Ambulatory Visit | Attending: Cardiology | Admitting: Cardiology

## 2017-01-17 ENCOUNTER — Encounter (HOSPITAL_COMMUNITY)
Admission: RE | Admit: 2017-01-17 | Discharge: 2017-01-17 | Disposition: A | Discharge status: Home / Self Care | Payer: Medicare Other | Source: Ambulatory Visit | Attending: Cardiology | Admitting: Cardiology

## 2017-01-17 ENCOUNTER — Encounter (HOSPITAL_COMMUNITY): Payer: Self-pay

## 2017-01-17 DIAGNOSIS — R0602 Shortness of breath: Secondary | ICD-10-CM | POA: Diagnosis not present

## 2017-01-17 DIAGNOSIS — Z01810 Encounter for preprocedural cardiovascular examination: Secondary | ICD-10-CM

## 2017-01-17 DIAGNOSIS — I371 Nonrheumatic pulmonary valve insufficiency: Secondary | ICD-10-CM | POA: Insufficient documentation

## 2017-01-17 DIAGNOSIS — I4891 Unspecified atrial fibrillation: Secondary | ICD-10-CM | POA: Insufficient documentation

## 2017-01-17 DIAGNOSIS — I10 Essential (primary) hypertension: Secondary | ICD-10-CM | POA: Insufficient documentation

## 2017-01-17 DIAGNOSIS — E785 Hyperlipidemia, unspecified: Secondary | ICD-10-CM | POA: Insufficient documentation

## 2017-01-17 DIAGNOSIS — E119 Type 2 diabetes mellitus without complications: Secondary | ICD-10-CM | POA: Diagnosis not present

## 2017-01-17 DIAGNOSIS — R7881 Bacteremia: Secondary | ICD-10-CM | POA: Diagnosis not present

## 2017-01-17 LAB — NM MYOCAR MULTI W/SPECT W/WALL MOTION / EF
CHL CUP NUCLEAR SDS: 0
CHL CUP NUCLEAR SRS: 0
CHL CUP RESTING HR STRESS: 69 {beats}/min
LV dias vol: 44 mL (ref 46–106)
LV sys vol: 10 mL
Peak HR: 98 {beats}/min
RATE: 0.5
SSS: 0
TID: 1.03

## 2017-01-17 MED ORDER — SODIUM CHLORIDE 0.9% FLUSH
INTRAVENOUS | Status: AC
  Administered 2017-01-17: 10 mL via INTRAVENOUS
  Filled 2017-01-17: qty 10

## 2017-01-17 MED ORDER — TECHNETIUM TC 99M TETROFOSMIN IV KIT
10.0000 | PACK | Freq: Once | INTRAVENOUS | Status: AC | PRN
  Administered 2017-01-17: 10 via INTRAVENOUS

## 2017-01-17 MED ORDER — TECHNETIUM TC 99M TETROFOSMIN IV KIT
30.0000 | PACK | Freq: Once | INTRAVENOUS | Status: AC | PRN
  Administered 2017-01-17: 30 via INTRAVENOUS

## 2017-01-17 MED ORDER — REGADENOSON 0.4 MG/5ML IV SOLN
INTRAVENOUS | Status: AC
  Administered 2017-01-17: 0.4 mg via INTRAVENOUS
  Filled 2017-01-17: qty 5

## 2017-01-17 NOTE — Progress Notes (Signed)
*  PRELIMINARY RESULTS* Echocardiogram 2D Echocardiogram has been performed.  Jeryl Columbialliott, Daeshon Grammatico 01/17/2017, 11:42 AM

## 2017-01-18 ENCOUNTER — Encounter (HOSPITAL_COMMUNITY)
Admission: RE | Admit: 2017-01-18 | Discharge: 2017-01-18 | Disposition: A | Discharge status: Home / Self Care | Payer: Medicare Other | Source: Ambulatory Visit | Attending: Nephrology | Admitting: Nephrology

## 2017-01-18 DIAGNOSIS — Z79899 Other long term (current) drug therapy: Secondary | ICD-10-CM | POA: Insufficient documentation

## 2017-01-18 DIAGNOSIS — N183 Chronic kidney disease, stage 3 (moderate): Secondary | ICD-10-CM | POA: Diagnosis present

## 2017-01-18 DIAGNOSIS — D631 Anemia in chronic kidney disease: Secondary | ICD-10-CM | POA: Diagnosis not present

## 2017-01-18 DIAGNOSIS — Z5181 Encounter for therapeutic drug level monitoring: Secondary | ICD-10-CM | POA: Insufficient documentation

## 2017-01-18 LAB — FERRITIN: FERRITIN: 1017 ng/mL — AB (ref 11–307)

## 2017-01-18 LAB — IRON AND TIBC
Iron: 52 ug/dL (ref 28–170)
Saturation Ratios: 23 % (ref 10.4–31.8)
TIBC: 228 ug/dL — ABNORMAL LOW (ref 250–450)
UIBC: 176 ug/dL

## 2017-01-18 LAB — POCT HEMOGLOBIN-HEMACUE: Hemoglobin: 10.1 g/dL — ABNORMAL LOW (ref 12.0–15.0)

## 2017-01-18 MED ORDER — DARBEPOETIN ALFA 100 MCG/0.5ML IJ SOSY
PREFILLED_SYRINGE | INTRAMUSCULAR | Status: AC
  Filled 2017-01-18: qty 0.5

## 2017-01-18 MED ORDER — DARBEPOETIN ALFA 100 MCG/0.5ML IJ SOSY
100.0000 ug | PREFILLED_SYRINGE | INTRAMUSCULAR | Status: DC
  Administered 2017-01-18: 100 ug via SUBCUTANEOUS

## 2017-01-20 NOTE — Pre-Procedure Instructions (Addendum)
[name] [facility]  01/20/2017      [id number] - [place], [place] - [address] [place] [address] Phone: [phone] Fax: [phone]  [facility] - [place], [place] - [address] [place] [address] Phone: [phone] Fax: [phone]  [facility] - [place], [place] - [address] [place] [address] Phone: [phone] Fax: [phone]  [id number] - , [place] - [address]  [place] [address] Phone: [phone] Fax: [phone]    Your procedure is scheduled on September 12  Report to [facility] Admitting at 1215 P.M.  Call this number if you have problems the morning of surgery:  [phone]   Remember:  Do not eat food or drink liquids after midnight.  Continue all other medications as directed by your physician except follow these medication instructions before surgery   Take these medicines the morning of surgery with A SIP OF WATER  CELLCEPT  dicyclomine (BENTYL)  fluconazole (DIFLUCAN)  levothyroxine (SYNTHROID, LEVOTHROID) metoprolol (LOPRESSOR) pantoprazole (PROTONIX) tacrolimus (PROGRAF)  7 days prior to surgery STOP taking any Aleve, Naproxen, Ibuprofen, Motrin, Advil, Goody's, BC's, all herbal medications, fish oil, and all vitamins  Follow your doctors instructions regarding your Aspirin.  If no instructions were given by the doctor you will need to call the office to get instructions.  Your pre admission RN will also call for those instructions     How to Manage Your Diabetes Before and After Surgery  Why is it important to control my blood sugar before and after surgery? . Improving blood sugar levels before and after surgery helps healing and can limit problems. . A way of improving blood sugar control is eating a healthy diet by: o  Eating less sugar and carbohydrates o  Increasing  activity/exercise o  Talking with your doctor about reaching your blood sugar goals . High blood sugars (greater than 180 mg/dL) can raise your risk of infections and slow your recovery, so you will need to focus on controlling your diabetes during the weeks before surgery. . Make sure that the doctor who takes care of your diabetes knows about your planned surgery including the date and location.  How do I manage my blood sugar before surgery? . Check your blood sugar at least 4 times a day, starting 2 days before surgery, to make sure that the level is not too high or low. o Check your blood sugar the morning of your surgery when you wake up and every 2 hours until you get to the Short Stay unit. . If your blood sugar is less than 70 mg/dL, you will need to treat for low blood sugar: o Do not take insulin. o Treat a low blood sugar (less than 70 mg/dL) with  cup of clear juice (cranberry or apple), 4 glucose tablets, OR glucose gel. o Recheck blood sugar in 15 minutes after treatment (to make sure it is greater than 70 mg/dL). If your blood sugar is not greater than 70 mg/dL on recheck, call [phone] for further instructions. . Report your blood sugar to the short stay nurse when you get to Short Stay.  . If you are admitted to the hospital after surgery: o Your blood sugar will be checked by the staff and you will probably be given insulin after surgery (instead of oral diabetes medicines) to make sure you have good blood sugar levels. o The goal for  blood sugar control after surgery is 80-180 mg/dL.     WHAT DO I DO ABOUT MY DIABETES MEDICATION?   Marland Kitchen. Do not take oral diabetes medicines (pills) the morning of surgery.  . THE NIGHT BEFORE SURGERY, take ___________ units of ___________insulin.       Marland Kitchen. HE MORNING OF SURGERY, take _____________ units of __________insulin.  . The day of surgery, do not take other diabetes injectables, including Byetta (exenatide), Bydureon (exenatide  ER), Victoza (liraglutide), or Trulicity (dulaglutide).  . If your CBG is greater than 220 mg/dL, you may take  of your sliding scale (correction) dose of insulin.  Other Instructions:          Patient Signature:  Date:   Nurse Signature:  Date:   Reviewed and Endorsed by [facility] Patient Education Committee, August 2015    Do not wear jewelry, make-up or nail polish.  Do not wear lotions, powders, or perfumes, or deoderant.  Do not shave 48 hours prior to surgery.    Do not bring valuables to the hospital.  [facility] is not responsible for any belongings or valuables.  Contacts, dentures or bridgework may not be worn into surgery.  Leave your suitcase in the car.  After surgery it may be brought to your room.  For patients admitted to the hospital, discharge time will be determined by your treatment team.  Patients discharged the day of surgery will not be allowed to drive home.    Special instructions:   [facility]- Preparing For Surgery  Before surgery, you can play an important role. Because skin is not sterile, your skin needs to be as free of germs as possible. You can reduce the number of germs on your skin by washing with CHG (chlorahexidine gluconate) Soap before surgery.  CHG is an antiseptic cleaner which kills germs and bonds with the skin to continue killing germs even after washing.  Please do not use if you have an allergy to CHG or antibacterial soaps. If your skin becomes reddened/irritated stop using the CHG.  Do not shave (including legs and underarms) for at least 48 hours prior to first CHG shower. It is OK to shave your face.  Please follow these instructions carefully.   1. Shower the NIGHT BEFORE SURGERY and the MORNING OF SURGERY with CHG.   2. If you chose to wash your hair, wash your hair first as usual with your normal shampoo.  3. After you shampoo, rinse your hair and body thoroughly to remove the shampoo.  4. Use CHG as you would  any other liquid soap. You can apply CHG directly to the skin and wash gently with a scrungie or a clean washcloth.   5. Apply the CHG Soap to your body ONLY FROM THE NECK DOWN.  Do not use on open wounds or open sores. Avoid contact with your eyes, ears, mouth and genitals (private parts). Wash genitals (private parts) with your normal soap.  6. Wash thoroughly, paying special attention to the area where your surgery will be performed.  7. Thoroughly rinse your body with warm water from the neck down.  8. DO NOT shower/wash with your normal soap after using and rinsing off the CHG Soap.  9. Pat yourself dry with a CLEAN TOWEL.   10. Wear CLEAN PAJAMAS   11. Place CLEAN SHEETS on your bed the night of your first shower and DO NOT SLEEP WITH PETS.    Day of Surgery: Do not apply any deodorants/lotions.  Please wear clean clothes to the hospital/surgery center.      Please read over the following fact sheets that you were given.

## 2017-01-23 ENCOUNTER — Encounter (HOSPITAL_COMMUNITY)
Admission: RE | Admit: 2017-01-23 | Discharge: 2017-01-23 | Disposition: A | Discharge status: Home / Self Care | Payer: Medicare Other | Source: Ambulatory Visit | Attending: Surgery | Admitting: Surgery

## 2017-01-23 ENCOUNTER — Encounter (HOSPITAL_COMMUNITY): Payer: Self-pay

## 2017-01-23 ENCOUNTER — Other Ambulatory Visit (HOSPITAL_COMMUNITY): Payer: Self-pay | Admitting: *Deleted

## 2017-01-23 LAB — URINALYSIS, ROUTINE W REFLEX MICROSCOPIC
BACTERIA UA: NONE SEEN
BILIRUBIN URINE: NEGATIVE
Glucose, UA: NEGATIVE mg/dL
Hgb urine dipstick: NEGATIVE
Ketones, ur: NEGATIVE mg/dL
LEUKOCYTES UA: NEGATIVE
Nitrite: NEGATIVE
PH: 6 (ref 5.0–8.0)
PROTEIN: 30 mg/dL — AB
SQUAMOUS EPITHELIAL / LPF: NONE SEEN
Specific Gravity, Urine: 1.009 (ref 1.005–1.030)

## 2017-01-23 LAB — PROTIME-INR
INR: 1.12
PROTHROMBIN TIME: 14.3 s (ref 11.4–15.2)

## 2017-01-23 LAB — COMPREHENSIVE METABOLIC PANEL
ALK PHOS: 138 U/L — AB (ref 38–126)
ALT: 11 U/L — AB (ref 14–54)
AST: 18 U/L (ref 15–41)
Albumin: 4.3 g/dL (ref 3.5–5.0)
Anion gap: 8 (ref 5–15)
BUN: 20 mg/dL (ref 6–20)
CALCIUM: 8.3 mg/dL — AB (ref 8.9–10.3)
CO2: 25 mmol/L (ref 22–32)
CREATININE: 2.04 mg/dL — AB (ref 0.44–1.00)
Chloride: 106 mmol/L (ref 101–111)
GFR, EST AFRICAN AMERICAN: 26 mL/min — AB (ref >60)
GFR, EST NON AFRICAN AMERICAN: 23 mL/min — AB (ref >60)
Glucose, Bld: 94 mg/dL (ref 65–99)
Potassium: 4.5 mmol/L (ref 3.5–5.1)
Sodium: 139 mmol/L (ref 135–145)
Total Bilirubin: 0.7 mg/dL (ref 0.3–1.2)
Total Protein: 6 g/dL — ABNORMAL LOW (ref 6.5–8.1)

## 2017-01-23 LAB — CBC
HCT: 34.5 % — ABNORMAL LOW (ref 36.0–46.0)
HEMOGLOBIN: 10.8 g/dL — AB (ref 12.0–15.0)
MCH: 30.6 pg (ref 26.0–34.0)
MCHC: 31.3 g/dL (ref 30.0–36.0)
MCV: 97.7 fL (ref 78.0–100.0)
Platelets: 133 10*3/uL — ABNORMAL LOW (ref 150–400)
RBC: 3.53 MIL/uL — AB (ref 3.87–5.11)
RDW: 14.8 % (ref 11.5–15.5)
WBC: 2.3 10*3/uL — ABNORMAL LOW (ref 4.0–10.5)

## 2017-01-23 LAB — SURGICAL PCR SCREEN
MRSA, PCR: NEGATIVE
Staphylococcus aureus: POSITIVE — AB

## 2017-01-23 LAB — APTT: aPTT: 31 seconds (ref 24–36)

## 2017-01-23 NOTE — Progress Notes (Signed)
Pt. Notified of results of nasal swab. Rx. Called to [facility] in [place].

## 2017-01-24 LAB — TYPE AND SCREEN
ABO/RH(D): O POS
Antibody Screen: NEGATIVE

## 2017-01-24 NOTE — Progress Notes (Signed)
Anesthesia Chart Review:  Pt is a 76 year old female scheduled for R CEA on 01/25/2017 with Coral ElseVance Brabham, MD  - PCP is Samuel Jesterynthia Butler, DO - Saw cardiologist Nona DellSamuel McDowell, MD 01/09/17 for pre--op eval. Stress test and echo ordered, results below. Pt cleared for surgery (see comment on stress test results) - Nephrologist is Primitivo GauzeMichael Mattingly, MD  PMH includes:  HTN, multifocal atrial tachycardia (in 2016, in the setting of symptomatic anemia and cellulitis), DM, hyperlipidemia, CKD (s/p renal transplant), carotid artery stenosis, hypothyroidism, iron deficiency anemia, GERD. Never smoker. BMI 16  Medications include: ASA 81 mg, CellCept, levothyroxine, metoprolol, Protonix, Prograf  BP (!) 145/64   Pulse 69   Temp 36.7 C (Oral)   Resp 18   Ht 5\' 3"  (1.6 m)   Wt 90 lb 5 oz (41 kg)   SpO2 100%   BMI 16.00 kg/m   Preoperative labs reviewed.   - Cr 2.04, BUN 20; this is consistent with prior results.  - WBC 2.3; this appears to be pt's baseline.   EKG 10/16/16: sinus rhythm with rightward axis  Echo 01/17/17:  - Left ventricle: The cavity size was normal. Wall thickness was normal. Systolic function was vigorous. The estimated ejection fraction was in the range of 65% to 70%. Wall motion was normal; there were no regional wall motion abnormalities. Left ventricular diastolic function parameters were normal. - Aortic valve: Valve area (VTI): 2.47 cm^2. Valve area (Vmax): 2.89 cm^2. - Technically adequate study.  Nuclear stress test 01/17/17:   The study is normal.  This is a low risk study. There are no pefusion defects consistent with prior infarct or current ischemia.  The left ventricular ejection fraction is hyperdynamic (>65%).  Carotid duplex 12/19/16:  1. R ICA velocities suggest 80-99% stenosis. 2. L ICA velocities suggest 40-59% stenosis  If no changes, I anticipate pt can proceed with surgery as scheduled.   Rica Mastngela Jakarri Lesko, FNP-BC [facility] Short Stay Surgical  Center/Anesthesiology Phone: [phone] 01/24/2017 9:38 AM

## 2017-01-25 ENCOUNTER — Encounter (HOSPITAL_COMMUNITY): Payer: Self-pay

## 2017-01-25 ENCOUNTER — Inpatient Hospital Stay (HOSPITAL_COMMUNITY): Payer: Medicare Other | Admitting: Emergency Medicine

## 2017-01-25 ENCOUNTER — Inpatient Hospital Stay (HOSPITAL_COMMUNITY)
Admission: RE | Admit: 2017-01-25 | Discharge: 2017-01-26 | DRG: 038 | Disposition: A | Discharge status: Home / Self Care | Payer: Medicare Other | Attending: Surgery | Admitting: Surgery

## 2017-01-25 ENCOUNTER — Encounter (HOSPITAL_COMMUNITY)
Admission: RE | Disposition: A | Discharge status: Home / Self Care | Payer: Self-pay | Source: Home / Self Care | Attending: Surgery

## 2017-01-25 ENCOUNTER — Inpatient Hospital Stay (HOSPITAL_COMMUNITY): Payer: Medicare Other | Admitting: Certified Registered"

## 2017-01-25 DIAGNOSIS — K219 Gastro-esophageal reflux disease without esophagitis: Secondary | ICD-10-CM | POA: Diagnosis present

## 2017-01-25 DIAGNOSIS — E1122 Type 2 diabetes mellitus with diabetic chronic kidney disease: Secondary | ICD-10-CM | POA: Diagnosis present

## 2017-01-25 DIAGNOSIS — Z7982 Long term (current) use of aspirin: Secondary | ICD-10-CM

## 2017-01-25 DIAGNOSIS — I6523 Occlusion and stenosis of bilateral carotid arteries: Secondary | ICD-10-CM | POA: Diagnosis present

## 2017-01-25 DIAGNOSIS — M1991 Primary osteoarthritis, unspecified site: Secondary | ICD-10-CM | POA: Diagnosis present

## 2017-01-25 DIAGNOSIS — N189 Chronic kidney disease, unspecified: Secondary | ICD-10-CM | POA: Diagnosis present

## 2017-01-25 DIAGNOSIS — D509 Iron deficiency anemia, unspecified: Secondary | ICD-10-CM | POA: Diagnosis present

## 2017-01-25 DIAGNOSIS — Z888 Allergy status to other drugs, medicaments and biological substances status: Secondary | ICD-10-CM

## 2017-01-25 DIAGNOSIS — Z885 Allergy status to narcotic agent status: Secondary | ICD-10-CM

## 2017-01-25 DIAGNOSIS — E039 Hypothyroidism, unspecified: Secondary | ICD-10-CM | POA: Diagnosis present

## 2017-01-25 DIAGNOSIS — I6521 Occlusion and stenosis of right carotid artery: Secondary | ICD-10-CM | POA: Diagnosis present

## 2017-01-25 DIAGNOSIS — Z940 Kidney transplant status: Secondary | ICD-10-CM | POA: Diagnosis not present

## 2017-01-25 DIAGNOSIS — Z79899 Other long term (current) drug therapy: Secondary | ICD-10-CM | POA: Diagnosis not present

## 2017-01-25 DIAGNOSIS — I129 Hypertensive chronic kidney disease with stage 1 through stage 4 chronic kidney disease, or unspecified chronic kidney disease: Secondary | ICD-10-CM | POA: Diagnosis present

## 2017-01-25 DIAGNOSIS — E785 Hyperlipidemia, unspecified: Secondary | ICD-10-CM | POA: Diagnosis present

## 2017-01-25 HISTORY — PX: ENDARTERECTOMY: SHX5162

## 2017-01-25 LAB — GLUCOSE, CAPILLARY
GLUCOSE-CAPILLARY: 87 mg/dL (ref 65–99)
GLUCOSE-CAPILLARY: 95 mg/dL (ref 65–99)

## 2017-01-25 SURGERY — ENDARTERECTOMY, CAROTID
Anesthesia: General | Site: Neck | Laterality: Right

## 2017-01-25 MED ORDER — CHLORHEXIDINE GLUCONATE 4 % EX LIQD: 60.0000 mL | Freq: Once | CUTANEOUS | Status: DC

## 2017-01-25 MED ORDER — SODIUM CHLORIDE 0.9 % IV SOLN
INTRAVENOUS | Status: DC | PRN
  Administered 2017-01-25: 18:00:00 500 mL

## 2017-01-25 MED ORDER — PHENYLEPHRINE 40 MCG/ML (10ML) SYRINGE FOR IV PUSH (FOR BLOOD PRESSURE SUPPORT)
PREFILLED_SYRINGE | INTRAVENOUS | Status: DC | PRN
  Administered 2017-01-25: 40 ug via INTRAVENOUS

## 2017-01-25 MED ORDER — MIDAZOLAM HCL 2 MG/2ML IJ SOLN
INTRAMUSCULAR | Status: AC
  Filled 2017-01-25: qty 2

## 2017-01-25 MED ORDER — HEMOSTATIC AGENTS (NO CHARGE) OPTIME
TOPICAL | Status: DC | PRN
  Administered 2017-01-25: 1 via TOPICAL

## 2017-01-25 MED ORDER — MYCOPHENOLATE MOFETIL 250 MG PO CAPS
250.0000 mg | ORAL_CAPSULE | ORAL | Status: AC
  Administered 2017-01-25: 250 mg via ORAL
  Filled 2017-01-25: qty 1

## 2017-01-25 MED ORDER — ONDANSETRON HCL 4 MG/2ML IJ SOLN
4.0000 mg | Freq: Four times a day (QID) | INTRAMUSCULAR | Status: DC | PRN
  Filled 2017-01-25: qty 2

## 2017-01-25 MED ORDER — PHENOL 1.4 % MT LIQD: 1.0000 | OROMUCOSAL | Status: DC | PRN

## 2017-01-25 MED ORDER — ROCURONIUM BROMIDE 10 MG/ML (PF) SYRINGE
PREFILLED_SYRINGE | INTRAVENOUS | Status: DC | PRN
  Administered 2017-01-25: 40 mg via INTRAVENOUS

## 2017-01-25 MED ORDER — DEXAMETHASONE SODIUM PHOSPHATE 10 MG/ML IJ SOLN
INTRAMUSCULAR | Status: DC | PRN
  Administered 2017-01-25: 10 mg via INTRAVENOUS

## 2017-01-25 MED ORDER — SODIUM CHLORIDE 0.9 % IV SOLN: INTRAVENOUS | Status: DC

## 2017-01-25 MED ORDER — FENTANYL CITRATE (PF) 100 MCG/2ML IJ SOLN
25.0000 ug | INTRAMUSCULAR | Status: DC | PRN
  Administered 2017-01-25 (×3): 25 ug via INTRAVENOUS

## 2017-01-25 MED ORDER — FENTANYL CITRATE (PF) 250 MCG/5ML IJ SOLN
INTRAMUSCULAR | Status: AC
  Filled 2017-01-25: qty 5

## 2017-01-25 MED ORDER — INSULIN ASPART 100 UNIT/ML ~~LOC~~ SOLN
0.0000 [IU] | Freq: Three times a day (TID) | SUBCUTANEOUS | Status: DC
  Administered 2017-01-26 (×2): 2 [IU] via SUBCUTANEOUS

## 2017-01-25 MED ORDER — HYDRALAZINE HCL 20 MG/ML IJ SOLN
INTRAMUSCULAR | Status: AC
  Filled 2017-01-25: qty 1

## 2017-01-25 MED ORDER — METOPROLOL TARTRATE 5 MG/5ML IV SOLN: 2.0000 mg | INTRAVENOUS | Status: DC | PRN

## 2017-01-25 MED ORDER — ACETAMINOPHEN 325 MG PO TABS: 325.0000 mg | ORAL_TABLET | ORAL | Status: DC | PRN

## 2017-01-25 MED ORDER — HYDRALAZINE HCL 20 MG/ML IJ SOLN
5.0000 mg | INTRAMUSCULAR | Status: DC | PRN
  Administered 2017-01-25: 5 mg via INTRAVENOUS
  Filled 2017-01-25: qty 0.25

## 2017-01-25 MED ORDER — LABETALOL HCL 5 MG/ML IV SOLN
10.0000 mg | INTRAVENOUS | Status: DC | PRN
  Filled 2017-01-25 (×2): qty 4

## 2017-01-25 MED ORDER — DOCUSATE SODIUM 100 MG PO CAPS: 100.0000 mg | ORAL_CAPSULE | Freq: Every day | ORAL | Status: DC

## 2017-01-25 MED ORDER — PROPOFOL 10 MG/ML IV BOLUS
INTRAVENOUS | Status: AC
  Filled 2017-01-25: qty 20

## 2017-01-25 MED ORDER — POLYETHYLENE GLYCOL 3350 17 G PO PACK: 17.0000 g | PACK | Freq: Every day | ORAL | Status: DC | PRN

## 2017-01-25 MED ORDER — OXYCODONE HCL 5 MG PO TABS
5.0000 mg | ORAL_TABLET | Freq: Four times a day (QID) | ORAL | Status: DC | PRN
  Administered 2017-01-25 – 2017-01-26 (×2): 5 mg via ORAL
  Filled 2017-01-25 (×2): qty 1

## 2017-01-25 MED ORDER — BISACODYL 10 MG RE SUPP: 10.0000 mg | Freq: Every day | RECTAL | Status: DC | PRN

## 2017-01-25 MED ORDER — SODIUM CHLORIDE 0.9 % IV SOLN: 500.0000 mL | Freq: Once | INTRAVENOUS | Status: DC | PRN

## 2017-01-25 MED ORDER — ACETAMINOPHEN 650 MG RE SUPP: 325.0000 mg | RECTAL | Status: DC | PRN

## 2017-01-25 MED ORDER — ASPIRIN EC 81 MG PO TBEC: 81.0000 mg | DELAYED_RELEASE_TABLET | Freq: Every day | ORAL | Status: DC

## 2017-01-25 MED ORDER — STERILE WATER FOR IRRIGATION IR SOLN
Status: DC | PRN
  Administered 2017-01-25: 1000 mL

## 2017-01-25 MED ORDER — SODIUM CHLORIDE 0.9 % IV SOLN
INTRAVENOUS | Status: DC
  Administered 2017-01-25: 13:00:00 via INTRAVENOUS

## 2017-01-25 MED ORDER — LIDOCAINE 2% (20 MG/ML) 5 ML SYRINGE
INTRAMUSCULAR | Status: DC | PRN
  Administered 2017-01-25: 50 mg via INTRAVENOUS

## 2017-01-25 MED ORDER — ONDANSETRON HCL 4 MG/2ML IJ SOLN
4.0000 mg | Freq: Once | INTRAMUSCULAR | Status: AC
  Administered 2017-01-25: 4 mg via INTRAVENOUS

## 2017-01-25 MED ORDER — FLUCONAZOLE 100 MG PO TABS
200.0000 mg | ORAL_TABLET | Freq: Every day | ORAL | Status: DC
  Administered 2017-01-26: 200 mg via ORAL
  Filled 2017-01-25: qty 2

## 2017-01-25 MED ORDER — 0.9 % SODIUM CHLORIDE (POUR BTL) OPTIME
TOPICAL | Status: DC | PRN
  Administered 2017-01-25: 1000 mL

## 2017-01-25 MED ORDER — SODIUM BICARBONATE 650 MG PO TABS
1300.0000 mg | ORAL_TABLET | Freq: Every day | ORAL | Status: DC
  Administered 2017-01-26: 1300 mg via ORAL
  Filled 2017-01-25: qty 2

## 2017-01-25 MED ORDER — HYDROMORPHONE HCL 1 MG/ML IJ SOLN: 0.2500 mg | INTRAMUSCULAR | Status: DC | PRN

## 2017-01-25 MED ORDER — METOPROLOL TARTRATE 50 MG PO TABS
50.0000 mg | ORAL_TABLET | Freq: Two times a day (BID) | ORAL | Status: DC
  Administered 2017-01-25 – 2017-01-26 (×2): 50 mg via ORAL
  Filled 2017-01-25 (×2): qty 1

## 2017-01-25 MED ORDER — ONDANSETRON HCL 4 MG/2ML IJ SOLN
INTRAMUSCULAR | Status: DC | PRN
  Administered 2017-01-25: 4 mg via INTRAVENOUS

## 2017-01-25 MED ORDER — PROTAMINE SULFATE 10 MG/ML IV SOLN
INTRAVENOUS | Status: DC | PRN
  Administered 2017-01-25 (×2): 10 mg via INTRAVENOUS
  Administered 2017-01-25: 20 mg via INTRAVENOUS
  Administered 2017-01-25: 10 mg via INTRAVENOUS

## 2017-01-25 MED ORDER — GUAIFENESIN-DM 100-10 MG/5ML PO SYRP: 15.0000 mL | ORAL_SOLUTION | ORAL | Status: DC | PRN

## 2017-01-25 MED ORDER — FENTANYL CITRATE (PF) 100 MCG/2ML IJ SOLN
INTRAMUSCULAR | Status: DC | PRN
  Administered 2017-01-25 (×3): 50 ug via INTRAVENOUS

## 2017-01-25 MED ORDER — SUGAMMADEX SODIUM 200 MG/2ML IV SOLN
INTRAVENOUS | Status: DC | PRN
  Administered 2017-01-25: 81.6 mg via INTRAVENOUS

## 2017-01-25 MED ORDER — PANTOPRAZOLE SODIUM 40 MG PO TBEC
40.0000 mg | DELAYED_RELEASE_TABLET | Freq: Two times a day (BID) | ORAL | Status: DC
  Administered 2017-01-25 – 2017-01-26 (×2): 40 mg via ORAL
  Filled 2017-01-25 (×2): qty 1

## 2017-01-25 MED ORDER — MYCOPHENOLATE MOFETIL 250 MG PO CAPS
250.0000 mg | ORAL_CAPSULE | Freq: Two times a day (BID) | ORAL | Status: DC
  Administered 2017-01-26: 250 mg via ORAL
  Filled 2017-01-25: qty 1

## 2017-01-25 MED ORDER — HEPARIN SODIUM (PORCINE) 1000 UNIT/ML IJ SOLN
INTRAMUSCULAR | Status: DC | PRN
  Administered 2017-01-25: 4000 [IU] via INTRAVENOUS
  Administered 2017-01-25: 500 [IU] via INTRAVENOUS
  Administered 2017-01-25: 1000 [IU] via INTRAVENOUS

## 2017-01-25 MED ORDER — DEXAMETHASONE SODIUM PHOSPHATE 10 MG/ML IJ SOLN
INTRAMUSCULAR | Status: AC
  Filled 2017-01-25: qty 1

## 2017-01-25 MED ORDER — CLONAZEPAM 1 MG PO TABS
1.0000 mg | ORAL_TABLET | Freq: Every day | ORAL | Status: DC
  Administered 2017-01-25: 1 mg via ORAL
  Filled 2017-01-25: qty 1

## 2017-01-25 MED ORDER — LIDOCAINE HCL (PF) 1 % IJ SOLN
INTRAMUSCULAR | Status: AC
  Filled 2017-01-25: qty 30

## 2017-01-25 MED ORDER — FENTANYL CITRATE (PF) 100 MCG/2ML IJ SOLN
INTRAMUSCULAR | Status: AC
  Filled 2017-01-25: qty 2

## 2017-01-25 MED ORDER — PROPOFOL 10 MG/ML IV BOLUS
INTRAVENOUS | Status: DC | PRN
  Administered 2017-01-25: 20 mg via INTRAVENOUS
  Administered 2017-01-25: 10 mg via INTRAVENOUS
  Administered 2017-01-25: 100 mg via INTRAVENOUS
  Administered 2017-01-25: 20 mg via INTRAVENOUS

## 2017-01-25 MED ORDER — POTASSIUM CHLORIDE CRYS ER 20 MEQ PO TBCR: 20.0000 meq | EXTENDED_RELEASE_TABLET | Freq: Every day | ORAL | Status: DC | PRN

## 2017-01-25 MED ORDER — MAGNESIUM OXIDE 400 (241.3 MG) MG PO TABS
400.0000 mg | ORAL_TABLET | Freq: Every day | ORAL | Status: DC
  Administered 2017-01-26: 400 mg via ORAL
  Filled 2017-01-25: qty 1

## 2017-01-25 MED ORDER — TACROLIMUS 1 MG PO CAPS
1.0000 mg | ORAL_CAPSULE | Freq: Every day | ORAL | Status: DC
  Administered 2017-01-26: 1 mg via ORAL
  Filled 2017-01-25: qty 1

## 2017-01-25 MED ORDER — DICYCLOMINE HCL 10 MG PO CAPS
10.0000 mg | ORAL_CAPSULE | Freq: Three times a day (TID) | ORAL | Status: DC | PRN
  Filled 2017-01-25: qty 1

## 2017-01-25 MED ORDER — ONDANSETRON HCL 4 MG/2ML IJ SOLN
INTRAMUSCULAR | Status: AC
Start: 2017-01-25 — End: 2017-01-26
  Filled 2017-01-25: qty 2

## 2017-01-25 MED ORDER — HEPARIN SODIUM (PORCINE) 1000 UNIT/ML IJ SOLN
INTRAMUSCULAR | Status: AC
  Filled 2017-01-25: qty 1

## 2017-01-25 MED ORDER — LEVOTHYROXINE SODIUM 75 MCG PO TABS
75.0000 ug | ORAL_TABLET | Freq: Every day | ORAL | Status: DC
  Administered 2017-01-26: 75 ug via ORAL
  Filled 2017-01-25: qty 1

## 2017-01-25 MED ORDER — DEXTROSE 5 % IV SOLN
INTRAVENOUS | Status: DC | PRN
  Administered 2017-01-25: 25 ug/min via INTRAVENOUS

## 2017-01-25 MED ORDER — DEXTROSE 5 % IV SOLN
1.5000 g | Freq: Two times a day (BID) | INTRAVENOUS | Status: DC
  Administered 2017-01-26: 1.5 g via INTRAVENOUS
  Filled 2017-01-25 (×3): qty 1.5

## 2017-01-25 MED ORDER — DIPHENOXYLATE-ATROPINE 2.5-0.025 MG PO TABS: 1.0000 | ORAL_TABLET | Freq: Four times a day (QID) | ORAL | Status: DC | PRN

## 2017-01-25 MED ORDER — DEXTROSE 5 % IV SOLN
1.5000 g | INTRAVENOUS | Status: AC
  Administered 2017-01-25: 1.5 g via INTRAVENOUS
  Filled 2017-01-25: qty 1.5

## 2017-01-25 MED ORDER — ACETAMINOPHEN 160 MG/5ML PO SOLN: 325.0000 mg | ORAL | Status: DC | PRN

## 2017-01-25 SURGICAL SUPPLY — 53 items
CANISTER SUCT 3000ML PPV (MISCELLANEOUS) ×3 IMPLANT
CATH ROBINSON RED A/P 18FR (CATHETERS) ×3 IMPLANT
CATH SUCT 10FR WHISTLE TIP (CATHETERS) ×3 IMPLANT
CLIP VESOCCLUDE MED 6/CT (CLIP) IMPLANT
CLIP VESOCCLUDE SM WIDE 6/CT (CLIP) ×9 IMPLANT
COVER PROBE W GEL 5X96 (DRAPES) IMPLANT
CRADLE DONUT ADULT HEAD (MISCELLANEOUS) ×3 IMPLANT
DERMABOND ADHESIVE PROPEN (GAUZE/BANDAGES/DRESSINGS) ×2
DERMABOND ADVANCED (GAUZE/BANDAGES/DRESSINGS) ×2
DERMABOND ADVANCED .7 DNX12 (GAUZE/BANDAGES/DRESSINGS) ×1 IMPLANT
DERMABOND ADVANCED .7 DNX6 (GAUZE/BANDAGES/DRESSINGS) ×1 IMPLANT
DRAIN CHANNEL 15F RND FF W/TCR (WOUND CARE) ×3 IMPLANT
ELECT REM PT RETURN 9FT ADLT (ELECTROSURGICAL) ×3
ELECTRODE REM PT RTRN 9FT ADLT (ELECTROSURGICAL) ×1 IMPLANT
EVACUATOR SILICONE 100CC (DRAIN) IMPLANT
GAUZE SPONGE 2X2 8PLY STRL LF (GAUZE/BANDAGES/DRESSINGS) ×1 IMPLANT
GLOVE BIO SURGEON STRL SZ 6.5 (GLOVE) ×10 IMPLANT
GLOVE BIO SURGEON STRL SZ7 (GLOVE) ×6 IMPLANT
GLOVE BIO SURGEONS STRL SZ 6.5 (GLOVE) ×5
GLOVE BIOGEL PI IND STRL 7.5 (GLOVE) ×4 IMPLANT
GLOVE BIOGEL PI INDICATOR 7.5 (GLOVE) ×8
GLOVE SURG SS PI 7.5 STRL IVOR (GLOVE) ×3 IMPLANT
GOWN STRL REUS W/ TWL LRG LVL3 (GOWN DISPOSABLE) ×2 IMPLANT
GOWN STRL REUS W/ TWL XL LVL3 (GOWN DISPOSABLE) ×1 IMPLANT
GOWN STRL REUS W/TWL LRG LVL3 (GOWN DISPOSABLE) ×4
GOWN STRL REUS W/TWL XL LVL3 (GOWN DISPOSABLE) ×2
HEMOSTAT SNOW SURGICEL 2X4 (HEMOSTASIS) IMPLANT
INSERT FOGARTY SM (MISCELLANEOUS) IMPLANT
KIT BASIN OR (CUSTOM PROCEDURE TRAY) ×3 IMPLANT
KIT ROOM TURNOVER OR (KITS) ×3 IMPLANT
KIT SHUNT ARGYLE CAROTID ART 6 (VASCULAR PRODUCTS) IMPLANT
NEEDLE HYPO 25GX1X1/2 BEV (NEEDLE) IMPLANT
NS IRRIG 1000ML POUR BTL (IV SOLUTION) ×3 IMPLANT
PACK CAROTID (CUSTOM PROCEDURE TRAY) ×3 IMPLANT
PAD ARMBOARD 7.5X6 YLW CONV (MISCELLANEOUS) ×6 IMPLANT
PATCH VASC XENOSURE 1CMX6CM (Vascular Products) ×2 IMPLANT
PATCH VASC XENOSURE 1X6 (Vascular Products) ×1 IMPLANT
SHUNT CAROTID BYPASS 10 (VASCULAR PRODUCTS) IMPLANT
SHUNT CAROTID BYPASS 12FRX15.5 (VASCULAR PRODUCTS) IMPLANT
SPONGE GAUZE 2X2 STER 10/PKG (GAUZE/BANDAGES/DRESSINGS) ×2
SPONGE LAP 18X18 X RAY DECT (DISPOSABLE) ×3 IMPLANT
SUT ETHILON 3 0 PS 1 (SUTURE) ×3 IMPLANT
SUT PROLENE 6 0 BV (SUTURE) ×18 IMPLANT
SUT PROLENE 7 0 BV 1 (SUTURE) ×3 IMPLANT
SUT PROLENE 7 0 BV1 MDA (SUTURE) ×3 IMPLANT
SUT SILK 3 0 (SUTURE)
SUT SILK 3-0 18XBRD TIE 12 (SUTURE) IMPLANT
SUT VIC AB 3-0 SH 27 (SUTURE) ×4
SUT VIC AB 3-0 SH 27X BRD (SUTURE) ×2 IMPLANT
SUT VICRYL 4-0 PS2 18IN ABS (SUTURE) ×3 IMPLANT
SYR CONTROL 10ML LL (SYRINGE) IMPLANT
TAPE CLOTH SURG 4X10 WHT LF (GAUZE/BANDAGES/DRESSINGS) ×3 IMPLANT
WATER STERILE IRR 1000ML POUR (IV SOLUTION) ×3 IMPLANT

## 2017-01-25 NOTE — Anesthesia Postprocedure Evaluation (Signed)
Anesthesia Post Note  Patient: Deanna ShortsLinda T Maddox  Procedure(s) Performed: Procedure(s) (LRB): ENDARTERECTOMY CAROTID-RIGHT (Right)     Patient location during evaluation: PACU Anesthesia Type: General Level of consciousness: awake Pain management: pain level controlled Vital Signs Assessment: post-procedure vital signs reviewed and stable Respiratory status: spontaneous breathing Cardiovascular status: stable Anesthetic complications: no    Last Vitals:  Vitals:   01/25/17 2045 01/25/17 2100  BP: (!) 157/70 (!) 134/59  Pulse: 79   Resp: 12   Temp:    SpO2: 98%     Last Pain:  Vitals:   01/25/17 2051  TempSrc:   PainSc: 6                  Merilynn Haydu

## 2017-01-25 NOTE — Anesthesia Procedure Notes (Signed)
Arterial Line Insertion Start/End9/04/2017 5:29 PM, 01/25/2017 5:36 PM Performed by: Val EagleMOSER, Nori Winegar, anesthesiologist  Patient location: Pre-op. Preanesthetic checklist: patient identified, IV checked, site marked, risks and benefits discussed, surgical consent, monitors and equipment checked, pre-op evaluation, timeout performed and anesthesia consent Left, radial was placed Catheter size: 20 Fr Hand hygiene performed  and maximum sterile barriers used   Attempts: 1 Procedure performed without using ultrasound guided technique. Following insertion, dressing applied and Biopatch. Post procedure assessment: normal and unchanged  Patient tolerated the procedure well with no immediate complications.

## 2017-01-25 NOTE — Progress Notes (Signed)
New Admission Note:   Arrival Method: From PACU via Bed Mental Orientation: A&OX4 Assessment: Completed Skin: Surgical incision IV: L FA Pain: 6/10 Tubes: Art Line, JP Drain Safety Measures: Safety Fall Prevention Plan has been discussed  Admission 4 East Orientation: Patient has been orientated to the room, unit and staff.  Family: daughter at bedside  Orders to be reviewed and implemented. Will continue to monitor the patient. Call light has been placed within reach and bed alarm has been activated. Pt placed on tele monitor. CCMD notified    Gregor HamsAlisha Daruis Swaim, RN

## 2017-01-25 NOTE — Transfer of Care (Signed)
Immediate Anesthesia Transfer of Care Note  Patient: Deanna ShortsLinda T Brevik  Procedure(s) Performed: Procedure(s): ENDARTERECTOMY CAROTID-RIGHT (Right)  Patient Location: PACU  Anesthesia Type:General  Level of Consciousness: awake and alert   Airway & Oxygen Therapy: Patient Spontanous Breathing  Post-op Assessment: Report given to RN and Post -op Vital signs reviewed and stable  Post vital signs: Reviewed and stable  Last Vitals:  Vitals:   01/25/17 2025 01/25/17 2030  BP: (!) 172/83 (!) 175/69  Pulse: 83 79  Resp: (!) 8 10  Temp: (!) 36.4 C   SpO2:      Last Pain:  Vitals:   01/25/17 1232  TempSrc: Oral      Patients Stated Pain Goal: 3 (01/25/17 1247)  Complications: No apparent anesthesia complications

## 2017-01-25 NOTE — Anesthesia Preprocedure Evaluation (Addendum)
Anesthesia Evaluation  Patient identified by MRN, date of birth, ID band Patient awake    Reviewed: Allergy & Precautions, NPO status , Patient's Chart, lab work & pertinent test results, reviewed documented beta blocker date and time   History of Anesthesia Complications Negative for: history of anesthetic complications  Airway Mallampati: III       Dental  (+) Teeth Intact, Dental Advisory Given,    Pulmonary neg pulmonary ROS,    breath sounds clear to auscultation       Cardiovascular hypertension, Pt. on medications and Pt. on home beta blockers + Peripheral Vascular Disease   Rhythm:Regular     Neuro/Psych negative neurological ROS     GI/Hepatic GERD  Medicated and Controlled,  Endo/Other  diabetes, Type 2Hypothyroidism   Renal/GU CRFRenal diseaseS/p kidney transplant     Musculoskeletal  (+) Arthritis ,   Abdominal   Peds  Hematology  (+) anemia ,   Anesthesia Other Findings   Reproductive/Obstetrics                           Anesthesia Physical Anesthesia Plan  ASA: III  Anesthesia Plan: General   Post-op Pain Management:    Induction: Intravenous  PONV Risk Score and Plan: 3 and Ondansetron and Dexamethasone  Airway Management Planned: Oral ETT  Additional Equipment: Arterial line  Intra-op Plan:   Post-operative Plan: Extubation in OR  Informed Consent: I have reviewed the patients History and Physical, chart, labs and discussed the procedure including the risks, benefits and alternatives for the proposed anesthesia with the patient or authorized representative who has indicated his/her understanding and acceptance.   Dental advisory given  Plan Discussed with: CRNA and Surgeon  Anesthesia Plan Comments:         Anesthesia Quick Evaluation

## 2017-01-25 NOTE — Op Note (Signed)
Patient name: Deanna ShortsLinda T Maddox MRN: [id number] DOB: 1940-10-05 Sex: female  01/25/2017 Pre-operative Diagnosis: Asymptomatic   right carotid stenosis Post-operative diagnosis:  Same Surgeon:  Durene CalBrabham, Wells Assistants:  Doreatha MassedSamantha Rhyne Procedure:    right carotid Endarterectomy with bovine pericardial    patch angioplasty Anesthesia:  General Blood Loss:  See anesthesia record Specimens:  None  Findings:  80 %stenosis; Thrombus:  no  Indications:  76 yo with progressive right carotid stenosis which is asymptomatic.  She is here for elective right CEA  Procedure:  The patient was identified in the holding area and [facility]  The patient was then placed supine on the table.   General endotrachial anesthesia was administered.  The patient was prepped and draped in the usual sterile fashion.  A time out was called and antibiotics were administered.  The incision was made along the anterior border of the right sternocleidomastoid muscle.  Cautery was used to dissect through the subcutaneous tissue.  The platysma muscle was divided with cautery.  The internal jugular vein was exposed along its anterior medial border.  The common facial vein was exposed and then divided between 2-0 silk ties and metal clips.  The common carotid artery was then circumferentially exposed and encircled with an umbilical tape.  The vagus nerve was identified and protected.  Next sharp dissection was used to expose the external carotid artery and the superior thyroid artery.  The were encircled with a blue vessel loop and a 2-0 silk tie respectively.  Finally, the internal carotid was carefully dissected free.  An umbilical tape was placed around the internal carotid artery distal to the diseased segment.  The hypoglossal nerve was visualized throughout and protected.  The patient was given systemic heparinization.  A bovine carotid patch was selected and prepared on the back table.  A 10 french shunt was also  prepared.  After blood pressure readings were appropriate and the heparin had been given time to circulate, the internal carotid artery was occluded with a baby Gregory clamp.  The external and common carotid arteries were then occluded with vascular clamps and the 2-0 tie tightened on the superior thyroid artery.  A #11 blade was used to make an arteriotomy in the common carotid artery.  This was extended with Potts scissors along the anterior and lateral border of the common and internal carotid artery.  Approximately 80% stenosis was identified.  There was no thrombus identified.  The 10 french shunt was not placed as there was pulsatile backbleeding.  A kleiner kuntz elevator was used to perform endarterectomy.  An eversion endarterectomy was performed in the external carotid artery.  A good distal endpoint was obtained in the internal carotid artery.  The specimen was removed and sent to pathology.  Heparinized saline was used to irrigate the endarterectomized field.  All potential embolic debris was removed.  Bovine pericardial patch angioplasty was then performed using a running 6-0 Prolene.. The common internal and external carotid arteries were all appropriately flushed. The artery was again irrigated with heparin saline.  The anastomosis was then secured. The clamp was first released on the external carotid artery followed by the common carotid artery approximately 30 seconds later, bloodflow was reestablish through the internal carotid artery.  Next, a hand-held  Doppler was used to evaluate the signals in the common, external, and internal  carotid arteries, all of which had appropriate signals. There was a tear at the distal patch.  I had  to re-clamp the artery and add an additional patch to the distal part of the incision in order to repair this. I then administered  50 mg protamine. The wound was then irrigated.  I elected to place a 15 Blake drain because there was diffuse oozing from raw  surfaces. After hemostasis was achieved, the carotid sheath was reapproximated with 3-0 Vicryl. The  platysma muscle was reapproximated with running 3-0 Vicryl. The skin  was closed with 4-0 Vicryl. Dermabond was placed on the skin. The  patient was then successfully extubated. Her neurologic exam was  similar to his preprocedural exam. The patient was then taken to recovery room  in stable condition. There were no complications.     Disposition:  To PACU in stable condition.  Relevant Operative Details:  Normal anatomy.  Her tissue was very friable.  I did not place a shunt as she had pulsatile backbleeding.  After the initial repair, there was a tear at the distal patch.  In order to repair this, I re-occluded the arteries and cut the suture from the distal patch.  I opened the distal ICA through the ter.  The intimal was repaired with 7-0 prolene.  I used an additional 1cm of bovine pericardium to patch the distal artery.  I then sutured the new patch to the original patch with 6-0 prolene.  I placed a 15 Blake drain due to diffuse oozing from raw surfaces.  She awoke neurologically intact.  Juleen China, M.D. [facility] Office: [phone] Pager:  [phone]

## 2017-01-25 NOTE — H&P (Signed)
Chief Complaint: Follow up Extracranial Carotid Artery Stenosis   History of Present Illness  [name] is a 76 y.o. female who returns today for follow-up. [name] initially met her in February 2016 for evaluation of carotid artery stenosis. She had a carotid ultrasound performed at another facility that revealed greater than 70% right carotid artery stenosis and less than 50% left carotid stenosis. Ultrasound in our office identified60-79 percent right carotid artery stenosis. Medical management was recommended given that she was asymptomatic.   She was hospitalized for a kidney infection in 2016. She also suffered aspiration pneumonia which led to intubation. She has recovered from this. She also lost a significant amount of weight and is trying to gain that back. She denies post prandial abdominal pian, denies food fear. She is losing weight but was again recently hospitalized for kidney infection from E.coli. She was discharge from the hospital a few days ago and istaking oral antibx.   The patient has a history of a kidney transplant, managed by Dr. Mercy Moore. 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 She had a right-sided catheter for 2 months prior to her living related transplant. The patient is intolerant of statins as these cause leg pain. She is medically managed for hypertension. She is a nonsmoker.  Patient has nothad previous carotid artery intervention.  The patient denies any history of TIA or stroke symptoms, specifically the patient denies a history of amaurosis fugax or monocular blindness, denies a history unilateral of facial drooping, denies a history of hemiplegia, and denies a history of receptive or expressive aphasia.   Pt denies any claudication sx's with walking.   She had iron deficiency anemia, received iron infusions on 3 separate occasions, the last was January 2017, is also receiving Aranesp.   Pt Diabetic: August 2016 A1C  was 5.2 (review of records); she states she did not have DM until after the transplant.  Pt smoker: non-smoker  Pt meds include: Statin : no, causes leg pain ASA: yes Other anticoagulants/antiplatelets: no       Past Medical History:  Diagnosis Date  . Anemia   . Arthritis    "knees" (10/16/2014)  . Carotid artery stenosis   . Chronic kidney disease    h/o transplant  . Family history of adverse reaction to anesthesia    "my daughter; they thought she was asleep and she wasn't"  . Frequent UTI   . GERD (gastroesophageal reflux disease)   . History of blood transfusion "several"   "all related to anemia"  . Hyperlipemia   . Hypertension   . Hypothyroidism   . Pneumonia "1-2 times"  . Shingles   . Type II diabetes mellitus ([place])     Social History     Social History  Substance Use Topics  . Smoking status: Never Smoker  . Smokeless tobacco: Never Used  . Alcohol use No    Family History      Family History  Problem Relation Age of Onset  . Diabetes Mother   . Hypertension Mother   . Melanoma Mother   . Hyperlipidemia Mother   . Heart disease Father   . Varicose Veins Father   . Hypertension Sister   . Cancer Daughter   . Hypertension Son   . Hyperlipidemia Sister   . Hyperlipidemia Daughter   . Hyperlipidemia Son   . Hypertension Daughter     Surgical History      Past Surgical History:  Procedure Laterality Date  . ABDOMINAL  HYSTERECTOMY    . APPENDECTOMY    . BACK SURGERY    . CHOLECYSTECTOMY OPEN    . KIDNEY SURGERY     multiple  . KIDNEY TRANSPLANT Right 2004   at [facility]  . LUMBAR DISC SURGERY     "L5"  . THYROIDECTOMY     1/2 removed  . TONSILLECTOMY    . TUBAL LIGATION           Allergies  Allergen Reactions  . Codeine Nausea And Vomiting  . Elavil [Amitriptyline] Other (See Comments)    unknown  . Iodinated Diagnostic Agents      Renal transplant pt  . Lipitor  [Atorvastatin]   . Magnesium Hives    Pt tolerated oral and IV during 06/2014 admission   . Metoclopramide     Other reaction(s): Other (See Comments) "doesn't work"  . Morphine     Other reaction(s): Confusion (intolerance)  . Morphine And Related   . Neurontin [Gabapentin]   . Niacin And Related   . Norvasc [Amlodipine Besylate]   . Talwin [Pentazocine]   . Ultram [Tramadol]           Current Outpatient Prescriptions  Medication Sig Dispense Refill  . aspirin 81 MG tablet Take 81 mg by mouth at bedtime.     . clonazePAM (KLONOPIN) 1 MG tablet Take 1 mg by mouth at bedtime.    . dicyclomine (BENTYL) 10 MG capsule Take 10 mg by mouth 3 (three) times daily before meals.     . diphenoxylate-atropine (LOMOTIL) 2.5-0.025 MG tablet Take 1 tablet by mouth 4 (four) times daily as needed for diarrhea or loose stools.    . hyoscyamine (LEVSIN, ANASPAZ) 0.125 MG tablet Take 0.125 mg by mouth every 4 (four) hours as needed for cramping.     Marland Kitchen levothyroxine (SYNTHROID, LEVOTHROID) 75 MCG tablet Take 75 mcg by mouth daily before breakfast.    . magnesium oxide (MAG-OX) 400 MG tablet Take 400 mg by mouth daily.    . metoprolol (LOPRESSOR) 50 MG tablet Take 50 mg by mouth 2 (two) times daily.    . mycophenolate (CELLCEPT) 500 MG tablet Take 250 mg by mouth 2 (two) times daily.     . ondansetron (ZOFRAN ODT) 4 MG disintegrating tablet Take 1 tablet (4 mg total) by mouth every 8 (eight) hours as needed for nausea or vomiting. 15 tablet 0  . pantoprazole (PROTONIX) 40 MG tablet Take 40 mg by mouth 2 (two) times daily.   5  . pregabalin (LYRICA) 50 MG capsule Take 50 mg by mouth 1 day or 1 dose.    . sodium bicarbonate 650 MG tablet Take 1 tablet (650 mg total) by mouth 2 (two) times daily. (Patient taking differently: Take 1,300 mg by mouth daily. ) 60 tablet 0  . tacrolimus (PROGRAF) 0.5 MG capsule Take 1 mg by mouth daily.     Marland Kitchen acyclovir (ZOVIRAX) 400 MG  tablet Take 2 tablets (800 mg total) by mouth 5 (five) times daily. (Patient not taking: Reported on 12/19/2016) 35 tablet 0  . Blood Glucose Monitoring Suppl (ACCU-CHEK AVIVA PLUS) w/Device KIT Use as directed 2 x daily (Patient not taking: Reported on 12/19/2016) 1 kit 0  . glucose blood (ACCU-CHEK AVIVA) test strip Use as instructed bid (Patient not taking: Reported on 12/19/2016) 100 each 5  . HYDROcodone-acetaminophen (NORCO/VICODIN) 5-325 MG tablet Take 1-2 tablets by mouth every 4 (four) hours as needed. (Patient not taking: Reported on 12/19/2016) 20 tablet 0  .  Pancrelipase, Lip-Prot-Amyl, (CREON) 24000-76000 units CPEP 2  Capsule With Meals and  1 capsule snacks. (Patient not taking: Reported on 12/19/2016) 270 capsule 3  . predniSONE (DELTASONE) 10 MG tablet Take 1 tablet (10 mg total) by mouth daily with breakfast. (Patient not taking: Reported on 12/19/2016) 15 tablet 0   No current facility-administered medications for this visit.     Review of Systems : See HPI for pertinent positives and negatives.  Physical Examination      Vitals:   12/19/16 1044 12/19/16 1046  BP: 130/89 129/61  Pulse: 72   Resp: 16   Temp: (!) 97.3 F (36.3 C)   TempSrc: Oral   SpO2: 100%   Weight: 91 lb (41.3 kg)   Height: 5' 3"  (1.6 m)    Body mass index is 16.12 kg/m.  General: WDWN thin female in NAD GAIT:normal Eyes: PERRLA Pulmonary: Respirations are non-labored, CTAB, good air movement  Cardiac: regular rhythm and rate, no detected murmur.  VASCULAR EXAM Carotid Bruits Right Left   positive positive  Aorta is not palpable. Radial pulses are 2+ palpable and equal.   LE Pulses Right Left  POPLITEAL 1+ palpable 1+palpable  POSTERIOR TIBIAL 2+palpable 2+ palpable  DORSALIS PEDIS ANTERIOR TIBIAL 1+palpable not palpable    Gastrointestinal: soft, nontender, BS WNL, no r/g, no palpable  masses.  Musculoskeletal: no muscle atrophy/wasting. M/S 5/5 throughout, extremities without ischemic changes.  Neurologic: A&O X 3; Appropriate Affect, Speech is normal CN 2-12 intact, pain and light touch intact in extremities, Motor exam as listed above.     Assessment: ERIANNA JOLLY is a 76 y.o. female who has no history of stroke or TIA. [name] spoke with pt and daughter and examined pt.   DATA Today's carotid duplex suggests 80-99% right ICA stenosis. The left ICA has 40-59% stenosis.  Bilateral vertebral artery flow is antegrade.  Bilateral subclavian artery waveforms are normal.  Increased stenosis of bilateral internal carotid arteries compared to the last exam on 01-13-16.     Plan: Will request cardiac risk stratification prior to scheduling right CEA. Will also request clearance from her nephrologist, Dr. Mercy Moore, due to  Will anticipate 01-18-17 as right CEA surgical date, by [name].    I discussed in depth with the patient the nature of atherosclerosis, and emphasized the importance of maximal medical management including strict control of blood pressure, blood glucose, and lipid levels, obtaining regular exercise, and continued cessation of smoking.  The patient is aware that without maximal medical management the underlying atherosclerotic disease process will progress, limiting the benefit of any interventions. The patient was given information about stroke prevention and what symptoms should prompt the patient to seek immediate medical care. Thank you for allowing [place] to participate in this patient's care.  Clemon Chambers, RN, MSN, FNP-C [facility] Office: [place]: Trula Maddox  12/19/16 10:49 AM   I agree with the above.  I have seen and evaluated the patient.  She has had interval progression of her carotid stenosis on the right.  It is now greater than 80%.  She remains asymptomatic.   Specifically, she denies numbness or weakness in either extremity.  She denies slurred speech.  She denies amaurosis fugax.  We discussed treatment options and have decided to proceed with right carotid endarterectomy.  The risks and benefits of the operation were discussed with the patient which include but are not limited to the risk of stroke, nerve injury, and  cardiopulmonary complications.  I am going to have her evaluated by cardiology as well as nephrology for clearance.  We have tentatively scheduled her operation for Wednesday, September 5.    No changes since I last saw her.  Plan right CEA.  All questions answered.  Annamarie Major

## 2017-01-25 NOTE — Anesthesia Procedure Notes (Addendum)
Procedure Name: Intubation Date/Time: 01/25/2017 5:38 PM Performed by: Sampson Si E Pre-anesthesia Checklist: Patient identified, Emergency Drugs available, Suction available and Patient being monitored Patient Re-evaluated:Patient Re-evaluated prior to induction Oxygen Delivery Method: Circle System Utilized Preoxygenation: Pre-oxygenation with 100% oxygen Induction Type: IV induction Ventilation: Mask ventilation without difficulty Laryngoscope Size: Mac and 3 Grade View: Grade I Tube type: Oral Tube size: 7.0 mm Number of attempts: 1 Airway Equipment and Method: Stylet Placement Confirmation: ETT inserted through vocal cords under direct vision,  positive ETCO2 and breath sounds checked- equal and bilateral Secured at: 19 cm Tube secured with: Tape Dental Injury: Teeth and Oropharynx as per pre-operative assessment

## 2017-01-25 NOTE — Anesthesia Postprocedure Evaluation (Signed)
Anesthesia Post Note  Patient: Scarlette ShortsLinda T Villers  Procedure(s) Performed: Procedure(s) (LRB): ENDARTERECTOMY CAROTID-RIGHT (Right)     Patient location during evaluation: PACU Anesthesia Type: General Level of consciousness: awake Vital Signs Assessment: post-procedure vital signs reviewed and stable Respiratory status: spontaneous breathing Anesthetic complications: no    Last Vitals:  Vitals:   01/25/17 2045 01/25/17 2100  BP: (!) 157/70 (!) 134/59  Pulse: 79   Resp: 12   Temp:    SpO2: 98%     Last Pain:  Vitals:   01/25/17 2051  TempSrc:   PainSc: 6                  Hava Massingale

## 2017-01-26 ENCOUNTER — Encounter (HOSPITAL_COMMUNITY): Payer: Self-pay | Admitting: Surgery

## 2017-01-26 LAB — BASIC METABOLIC PANEL
ANION GAP: 9 (ref 5–15)
BUN: 24 mg/dL — ABNORMAL HIGH (ref 6–20)
CALCIUM: 8.4 mg/dL — AB (ref 8.9–10.3)
CO2: 21 mmol/L — AB (ref 22–32)
CREATININE: 2.03 mg/dL — AB (ref 0.44–1.00)
Chloride: 107 mmol/L (ref 101–111)
GFR, EST AFRICAN AMERICAN: 27 mL/min — AB (ref >60)
GFR, EST NON AFRICAN AMERICAN: 23 mL/min — AB (ref >60)
Glucose, Bld: 173 mg/dL — ABNORMAL HIGH (ref 65–99)
Potassium: 4.7 mmol/L (ref 3.5–5.1)
SODIUM: 137 mmol/L (ref 135–145)

## 2017-01-26 LAB — GLUCOSE, CAPILLARY
GLUCOSE-CAPILLARY: 187 mg/dL — AB (ref 65–99)
Glucose-Capillary: 154 mg/dL — ABNORMAL HIGH (ref 65–99)

## 2017-01-26 LAB — CBC
HCT: 31.8 % — ABNORMAL LOW (ref 36.0–46.0)
Hemoglobin: 9.9 g/dL — ABNORMAL LOW (ref 12.0–15.0)
MCH: 30.1 pg (ref 26.0–34.0)
MCHC: 31.1 g/dL (ref 30.0–36.0)
MCV: 96.7 fL (ref 78.0–100.0)
Platelets: 112 10*3/uL — ABNORMAL LOW (ref 150–400)
RBC: 3.29 MIL/uL — ABNORMAL LOW (ref 3.87–5.11)
RDW: 14.8 % (ref 11.5–15.5)
WBC: 2.3 10*3/uL — ABNORMAL LOW (ref 4.0–10.5)

## 2017-01-26 LAB — POCT ACTIVATED CLOTTING TIME
ACTIVATED CLOTTING TIME: 235 s
ACTIVATED CLOTTING TIME: 219 s

## 2017-01-26 MED ORDER — OXYCODONE HCL 5 MG PO TABS: 5.0000 mg | ORAL_TABLET | Freq: Four times a day (QID) | ORAL | 0 refills | Status: AC | PRN

## 2017-01-26 MED ORDER — SODIUM BICARBONATE 650 MG PO TABS: 1300.0000 mg | ORAL_TABLET | Freq: Every day | ORAL | Status: AC

## 2017-01-26 NOTE — Discharge Summary (Signed)
Discharge Summary     [name] 20-Jul-1940 76 y.o. 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 female who returns today for follow-up. Dr. Trula Slade initially met her in February 2016 for evaluation of carotid artery stenosis. She had a carotid ultrasound performed at another facility that revealed greater than 70% right carotid artery stenosis and less than 50% left carotid stenosis. Ultrasound in our office identified60-79 percent right carotid artery stenosis. Medical management was recommended given that she was asymptomatic.   She was hospitalized for a kidney infection in 2016. She also suffered aspiration pneumonia which led to intubation. She has recovered from this. She also lost a significant amount of weight and is trying to gain that back. She denies post prandial abdominal pian, denies food fear. She is losing weight but was again recently hospitalized for kidney infection from E.coli. She was discharge from the hospital a few days ago and istaking oral antibx.   The patient has a history of a kidney transplant, managed by Dr. Mercy Moore. 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 She had a right-sided catheter for 2 months prior to her living related transplant. The patient is intolerant of statins as these cause leg pain. She is medically managed for hypertension. She is a nonsmoker.  Patient has nothad previous carotid artery intervention.  The patient denies any history of TIA or stroke symptoms, specifically the patient denies a history of amaurosis fugax or monocular blindness, denies a history unilateral of facial drooping, denies a history of hemiplegia, and denies a history of receptive or expressive aphasia.   Pt denies any claudication sx's with walking.   She had iron  deficiency anemia, received iron infusions on 3 separate occasions, the last was January 2017, is also receiving Aranesp.   Pt Diabetic: August 2016 A1C was 5.2 (review of records); she states she did not have DM until after the transplant.  Pt smoker: non-smoker  Pt meds include: Statin : no, causes leg pain ASA: yes Other anticoagulants/antiplatelets: no   Hospital Course:  The patient was admitted to the hospital and taken to the operating room on 01/25/2017 and underwent right carotid endarterectomy.  Intraoperative findings as follows:  80% stenosis with no thrombus.  The pt tolerated the procedure well and was transported to the PACU in good condition.   By POD 1, the pt neuro status was in tact.  Her JP drain was removed and she was discharged later that day.    The remainder of the hospital course consisted of increasing mobilization and increasing intake of solids without difficulty.    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 Stenosis of right carotid artery 01/25/2017  . Family history of adverse reaction to anesthesia   . Thrombocytopenia ([place]) 01/06/2016  . Nodule  of left lung 01/06/2016  . Gram-negative bacteremia 01/06/2016  . AKI (acute kidney injury) ([place]) 12/12/2015  . Diabetes mellitus ([place]) 12/12/2015  . CKD (chronic kidney disease) stage 4, GFR 15-29 ml/min (HCC) 12/12/2015  . Dehydration 12/12/2015  . 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 Type 2 diabetes mellitus with stage 3 chronic kidney disease, with long-term current use  of insulin ([place]) 02/23/2015  . Chest pain 10/18/2014  . Nausea with vomiting 10/18/2014  . SOB (shortness of breath) 10/18/2014  . Demand ischemia ([place]) 10/18/2014  . 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 Pain in the chest   . Skin infection   . Carotid artery stenosis 10/17/2014  . Malnutrition of moderate degree ([place]) 10/17/2014  . Sepsis ([place]) 10/17/2014  . ARF (acute renal failure) ([place])   . Cellulitis 10/16/2014  . Cellulitis of right groin 10/16/2014  . GERD (gastroesophageal reflux disease)   . Hypothyroidism   . History of kidney transplant   . Hyperkalemia   . UTI (lower urinary tract infection)   . Atrial fibrillation ([place]) 05/25/2009  . Anemia 05/06/2009  . Essential hypertension 05/06/2009  . Hyperlipidemia 10/28/2008   Past Medical History:  Diagnosis Date  . Arthritis   . Carotid artery stenosis   . Carotid artery stenosis    Right  . Chronic kidney disease    Kidney transplant  15 years ago  . Essential hypertension   . Frequent UTI   . GERD (gastroesophageal reflux disease)   . History of blood transfusion   . History of pneumonia   . History of renal transplantation    Follows with Dr. Mercy Moore  . Hyperlipemia   . Hypothyroidism   . Iron deficiency anemia   . 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 Shingles   . Type II diabetes mellitus (HCC)    no medications    Allergies as of 01/26/2017      Reactions   Codeine Nausea And Vomiting   Elavil [amitriptyline] Other (See Comments)   unknown   Iodinated Diagnostic Agents Other (See Comments)   Renal transplant pt   Lipitor [atorvastatin] Other (See Comments)   Muscle pain    Magnesium Hives   Pt tolerated oral and IV during 06/2014 admission    Metoclopramide Other (See Comments)   Other reaction(s): Other (See Comments) "doesn't work" "doesn't work" Other reaction(s): Other (See Comments) "doesn't work"   Morphine    Other reaction(s): Confusion  (intolerance)   Morphine And Related Other (See Comments)   hallucinations   Neurontin [gabapentin] Other (See Comments)   Muscle pain    Niacin And Related Other (See Comments)   Muscle pain    Norvasc [amlodipine Besylate] Other (See Comments)   Muscle pain   Talwin [pentazocine] Other (See Comments), Nausea Only   hallucinations   Ultram [tramadol] Itching      Medication List    TAKE these medications   ACCU-CHEK AVIVA PLUS w/Device Kit Use as directed 2 x daily   ARANESP (ALBUMIN FREE) 100 MCG/0.5ML Sosy injection Generic drug:  Darbepoetin Alfa Inject 100 mcg into the skin every 14 (fourteen) days. Last dose 01-18-17   aspirin 81 MG tablet Take 81 mg by mouth at bedtime.   CELLCEPT 250 MG capsule Generic drug:  mycophenolate Take 250 mg by mouth 2 (two) times daily. AT 0800 AND AT 2000   clonazePAM 1 MG tablet Commonly known as:  KLONOPIN Take 1 mg by mouth at bedtime.  dicyclomine 10 MG capsule Commonly known as:  BENTYL Take 10 mg by mouth 3 (three) times daily as needed for spasms.   diphenoxylate-atropine 2.5-0.025 MG tablet Commonly known as:  LOMOTIL Take 1 tablet by mouth 4 (four) times daily as needed for diarrhea or loose stools.   fluconazole 200 MG tablet Commonly known as:  DIFLUCAN Take 200 mg by mouth daily.   glucose blood test strip Commonly known as:  ACCU-CHEK AVIVA Use as instructed bid   levothyroxine 75 MCG tablet Commonly known as:  SYNTHROID, LEVOTHROID Take 1 tablet (75 mcg total) by mouth daily before breakfast.   magnesium oxide 400 MG tablet Commonly known as:  MAG-OX Take 400 mg by mouth daily at 12 noon.   metoprolol tartrate 50 MG tablet Commonly known as:  LOPRESSOR Take 50 mg by mouth 2 (two) times daily.   oxyCODONE 5 MG immediate release tablet Commonly known as:  Oxy IR/ROXICODONE Take 1 tablet (5 mg total) by mouth every 6 (six) hours as needed for moderate pain.   pantoprazole 40 MG tablet Commonly known as:   PROTONIX Take 40 mg by mouth 2 (two) times daily.   sodium bicarbonate 650 MG tablet Take 2 tablets (1,300 mg total) by mouth daily at 12 noon.   tacrolimus 0.5 MG capsule Commonly known as:  PROGRAF Take 1 mg by mouth daily.            Discharge Care Instructions        Start     Ordered   01/26/17 0000  sodium bicarbonate 650 MG tablet  Daily     01/26/17 0731   01/26/17 0000  oxyCODONE (OXY IR/ROXICODONE) 5 MG immediate release tablet  Every 6 hours PRN    Question:  Supervising Provider  Answer:  Serafina Mitchell   01/26/17 0731       Discharge Instructions: .[email or web address]  [facility]  Discharge Instructions   Carotid Endarterectomy (CEA)  Please refer to the following instructions for your post-procedure care. Your surgeon or physician assistant will discuss any changes with you.  Activity  You are encouraged to walk as much as you can. You can slowly return to normal activities but must avoid strenuous activity and heavy lifting until your doctor tell you it's OK. Avoid activities such as vacuuming or swinging a golf club. You can drive after one week if you are comfortable and you are no longer taking prescription pain medications. It is normal to feel tired for serval weeks after your surgery. It is also normal to have difficulty with sleep habits, eating, and bowel movements after surgery. These will go away with time.  Bathing/Showering  You may shower after you come home. Do not soak in a bathtub, hot tub, or swim until the incision heals completely.  Incision Care  Shower every day. Clean your incision with mild soap and water. Pat the area dry with a clean towel. You do not need a bandage unless otherwise instructed. Do not apply any ointments or creams to your incision. You may have skin glue on your incision. Do not peel it off. It will come off on its own in about one week. Your incision may feel thickened and raised for  several weeks after your surgery. This is normal and the skin will soften over time. For Men Only: It's OK to shave around the incision but do not shave the incision itself for 2 weeks. It is common to have numbness  under your chin that could last for several months.  Diet  Resume your normal diet. There are no special food restrictions following this procedure. A low fat/low cholesterol diet is recommended for all patients with vascular disease. In order to heal from your surgery, it is CRITICAL to get adequate nutrition. Your body requires vitamins, minerals, and protein. Vegetables are the best source of vitamins and minerals. Vegetables also provide the perfect balance of protein. Processed food has little nutritional value, so try to avoid this.  Medications  Resume taking all of your medications unless your doctor or physician assistant tells you not to. If your incision is causing pain, you may take over-the- counter pain relievers such as acetaminophen (Tylenol). If you were prescribed a stronger pain medication, please be aware these medications can cause nausea and constipation. Prevent nausea by taking the medication with a snack or meal. Avoid constipation by drinking plenty of fluids and eating foods with a high amount of fiber, such as fruits, vegetables, and grains. Do not take Tylenol if you are taking prescription pain medications.  Follow Up  Our office will schedule a follow up appointment 2-3 weeks following discharge.  Please call us immediately for any of the following conditions  Increased pain, redness, drainage (pus) from your incision site. Fever of 101 degrees or higher. If you should develop stroke (slurred speech, difficulty swallowing, weakness on one side of your body, loss of vision) you should call 911 and go to the nearest emergency room.  Reduce your risk of vascular disease:  Stop smoking. If you would like help call [facility] at 1-800-QUIT-NOW  [phone]) or [place] at [phone]. Manage your cholesterol Maintain a desired weight Control your diabetes Keep your blood pressure down  If you have any questions, please call the office at [phone].  Prescriptions given: Oxycodone#8 No Refill  Disposition: home  Patient's condition: is Good  Follow up: 1. Dr. Trula Slade in 2 weeks.   Leontine Locket, PA-C [facility] [phone]   --- For [facility] use ---   Modified Rankin score at D/C (0-6): 0  IV medication needed for:  1. Hypertension: No 2. Hypotension: No  Post-op Complications: No  1. Post-op CVA or TIA: No  If yes: Event classification (right eye, left eye, right cortical, left cortical, verterobasilar, other): n/a  If yes: Timing of event (intra-op, <6 hrs post-op, >=6 hrs post-op, unknown): n/a  2. CN injury: No  If yes: CN n/a injuried   3. Myocardial infarction: No  If yes: Dx by (EKG or clinical, Troponin): n/a  4.  CHF: No  5.  Dysrhythmia (new): No  6. Wound infection: No  7. Reperfusion symptoms: No  8. Return to OR: No  If yes: return to OR for (bleeding, neurologic, other CEA incision, other): n/a  Discharge medications: Statin use:  No   Allergy ASA use:  Yes  If No:   Beta blocker use:  Yes ACE-Inhibitor use:  No  ARB use:  No CCB use: No P2Y12 Antagonist use: No, _0  Plavix, _1  Plasugrel, _2  Ticlopinine, _3  Ticagrelor, _4  Other, _5  No for medical reason, _6  Non-compliant, _7  Not-indicated Anti-coagulant use:  No, _8  Warfarin, _9  Rivaroxaban, _10  Dabigatran,

## 2017-01-26 NOTE — Care Management Note (Signed)
Case Management Note Donn PieriniKristi Raylynne Cubbage RN, BSN Unit 4E-Case Manager [phone]  Patient Details  Name: Deanna ShortsLinda T Maddox MRN: [id number] Date of Birth: 1941-03-20  Subjective/Objective:    Pt admitted s/p CEA                Action/Plan: PTA pt lived at home- anticipate d/c home - no CM needs noted for discharge at this time- will follow  Expected Discharge Date:    01/26/17              Expected Discharge Plan:  Home/Self Care  In-House Referral:  NA  Discharge planning Services  CM Consult  Post Acute Care Choice:  NA Choice offered to:     DME Arranged:    DME Agency:     HH Arranged:    HH Agency:     Status of Service:  Completed, signed off  If discussed at Long Length of Stay Meetings, dates discussed:    Discharge Disposition: home/self care   Additional Comments:  Darrold SpanWebster, Resean Brander Hall, RN 01/26/2017, 9:48 AM

## 2017-01-26 NOTE — Progress Notes (Signed)
    Subjective  - POD #1  No complaints this am Has eaten and voided  Physical Exam:  Incision soft with ecchymosis, no hematoma Neuro intact  Mionimal JP output     Assessment/Plan:  POD #1, s/p R CEA  D/c JP Anticipate d/c home later this am  Brabham, Anner CreteWells 01/26/2017 7:50 AM --  Vitals:   01/26/17 0505 01/26/17 0720  BP: (!) 159/79 (!) 154/81  Pulse:    Resp: 10 10  Temp:    SpO2: 97% 100%    Intake/Output Summary (Last 24 hours) at 01/26/17 0750 Last data filed at 01/26/17 0720  Gross per 24 hour  Intake             2025 ml  Output             1010 ml  Net             1015 ml     Laboratory CBC    Component Value Date/Time   WBC 2.3 (L) 01/26/2017 0520   HGB 9.9 (L) 01/26/2017 0520   HCT 31.8 (L) 01/26/2017 0520   PLT PENDING 01/26/2017 0520    BMET    Component Value Date/Time   NA 137 01/26/2017 0520   NA 143 06/14/2016 0830   K 4.7 01/26/2017 0520   CL 107 01/26/2017 0520   CO2 21 (L) 01/26/2017 0520   GLUCOSE 173 (H) 01/26/2017 0520   BUN 24 (H) 01/26/2017 0520   BUN 26 06/14/2016 0830   CREATININE 2.03 (H) 01/26/2017 0520   CALCIUM 8.4 (L) 01/26/2017 0520   GFRNONAA 23 (L) 01/26/2017 0520   GFRAA 27 (L) 01/26/2017 0520    COAG Lab Results  Component Value Date   INR 1.12 01/23/2017   INR 1.09 12/12/2015   INR 1.13 10/16/2014   No results found for: PTT  Antibiotics Anti-infectives    Start     Dose/Rate Route Frequency Ordered Stop   01/26/17 1000  fluconazole (DIFLUCAN) tablet 200 mg     200 mg Oral Daily 01/25/17 2149     01/26/17 0530  cefUROXime (ZINACEF) 1.5 g in dextrose 5 % 50 mL IVPB     1.5 g 100 mL/hr over 30 Minutes Intravenous Every 12 hours 01/25/17 2149 01/27/17 0529   01/25/17 1221  cefUROXime (ZINACEF) 1.5 g in dextrose 5 % 50 mL IVPB     1.5 g 100 mL/hr over 30 Minutes Intravenous 30 min pre-op 01/25/17 1221 01/25/17 1748       V. Charlena CrossWells Brabham IV, M.D. [facility]  [place] Office: [phone] Pager:  [phone]

## 2017-01-26 NOTE — Discharge Instructions (Signed)
° °  [facility] [employer] [facility] ° °Discharge Instructions °  °Carotid Endarterectomy (CEA) ° °Please refer to the following instructions for your post-procedure care. Your surgeon or physician assistant will discuss any changes with you. ° °Activity ° °You are encouraged to walk as much as you can. You can slowly return to normal activities but must avoid strenuous activity and heavy lifting until your doctor tell you it's OK. Avoid activities such as vacuuming or swinging a golf club. You can drive after one week if you are comfortable and you are no longer taking prescription pain medications. It is normal to feel tired for serval weeks after your surgery. It is also normal to have difficulty with sleep habits, eating, and bowel movements after surgery. These will go away with time. ° °Bathing/Showering ° °You may shower after you go home. Do not soak in a bathtub, hot tub, or swim until the incision heals completely. ° °Incision Care ° °Shower every day. Clean your incision with mild soap and water. Pat the area dry with a clean towel. You do not need a bandage unless otherwise instructed. Do not apply any ointments or creams to your incision. You may have skin glue on your incision. Do not peel it off. It will come off on its own in about one week. Your incision may feel thickened and raised for several weeks after your surgery. This is normal and the skin will soften over time. For Men Only: It's OK to shave around the incision but do not shave the incision itself for 2 weeks. It is common to have numbness under your chin that could last for several months. ° °Diet ° °Resume your normal diet. There are no special food restrictions following this procedure. A low fat/low cholesterol diet is recommended for all patients with vascular disease. In order to heal from your surgery, it is CRITICAL to get adequate nutrition. Your body requires vitamins, minerals, and protein. Vegetables are the best  source of vitamins and minerals. Vegetables also provide the perfect balance of protein. Processed food has little nutritional value, so try to avoid this. ° °Medications ° °Resume taking all of your medications unless your doctor or physician assistant tells you not to. If your incision is causing pain, you may take over-the- counter pain relievers such as acetaminophen (Tylenol). If you were prescribed a stronger pain medication, please be aware these medications can cause nausea and constipation. Prevent nausea by taking the medication with a snack or meal. Avoid constipation by drinking plenty of fluids and eating foods with a high amount of fiber, such as fruits, vegetables, and grains. Do not take Tylenol if you are taking prescription pain medications. ° °Follow Up ° °Our office will schedule a follow up appointment 2-3 weeks following discharge. ° °Please call us immediately for any of the following conditions ° °Increased pain, redness, drainage (pus) from your incision site. °Fever of 101 degrees or higher. °If you should develop stroke (slurred speech, difficulty swallowing, weakness on one side of your body, loss of vision) you should call 911 and go to the nearest emergency room. ° °Reduce your risk of vascular disease: ° °Stop smoking. If you would like help call [facility] at 1-800-QUIT-NOW (1-800-784-8669) or [place] at [phone]. °Manage your cholesterol °Maintain a desired weight °Control your diabetes °Keep your blood pressure down ° °If you have any questions, please call the office at [phone]. ° °

## 2017-01-30 ENCOUNTER — Telehealth: Payer: Self-pay | Admitting: "Endocrinology

## 2017-01-30 NOTE — Telephone Encounter (Signed)
[name] is stating that since her surgery on 01/25/17 had arteries cleaned out sugars since surgery have been running high  Thursday 09/13 178 fasting am   Friday 09/14 238  fasting am  Saturday 09/15 151 fasting am  Sunday 09/16 134 fasting am  Monday 09/17 132 fasting am  Not on insulin at all shes concerned if she is need insulin or medication at all or should she continue to give it more time and see if it continues to come down, please advise?

## 2017-01-30 NOTE — Telephone Encounter (Signed)
Continue to observe without insulin. However, start monitoring blood glucose 4 times a day-  before meals and at bedtime and report if readings are greater than 2003

## 2017-01-30 NOTE — Telephone Encounter (Signed)
Pt.notified

## 2017-01-31 IMAGING — DX DG CHEST 2V
2 series · 2 of 2 positions shown · non-contrast
Comparison: 12/12/2015

CLINICAL DATA: Fever and fatigue.

EXAM:
CHEST  2 VIEW

[chest pa]
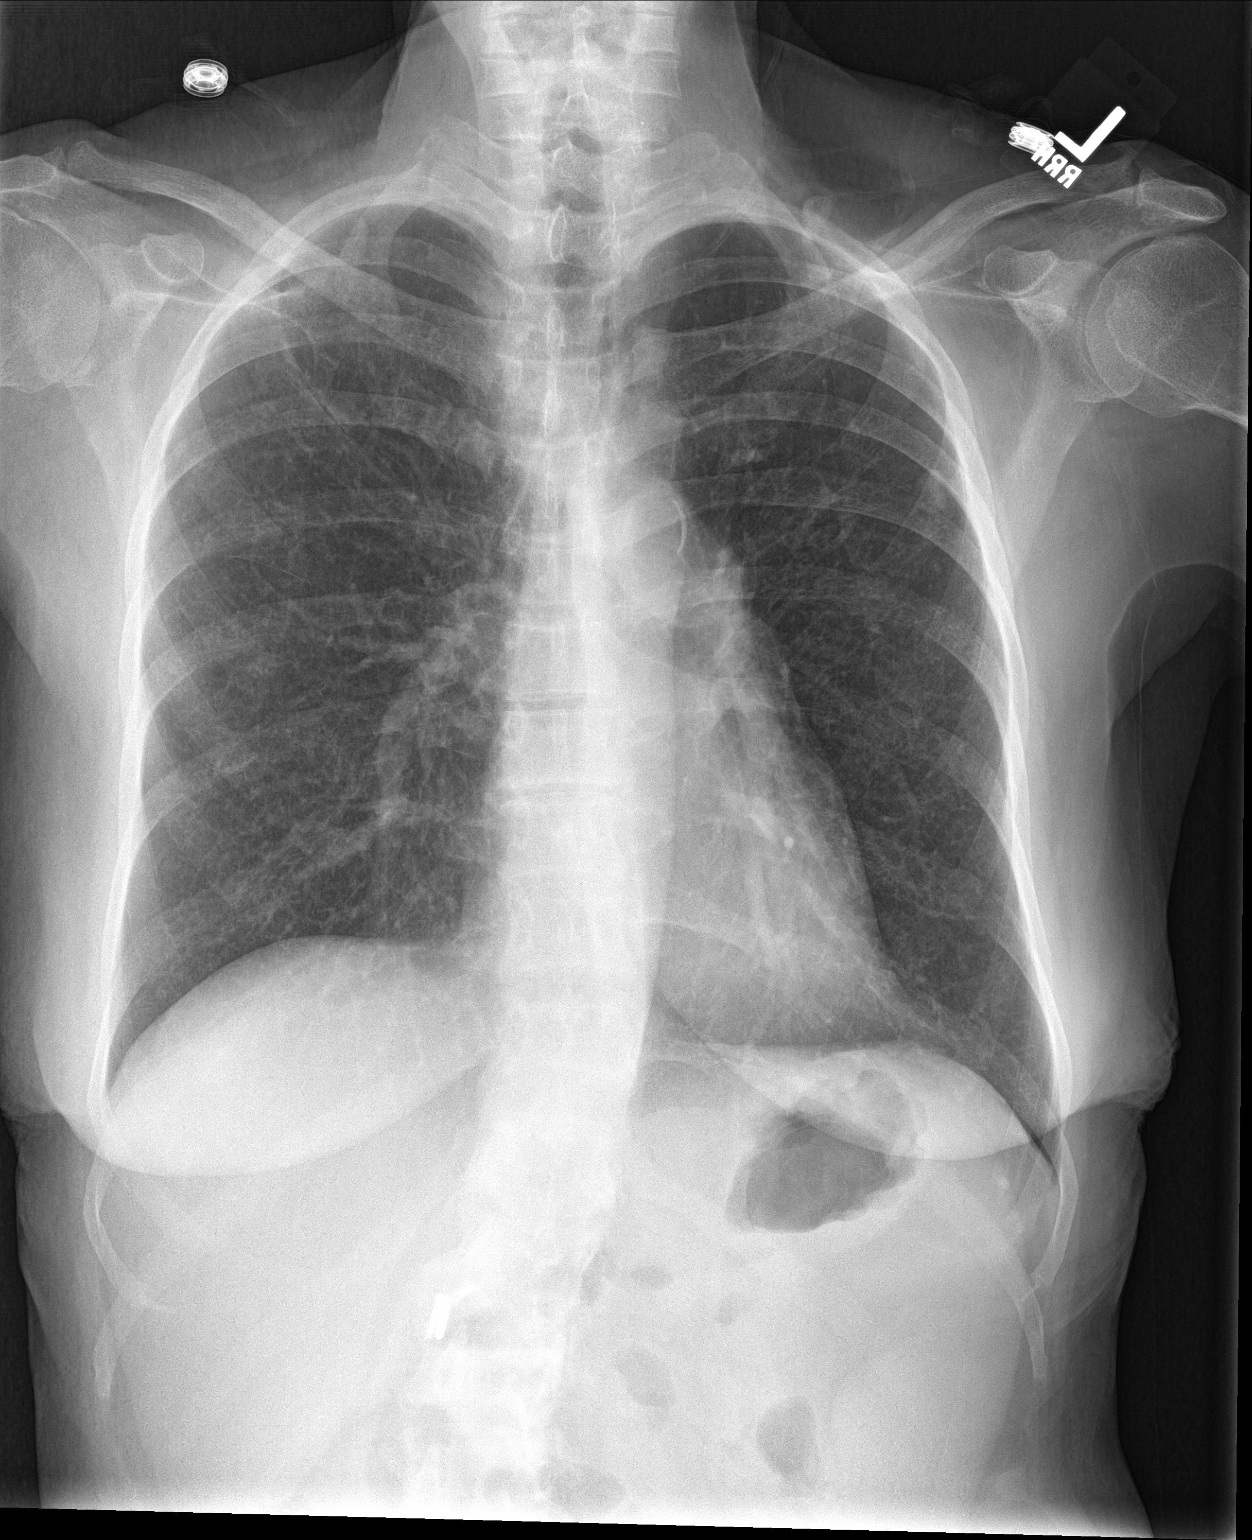

[chest lat]
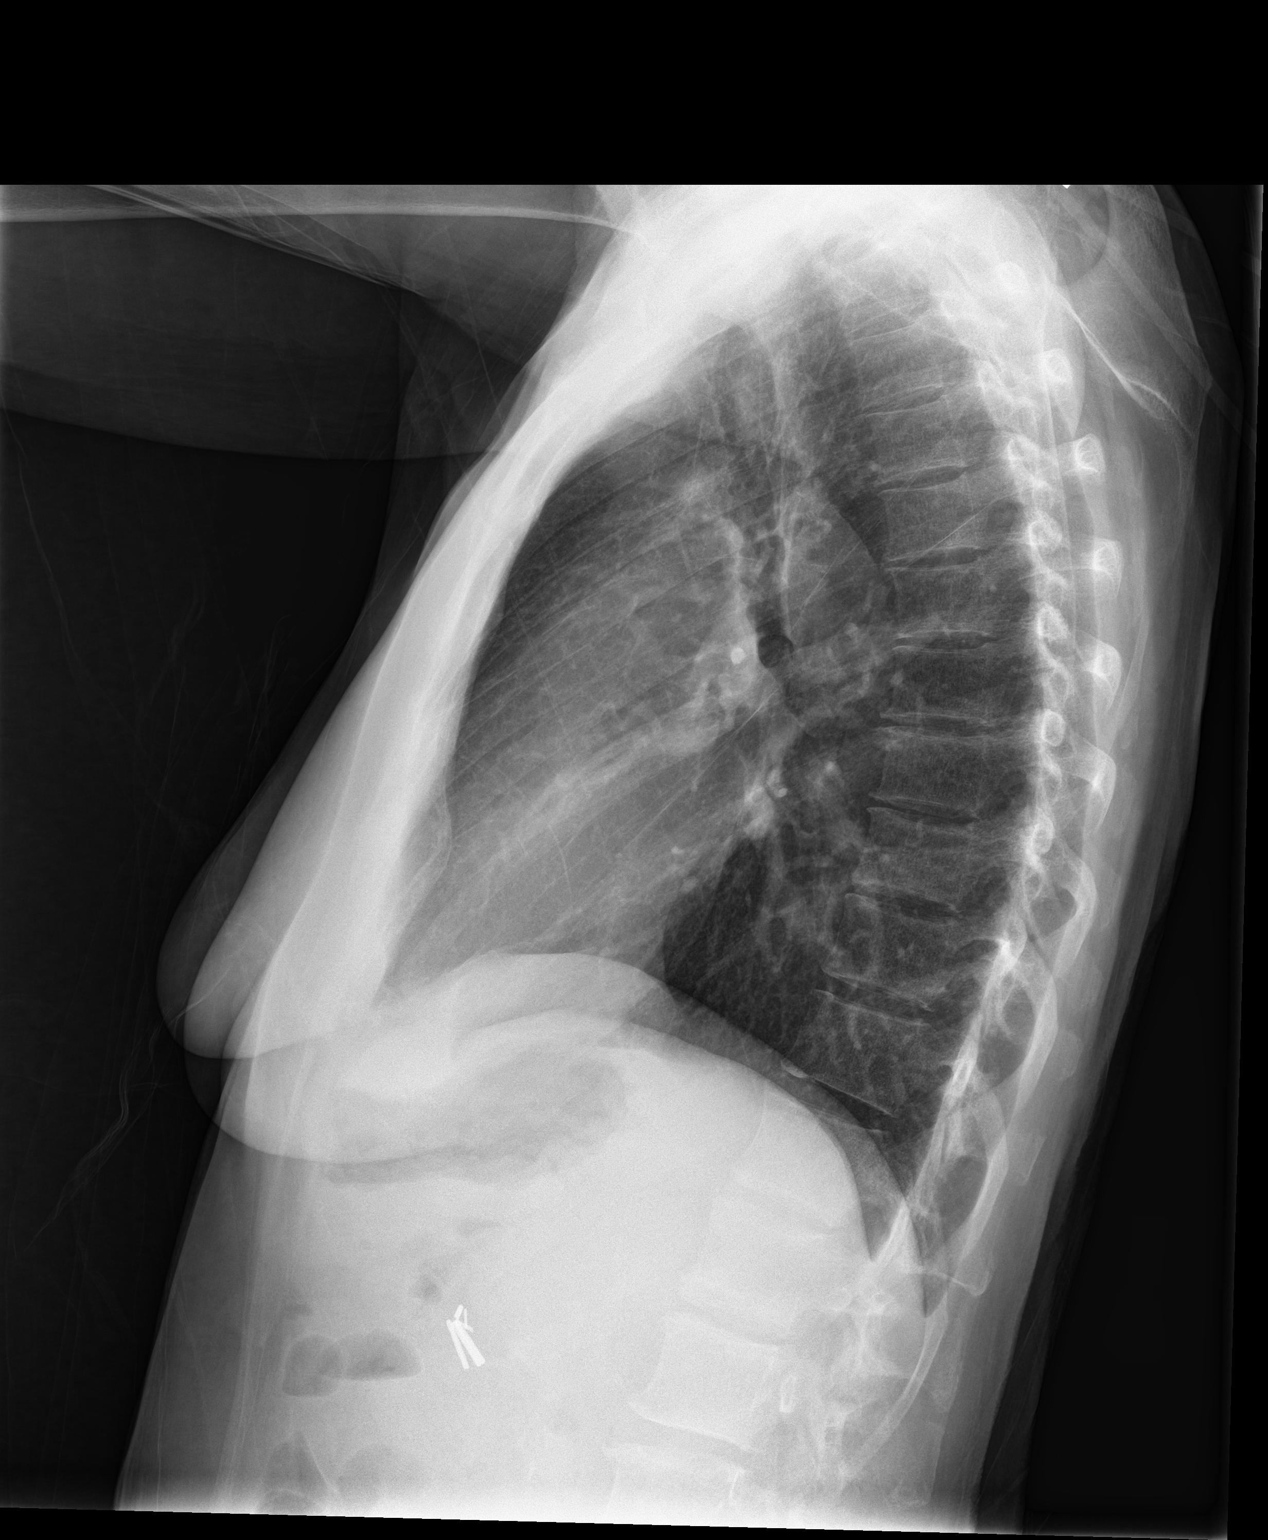

[2 of 2 positions shown; findings below may reference images not displayed]

FINDINGS: The heart size and mediastinal contours are within normal limits.
Calcific atherosclerotic disease of the aortic arch. There is a
stable 10 mm nodular density overlying the superior left hemithorax.
Both lungs are otherwise clear. The visualized skeletal structures
are unremarkable.

Cholecystectomy clips are noted in the upper abdomen.
IMPRESSION: No active cardiopulmonary disease.

Stable 10 mm nodular density in the left upper lung field. Given its
high density, this likely represents a pulmonary granuloma.
Attention on follow-up is recommended.

## 2017-02-01 ENCOUNTER — Encounter (HOSPITAL_COMMUNITY)
Admission: RE | Admit: 2017-02-01 | Discharge: 2017-02-01 | Disposition: A | Discharge status: Home / Self Care | Payer: Medicare Other | Source: Ambulatory Visit | Attending: Nephrology | Admitting: Nephrology

## 2017-02-01 DIAGNOSIS — Z79899 Other long term (current) drug therapy: Secondary | ICD-10-CM | POA: Diagnosis not present

## 2017-02-01 DIAGNOSIS — N183 Chronic kidney disease, stage 3 (moderate): Secondary | ICD-10-CM | POA: Diagnosis present

## 2017-02-01 DIAGNOSIS — D631 Anemia in chronic kidney disease: Secondary | ICD-10-CM | POA: Diagnosis not present

## 2017-02-01 DIAGNOSIS — Z5181 Encounter for therapeutic drug level monitoring: Secondary | ICD-10-CM | POA: Diagnosis not present

## 2017-02-01 LAB — POCT HEMOGLOBIN-HEMACUE: HEMOGLOBIN: 9.2 g/dL — AB (ref 12.0–15.0)

## 2017-02-01 MED ORDER — DARBEPOETIN ALFA 100 MCG/0.5ML IJ SOSY
100.0000 ug | PREFILLED_SYRINGE | Freq: Once | INTRAMUSCULAR | Status: AC
  Administered 2017-02-01: 100 ug via SUBCUTANEOUS

## 2017-02-01 MED ORDER — DARBEPOETIN ALFA 100 MCG/0.5ML IJ SOSY
PREFILLED_SYRINGE | INTRAMUSCULAR | Status: AC
  Filled 2017-02-01: qty 0.5

## 2017-02-06 ENCOUNTER — Emergency Department (HOSPITAL_COMMUNITY): Payer: Medicare Other

## 2017-02-06 ENCOUNTER — Inpatient Hospital Stay (HOSPITAL_COMMUNITY)
Admission: EM | Admit: 2017-02-06 | Discharge: 2017-02-13 | DRG: 388 | Disposition: E | Payer: Medicare Other | Attending: Emergency Medicine | Admitting: Emergency Medicine

## 2017-02-06 ENCOUNTER — Encounter (HOSPITAL_COMMUNITY): Payer: Self-pay | Admitting: Emergency Medicine

## 2017-02-06 DIAGNOSIS — K56609 Unspecified intestinal obstruction, unspecified as to partial versus complete obstruction: Secondary | ICD-10-CM | POA: Diagnosis not present

## 2017-02-06 DIAGNOSIS — Z808 Family history of malignant neoplasm of other organs or systems: Secondary | ICD-10-CM

## 2017-02-06 DIAGNOSIS — D509 Iron deficiency anemia, unspecified: Secondary | ICD-10-CM | POA: Diagnosis present

## 2017-02-06 DIAGNOSIS — Z940 Kidney transplant status: Secondary | ICD-10-CM

## 2017-02-06 DIAGNOSIS — R6521 Severe sepsis with septic shock: Secondary | ICD-10-CM | POA: Diagnosis not present

## 2017-02-06 DIAGNOSIS — Z8701 Personal history of pneumonia (recurrent): Secondary | ICD-10-CM

## 2017-02-06 DIAGNOSIS — E118 Type 2 diabetes mellitus with unspecified complications: Secondary | ICD-10-CM | POA: Diagnosis not present

## 2017-02-06 DIAGNOSIS — Z8349 Family history of other endocrine, nutritional and metabolic diseases: Secondary | ICD-10-CM

## 2017-02-06 DIAGNOSIS — Z91041 Radiographic dye allergy status: Secondary | ICD-10-CM

## 2017-02-06 DIAGNOSIS — R1013 Epigastric pain: Secondary | ICD-10-CM | POA: Diagnosis not present

## 2017-02-06 DIAGNOSIS — Z681 Body mass index (BMI) 19 or less, adult: Secondary | ICD-10-CM

## 2017-02-06 DIAGNOSIS — Z833 Family history of diabetes mellitus: Secondary | ICD-10-CM

## 2017-02-06 DIAGNOSIS — Z87440 Personal history of urinary (tract) infections: Secondary | ICD-10-CM

## 2017-02-06 DIAGNOSIS — E890 Postprocedural hypothyroidism: Secondary | ICD-10-CM | POA: Diagnosis present

## 2017-02-06 DIAGNOSIS — M1990 Unspecified osteoarthritis, unspecified site: Secondary | ICD-10-CM | POA: Diagnosis present

## 2017-02-06 DIAGNOSIS — R54 Age-related physical debility: Secondary | ICD-10-CM | POA: Diagnosis present

## 2017-02-06 DIAGNOSIS — J80 Acute respiratory distress syndrome: Secondary | ICD-10-CM | POA: Diagnosis not present

## 2017-02-06 DIAGNOSIS — E1122 Type 2 diabetes mellitus with diabetic chronic kidney disease: Secondary | ICD-10-CM | POA: Diagnosis present

## 2017-02-06 DIAGNOSIS — E039 Hypothyroidism, unspecified: Secondary | ICD-10-CM | POA: Diagnosis present

## 2017-02-06 DIAGNOSIS — I471 Supraventricular tachycardia: Secondary | ICD-10-CM | POA: Diagnosis present

## 2017-02-06 DIAGNOSIS — N184 Chronic kidney disease, stage 4 (severe): Secondary | ICD-10-CM | POA: Diagnosis present

## 2017-02-06 DIAGNOSIS — Z0189 Encounter for other specified special examinations: Secondary | ICD-10-CM

## 2017-02-06 DIAGNOSIS — I10 Essential (primary) hypertension: Secondary | ICD-10-CM | POA: Diagnosis not present

## 2017-02-06 DIAGNOSIS — N179 Acute kidney failure, unspecified: Secondary | ICD-10-CM | POA: Diagnosis present

## 2017-02-06 DIAGNOSIS — Z885 Allergy status to narcotic agent status: Secondary | ICD-10-CM

## 2017-02-06 DIAGNOSIS — Z888 Allergy status to other drugs, medicaments and biological substances status: Secondary | ICD-10-CM

## 2017-02-06 DIAGNOSIS — A419 Sepsis, unspecified organism: Secondary | ICD-10-CM | POA: Diagnosis not present

## 2017-02-06 DIAGNOSIS — Z7982 Long term (current) use of aspirin: Secondary | ICD-10-CM

## 2017-02-06 DIAGNOSIS — Z79899 Other long term (current) drug therapy: Secondary | ICD-10-CM

## 2017-02-06 DIAGNOSIS — R040 Epistaxis: Secondary | ICD-10-CM | POA: Diagnosis not present

## 2017-02-06 DIAGNOSIS — J9690 Respiratory failure, unspecified, unspecified whether with hypoxia or hypercapnia: Secondary | ICD-10-CM

## 2017-02-06 DIAGNOSIS — G92 Toxic encephalopathy: Secondary | ICD-10-CM | POA: Diagnosis not present

## 2017-02-06 DIAGNOSIS — Z8249 Family history of ischemic heart disease and other diseases of the circulatory system: Secondary | ICD-10-CM

## 2017-02-06 DIAGNOSIS — J690 Pneumonitis due to inhalation of food and vomit: Secondary | ICD-10-CM | POA: Diagnosis not present

## 2017-02-06 DIAGNOSIS — E119 Type 2 diabetes mellitus without complications: Secondary | ICD-10-CM

## 2017-02-06 DIAGNOSIS — K219 Gastro-esophageal reflux disease without esophagitis: Secondary | ICD-10-CM | POA: Diagnosis present

## 2017-02-06 DIAGNOSIS — Z9049 Acquired absence of other specified parts of digestive tract: Secondary | ICD-10-CM

## 2017-02-06 DIAGNOSIS — D62 Acute posthemorrhagic anemia: Secondary | ICD-10-CM | POA: Diagnosis not present

## 2017-02-06 DIAGNOSIS — R579 Shock, unspecified: Secondary | ICD-10-CM

## 2017-02-06 DIAGNOSIS — Z515 Encounter for palliative care: Secondary | ICD-10-CM | POA: Diagnosis not present

## 2017-02-06 DIAGNOSIS — I129 Hypertensive chronic kidney disease with stage 1 through stage 4 chronic kidney disease, or unspecified chronic kidney disease: Secondary | ICD-10-CM | POA: Diagnosis present

## 2017-02-06 DIAGNOSIS — Z66 Do not resuscitate: Secondary | ICD-10-CM | POA: Diagnosis not present

## 2017-02-06 DIAGNOSIS — E785 Hyperlipidemia, unspecified: Secondary | ICD-10-CM | POA: Diagnosis present

## 2017-02-06 DIAGNOSIS — E1151 Type 2 diabetes mellitus with diabetic peripheral angiopathy without gangrene: Secondary | ICD-10-CM | POA: Diagnosis present

## 2017-02-06 DIAGNOSIS — E43 Unspecified severe protein-calorie malnutrition: Secondary | ICD-10-CM | POA: Insufficient documentation

## 2017-02-06 DIAGNOSIS — I21A1 Myocardial infarction type 2: Secondary | ICD-10-CM | POA: Diagnosis not present

## 2017-02-06 DIAGNOSIS — K559 Vascular disorder of intestine, unspecified: Secondary | ICD-10-CM | POA: Diagnosis not present

## 2017-02-06 DIAGNOSIS — E872 Acidosis: Secondary | ICD-10-CM | POA: Diagnosis not present

## 2017-02-06 DIAGNOSIS — Z90710 Acquired absence of both cervix and uterus: Secondary | ICD-10-CM

## 2017-02-06 LAB — I-STAT TROPONIN, ED: TROPONIN I, POC: 0.01 ng/mL (ref 0.00–0.08)

## 2017-02-06 LAB — CBC
HCT: 38.3 % (ref 36.0–46.0)
Hemoglobin: 12.4 g/dL (ref 12.0–15.0)
MCH: 30.7 pg (ref 26.0–34.0)
MCHC: 32.4 g/dL (ref 30.0–36.0)
MCV: 94.8 fL (ref 78.0–100.0)
PLATELETS: 180 10*3/uL (ref 150–400)
RBC: 4.04 MIL/uL (ref 3.87–5.11)
RDW: 14.6 % (ref 11.5–15.5)
WBC: 4.1 10*3/uL (ref 4.0–10.5)

## 2017-02-06 LAB — COMPREHENSIVE METABOLIC PANEL
ALK PHOS: 138 U/L — AB (ref 38–126)
ALT: 13 U/L — AB (ref 14–54)
AST: 26 U/L (ref 15–41)
Albumin: 4.2 g/dL (ref 3.5–5.0)
Anion gap: 11 (ref 5–15)
BILIRUBIN TOTAL: 0.7 mg/dL (ref 0.3–1.2)
BUN: 26 mg/dL — AB (ref 6–20)
CALCIUM: 8.6 mg/dL — AB (ref 8.9–10.3)
CO2: 25 mmol/L (ref 22–32)
CREATININE: 2.5 mg/dL — AB (ref 0.44–1.00)
Chloride: 102 mmol/L (ref 101–111)
GFR, EST AFRICAN AMERICAN: 21 mL/min — AB (ref >60)
GFR, EST NON AFRICAN AMERICAN: 18 mL/min — AB (ref >60)
Glucose, Bld: 193 mg/dL — ABNORMAL HIGH (ref 65–99)
Potassium: 4.3 mmol/L (ref 3.5–5.1)
Sodium: 138 mmol/L (ref 135–145)
TOTAL PROTEIN: 6.3 g/dL — AB (ref 6.5–8.1)

## 2017-02-06 LAB — URINALYSIS, ROUTINE W REFLEX MICROSCOPIC
Bacteria, UA: NONE SEEN
Bilirubin Urine: NEGATIVE
GLUCOSE, UA: NEGATIVE mg/dL
HGB URINE DIPSTICK: NEGATIVE
KETONES UR: NEGATIVE mg/dL
Nitrite: NEGATIVE
PH: 6 (ref 5.0–8.0)
Specific Gravity, Urine: 1.013 (ref 1.005–1.030)

## 2017-02-06 LAB — LIPASE, BLOOD: LIPASE: 22 U/L (ref 11–51)

## 2017-02-06 MED ORDER — ONDANSETRON 4 MG PO TBDP
4.0000 mg | ORAL_TABLET | Freq: Once | ORAL | Status: AC | PRN
  Administered 2017-02-06: 4 mg via ORAL

## 2017-02-06 MED ORDER — SODIUM CHLORIDE 0.9 % IV BOLUS (SEPSIS)
1000.0000 mL | Freq: Once | INTRAVENOUS | Status: AC
  Administered 2017-02-06: 1000 mL via INTRAVENOUS

## 2017-02-06 MED ORDER — PROMETHAZINE HCL 25 MG PO TABS: 25.0000 mg | ORAL_TABLET | Freq: Four times a day (QID) | ORAL | Status: DC | PRN

## 2017-02-06 MED ORDER — SODIUM CHLORIDE 0.9 % IV SOLN
INTRAVENOUS | Status: DC
  Administered 2017-02-06 – 2017-02-07 (×2): via INTRAVENOUS

## 2017-02-06 MED ORDER — TACROLIMUS 1 MG PO CAPS: 1.0000 mg | ORAL_CAPSULE | Freq: Every day | ORAL | Status: DC

## 2017-02-06 MED ORDER — PROMETHAZINE HCL 25 MG RE SUPP
25.0000 mg | Freq: Four times a day (QID) | RECTAL | Status: DC | PRN
  Filled 2017-02-06: qty 1

## 2017-02-06 MED ORDER — CLONAZEPAM 1 MG PO TABS
1.0000 mg | ORAL_TABLET | Freq: Every day | ORAL | Status: DC
  Administered 2017-02-06 – 2017-02-07 (×2): 1 mg via ORAL
  Filled 2017-02-06: qty 2
  Filled 2017-02-06: qty 1

## 2017-02-06 MED ORDER — PROMETHAZINE HCL 25 MG/ML IJ SOLN
12.5000 mg | Freq: Four times a day (QID) | INTRAMUSCULAR | Status: DC | PRN
  Administered 2017-02-06 – 2017-02-08 (×3): 12.5 mg via INTRAVENOUS
  Filled 2017-02-06 (×3): qty 1

## 2017-02-06 MED ORDER — BARIUM SULFATE 2.1 % PO SUSP
ORAL | Status: AC
  Filled 2017-02-06: qty 2

## 2017-02-06 MED ORDER — MYCOPHENOLATE MOFETIL 250 MG PO CAPS
250.0000 mg | ORAL_CAPSULE | Freq: Two times a day (BID) | ORAL | Status: DC
  Administered 2017-02-06 – 2017-02-07 (×2): 250 mg via ORAL
  Filled 2017-02-06 (×2): qty 1

## 2017-02-06 MED ORDER — ONDANSETRON HCL 4 MG/2ML IJ SOLN
4.0000 mg | Freq: Four times a day (QID) | INTRAMUSCULAR | Status: DC | PRN
  Administered 2017-02-07 (×2): 4 mg via INTRAVENOUS
  Filled 2017-02-06 (×3): qty 2

## 2017-02-06 MED ORDER — TACROLIMUS 1 MG PO CAPS
1.0000 mg | ORAL_CAPSULE | Freq: Every day | ORAL | Status: DC
  Filled 2017-02-06 (×2): qty 1

## 2017-02-06 MED ORDER — ONDANSETRON HCL 4 MG/2ML IJ SOLN
4.0000 mg | Freq: Once | INTRAMUSCULAR | Status: AC
  Administered 2017-02-06: 4 mg via INTRAVENOUS
  Filled 2017-02-06: qty 2

## 2017-02-06 MED ORDER — ONDANSETRON 4 MG PO TBDP
ORAL_TABLET | ORAL | Status: AC
  Filled 2017-02-06: qty 1

## 2017-02-06 NOTE — Consult Note (Signed)
Reason for Consult:SBO Referring Physician: DR. Clement Maddox is an 76 y.o. female.  HPI: This is a 76 year old female with multiple medical problems and a history of multiple surgeries including an appendectomy, hysterectomy, and cholecystectomy on her abdomen who presents with nausea, vomiting, abdominal pain over 2 to 3days.  She is approximately 12 days status post a right carotid endarterectomy.  She was initially doing well postoperatively but then started having progressive upper abdominal pain which she described as crampy in nature with nausea. It then progressed to multiple episodes of emesis. Her last bowel movement was yesterday. She has been in the emergency department since approximate 9 AM and has not passed any flatus. Her last episode of emesis was several hours ago. An NG tube has been held. She had a CT scan of the abdomen showing findings consistent with a small bowel obstruction. She has no previous history of bowel obstructions.  Past Medical History:  Diagnosis Date  . Arthritis   . Carotid artery stenosis   . Carotid artery stenosis    Right  . Chronic kidney disease    Kidney transplant  15 years ago  . Essential hypertension   . Frequent UTI   . GERD (gastroesophageal reflux disease)   . History of blood transfusion   . History of pneumonia   . History of renal transplantation    Follows with Dr. Mercy Maddox  . Hyperlipemia   . Hypothyroidism   . Iron deficiency anemia   . 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 Shingles   . Type II diabetes mellitus (H[facility])    no medications    Past Surgical History:  Procedure Laterality Date  . ABDOMINAL HYSTERECTOMY    . APPENDECTOMY    . BACK SURGERY    . CHOLECYSTECTOMY OPEN    . ENDARTERECTOMY Right 01/25/2017   Procedure: ENDARTERECTOMY CAROTID-RIGHT;  Surgeon: Deanna Mitchell, MD;  Location: [facility] OR;  Service: Vascular;  Laterality: Right;  . KIDNEY SURGERY     multiple  . KIDNEY  TRANSPLANT Right 2004   at [facility]  . LUMBAR DISC SURGERY     "L5"  . THYROIDECTOMY     1/2 removed  . TONSILLECTOMY    . TUBAL LIGATION      Family History  Problem Relation Age of Onset  . Diabetes Mother   . Hypertension Mother   . Melanoma Mother   . Hyperlipidemia Mother   . Heart disease Father   . Varicose Veins Father   . Hypertension Sister   . Cancer Daughter   . Hypertension Son   . Hyperlipidemia Sister   . Hyperlipidemia Daughter   . Hyperlipidemia Son   . Hypertension Daughter     Social History:  reports that she has never smoked. She has never used smokeless tobacco. She reports that she does not drink alcohol or use drugs.  Allergies:  Allergies  Allergen Reactions  . Codeine Nausea And Vomiting  . Elavil [Amitriptyline] Other (See Comments)    "makes legs hurt"  . Iodinated Diagnostic Agents Other (See Comments)    Renal transplant pt  . Lipitor [Atorvastatin] Other (See Comments)    Muscle pain   . Magnesium Hives    Pt tolerated oral and IV during 06/2014 admission   . Metoclopramide Other (See Comments)    Other reaction(s): Other (See Comments) "doesn't work"  . Morphine And Related Other (See Comments)    hallucinations   .  Neurontin [Gabapentin] Other (See Comments)    Muscle pain   . Niacin And Related Other (See Comments)    Muscle pain   . Norvasc [Amlodipine Besylate] Other (See Comments)    Muscle pain  . Talwin [Pentazocine] Nausea Only and Other (See Comments)    hallucinations  . Ultram [Tramadol] Itching    Medications: I have reviewed the patient's current medications.  Results for orders placed or performed during the hospital encounter of 02/03/2017 (from the past 48 hour(s))  Lipase, blood     Status: None   Collection Time: 01/31/2017 10:30 AM  Result Value Ref Range   Lipase 22 11 - 51 U/L  Comprehensive metabolic panel     Status: Abnormal   Collection Time: 01/24/2017 10:30 AM  Result Value Ref Range   Sodium 138 135  - 145 mmol/L   Potassium 4.3 3.5 - 5.1 mmol/L   Chloride 102 101 - 111 mmol/L   CO2 25 22 - 32 mmol/L   Glucose, Bld 193 (H) 65 - 99 mg/dL   BUN 26 (H) 6 - 20 mg/dL   Creatinine, Ser 2.50 (H) 0.44 - 1.00 mg/dL   Calcium 8.6 (L) 8.9 - 10.3 mg/dL   Total Protein 6.3 (L) 6.5 - 8.1 g/dL   Albumin 4.2 3.5 - 5.0 g/dL   AST 26 15 - 41 U/L   ALT 13 (L) 14 - 54 U/L   Alkaline Phosphatase 138 (H) 38 - 126 U/L   Total Bilirubin 0.7 0.3 - 1.2 mg/dL   GFR calc non Af Amer 18 (L) >60 mL/min   GFR calc Af Amer 21 (L) >60 mL/min    Comment: (NOTE) The eGFR has been calculated using the CKD EPI equation. This calculation has not been validated in all clinical situations. eGFR's persistently <60 mL/min signify possible Chronic Kidney Disease.    Anion gap 11 5 - 15  CBC     Status: None   Collection Time: 02/04/2017 10:30 AM  Result Value Ref Range   WBC 4.1 4.0 - 10.5 K/uL   RBC 4.04 3.87 - 5.11 MIL/uL   Hemoglobin 12.4 12.0 - 15.0 g/dL   HCT 38.3 36.0 - 46.0 %   MCV 94.8 78.0 - 100.0 fL   MCH 30.7 26.0 - 34.0 pg   MCHC 32.4 30.0 - 36.0 g/dL   RDW 14.6 11.5 - 15.5 %   Platelets 180 150 - 400 K/uL  Urinalysis, Routine w reflex microscopic     Status: Abnormal   Collection Time: 01/21/2017  2:35 PM  Result Value Ref Range   Color, Urine YELLOW YELLOW   APPearance CLEAR CLEAR   Specific Gravity, Urine 1.013 1.005 - 1.030   pH 6.0 5.0 - 8.0   Glucose, UA NEGATIVE NEGATIVE mg/dL   Hgb urine dipstick NEGATIVE NEGATIVE   Bilirubin Urine NEGATIVE NEGATIVE   Ketones, ur NEGATIVE NEGATIVE mg/dL   Protein, ur >=300 (A) NEGATIVE mg/dL   Nitrite NEGATIVE NEGATIVE   Leukocytes, UA TRACE (A) NEGATIVE   RBC / HPF 0-5 0 - 5 RBC/hpf   WBC, UA 6-30 0 - 5 WBC/hpf   Bacteria, UA NONE SEEN NONE SEEN   Squamous Epithelial / LPF 0-5 (A) NONE SEEN  I-stat troponin, ED     Status: None   Collection Time: 01/28/2017  3:50 PM  Result Value Ref Range   Troponin i, poc 0.01 0.00 - 0.08 ng/mL   Comment 3  Comment: Due to the release kinetics of cTnI, a negative result within the first hours of the onset of symptoms does not rule out myocardial infarction with certainty. If myocardial infarction is still suspected, repeat the test at appropriate intervals.     Ct Abdomen Pelvis Wo Contrast  Result Date: 01/15/2017 CLINICAL DATA:  Generalize weakness with nausea vomiting and abdominal pain EXAM: CT ABDOMEN AND PELVIS WITHOUT CONTRAST TECHNIQUE: Multidetector CT imaging of the abdomen and pelvis was performed following the standard protocol without IV contrast. COMPARISON:  12/15/2015 FINDINGS: Lower chest: Lung bases demonstrate no acute consolidation or pleural effusion. Normal heart size. Hepatobiliary: No focal liver abnormality is seen. Status post cholecystectomy. No biliary dilatation. Pancreas: Unremarkable. No pancreatic ductal dilatation or surrounding inflammatory changes. Spleen: Normal in size without focal abnormality. Adrenals/Urinary Tract: Adrenal glands are within normal limits. Marked atrophy of the native kidneys. Negative for hydronephrosis. Scattered calcifications in the left kidney. Stable 3.5 cm cyst lower pole right kidney. Right lower quadrant transplanted kidney with embolization coils. Urinary bladder unremarkable Stomach/Bowel: Moderate enlargement of the stomach. Dilated loops of jejunum in the left upper quadrant, measuring up to 3.7 cm. Transition point visualized within the mid small bowel in the upper pelvis, just to the right of midline, series 3, image number 48. No significant bowel wall thickening. Distal small bowel and colon are collapsed. Vascular/Lymphatic: Aortic atherosclerosis. No enlarged abdominal or pelvic lymph nodes. Reproductive: Status post hysterectomy. No adnexal masses. Other: Negative for free air. Small amount of free fluid in the pelvis an adjacent to the liver Musculoskeletal: Degenerative changes. No acute or suspicious bone lesion. IMPRESSION:  1. Multiple loops of dilated small bowel, mostly on the left side of the abdomen with transition point visualized in the proximal to mid small bowel, in the upper pelvis just to the right of midline ; the small bowel loops and colon distal to this are decompressed. Findings consistent with mechanical small bowel obstruction possibly due to adhesions or internal hernia. 2. Small amount of free fluid in the abdomen and pelvis 3. Atrophy of the native kidneys. Right lower quadrant transplanted kidney. Electronically Signed   By: Donavan Foil M.D.   On: 01/14/2017 19:01   Dg Chest 2 View  Result Date: 02/04/2017 CLINICAL DATA:  Nausea and vomiting EXAM: CHEST  2 VIEW COMPARISON:  07/08/2016 FINDINGS: Normal heart size and stable mediastinal contours. There is no edema, consolidation, effusion, or pneumothorax. Linear opacities in the right upper lung, stable compared to priors, especially 12/12/2015, compatible with scarring. Stable nodular density overlapping the anterior left third rib. No acute osseous finding. Surgical clips at the thoracic inlet correlating with history of thyroidectomy. Distended loops of small bowel in the left upper quadrant, up to 4 cm in diameter. IMPRESSION: 1. No evidence of acute cardiopulmonary disease. Stable chest compared to prior. 2. Distended loops of bowel in the left upper quadrant, consider further abdominal imaging in this clinical setting. Electronically Signed   By: Monte Fantasia M.D.   On: 01/24/2017 15:27    Review of Systems  Respiratory: Negative for cough and shortness of breath.   Cardiovascular: Negative for chest pain.  Gastrointestinal: Positive for abdominal pain, nausea and vomiting.  Genitourinary: Negative for dysuria.  All other systems reviewed and are negative.  Blood pressure (!) 164/80, pulse 99, temperature 98.4 F (36.9 C), temperature source Oral, resp. rate 11, SpO2 99 %. Physical Exam  Constitutional: She is oriented to person, place,  and time. She appears well-developed  and well-nourished. No distress.  HENT:  Head: Normocephalic and atraumatic.  Right Ear: External ear normal.  Left Ear: External ear normal.  Nose: Nose normal.  Mouth/Throat: Oropharynx is clear and moist.  Eyes: Pupils are equal, round, and reactive to light. Right eye exhibits no discharge. Left eye exhibits no discharge. No scleral icterus.  Neck: Neck supple. No tracheal deviation present.  Right neck incision healing well  Cardiovascular: Normal rate, regular rhythm and normal heart sounds.   No murmur heard. Respiratory: Effort normal and breath sounds normal. No respiratory distress.  GI: Soft.  She has multiple well-healed abdominal incisions. There are no hernias. She is minimally tender and not particularly distended  Musculoskeletal: Normal range of motion. She exhibits no edema or deformity.  Neurological: She is alert and oriented to person, place, and time.  Skin: Skin is warm and dry. She is not diaphoretic.  Psychiatric: Her behavior is normal. Judgment normal.    Assessment/Plan: Small bowel obstruction  She and her family are concerned about her renal transplant anterior issues with contrast. They would like to hold on NG insertion. I discussed the diagnosis with them. I discussed conservative treatment of small bowel obstructions. I also discussed further x-ray studies including our small bowel protocol. Currently, we will hold on NG in the she starts to have more emesis or worse pain. If we had to place it to, we will then order the small bowel protocol. She will remain nothing by mouth and we will repeat her x-rays in the morning  [name] A 02/09/2017, 9:33 PM

## 2017-02-06 NOTE — ED Provider Notes (Signed)
Patient visit shared.  Patient with history of renal transplant here with nausea and vomiting. Plain films demonstrates that the loops of bowel. She has been given IV fluids and CT abdomen was obtained to further evaluate. CT abdomen demonstrates about structural transition points. Discussed with Dr. Magnus Ivan with general surgery. Recommend medicine admit and he will see in consult. He recommended NG tube if she has persistent vomiting. Hospitalist consulted for admission for further treatment.   Tilden Fossa, MD 02/12/2017 [phone]

## 2017-02-06 NOTE — ED Notes (Signed)
Pt's family provided with blankets and recliner for comfort.

## 2017-02-06 NOTE — H&P (Signed)
History and Physical  Patient Name: [name]     HBZ:[id number]    DOB: [birth year]    DOA: 02/10/2017 PCP: [name], DO  Nephrology: Dr. Mercy Moore Cardiology: Dr. Domenic Polite  Patient coming from: Home  Chief Complaint: Abdominal pain, vomiting      HPI: [name] is a 76 y.o. 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 Trula Slade who presents with abdominal pain and vomiting.  She underwent a recent right CEA by Dr. Trula Slade, uneventful postoperative course. She was home with her daughter for about a week, was eating and drinking and ambulating well, so about 4 days ago went back home where she lives by herself.   Then in the last 3 days, she has had gradually progressive upper abdominal pain that was sharp and colicky, associated with some nausea. This became progressively worse until today it was severe, she was vomiting all day, nonbloody nonbilious vomit, and finally came to the emergency room.  ED course: -Afebrile, heart rate 90, respirations pulse ox normal, blood pressure 140/100 -Na 138, K 4.3, Cr 2.5 (baseline 1.8), WBC 4.1K, Hgb 12.4 -Lipase WNL -UA showed 6-30 WBCs -CXR showed no airspace disease, but did show SB dilation -CT abdomen and pelvis showed SBO -She was given 1L fluids, ondansetron and Dr. Ninfa Linden was consulted    She doesn't remember having an SBO in the past, but she does remember having a ng tube in the context of her renal failure.     ROS: Review of Systems  Constitutional: Positive for malaise/fatigue. Negative for chills and fever.  Gastrointestinal: Positive for abdominal pain, nausea and vomiting. Negative for blood in stool, constipation, diarrhea and melena.  Neurological: Positive for tremors and weakness.  All other systems reviewed and are negative.         Past Medical History:  Diagnosis Date  . Arthritis     . Carotid artery stenosis   . Carotid artery stenosis    Right  . Chronic kidney disease    Kidney transplant  15 years ago  . Essential hypertension   . Frequent UTI   . GERD (gastroesophageal reflux disease)   . History of blood transfusion   . History of pneumonia   . History of renal transplantation    Follows with Dr. Mercy Moore  . Hyperlipemia   . Hypothyroidism   . Iron deficiency anemia   . 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 Shingles   . Type II diabetes mellitus ([facility])    no medications    Past Surgical History:  Procedure Laterality Date  . ABDOMINAL HYSTERECTOMY    . APPENDECTOMY    . BACK SURGERY    . CHOLECYSTECTOMY OPEN    . ENDARTERECTOMY Right 01/25/2017   Procedure: ENDARTERECTOMY CAROTID-RIGHT;  Surgeon: Serafina Mitchell, MD;  Location: [facility] OR;  Service: Vascular;  Laterality: Right;  . KIDNEY SURGERY     multiple  . KIDNEY TRANSPLANT Right 2004   at [facility]  . LUMBAR DISC SURGERY     "L5"  . THYROIDECTOMY     1/2 removed  . TONSILLECTOMY    . TUBAL LIGATION      Social History: Patient lives alone.  The patient walks unassisted.  She still drives.  Nonsmoker.  No alcohol.  Worked as a Mudlogger, now retired. Independent with all ADLs, IADLs.  Allergies  Allergen Reactions  . Codeine Nausea And Vomiting  . Elavil [Amitriptyline] Other (See Comments)    "makes legs hurt"  . Iodinated Diagnostic Agents Other (See Comments)    Renal transplant pt  . Lipitor [Atorvastatin] Other (See Comments)    Muscle pain   . Magnesium Hives    Pt tolerated oral and IV during 06/2014 admission   . Metoclopramide Other (See Comments)    Other reaction(s): Other (See Comments) "doesn't work"  . Morphine And Related Other (See Comments)    hallucinations   . Neurontin [Gabapentin] Other (See Comments)    Muscle pain   . Niacin And Related Other (See Comments)    Muscle pain   . Norvasc [Amlodipine Besylate] Other (See  Comments)    Muscle pain  . Talwin [Pentazocine] Nausea Only and Other (See Comments)    hallucinations  . Ultram [Tramadol] Itching    Family history: family history includes Cancer in her daughter; Diabetes in her mother; Heart disease in her father; Hyperlipidemia in her daughter, mother, sister, and son; Hypertension in her daughter, mother, sister, and son; Melanoma in her mother; Varicose Veins in her father.  Prior to Admission medications   Medication Sig Start Date End Date Taking? Authorizing Provider  aspirin 81 MG tablet Take 81 mg by mouth at bedtime.    Yes [provider]  Blood Glucose Monitoring Suppl (ACCU-CHEK AVIVA PLUS) w/Device KIT Use as directed 2 x daily 06/19/15  Yes Nida, Marella Chimes, MD  CELLCEPT 250 MG capsule Take 250 mg by mouth 2 (two) times daily. AT 0800 AND AT 2000 01/09/17  Yes [provider]  clonazePAM (KLONOPIN) 1 MG tablet Take 1 mg by mouth at bedtime.   Yes [provider]  Darbepoetin Alfa (ARANESP, ALBUMIN FREE,) 100 MCG/0.5ML SOSY injection Inject 100 mcg into the skin every 14 (fourteen) days. Last dose 01-18-17   Yes [provider]  dicyclomine (BENTYL) 10 MG capsule Take 10 mg by mouth 3 (three) times daily as needed for spasms.    Yes [provider]  diphenoxylate-atropine (LOMOTIL) 2.5-0.025 MG tablet Take 1 tablet by mouth 4 (four) times daily as needed for diarrhea or loose stools.   Yes [provider]  fluconazole (DIFLUCAN) 200 MG tablet Take 200 mg by mouth daily.  01/02/17  Yes [provider]  glucose blood (ACCU-CHEK AVIVA) test strip Use as instructed bid 06/29/15  Yes Nida, Marella Chimes, MD  levothyroxine (SYNTHROID, LEVOTHROID) 75 MCG tablet Take 1 tablet (75 mcg total) by mouth daily before breakfast. 12/20/16  Yes Nida, Marella Chimes, MD  magnesium oxide (MAG-OX) 400 MG tablet Take 400 mg by mouth daily at 12 noon.    Yes [provider]  metoprolol  (LOPRESSOR) 50 MG tablet Take 50 mg by mouth 2 (two) times daily.   Yes [provider]  oxyCODONE (OXY IR/ROXICODONE) 5 MG immediate release tablet Take 1 tablet (5 mg total) by mouth every 6 (six) hours as needed for moderate pain. 01/26/17  Yes Rhyne, Samantha J, PA-C  pantoprazole (PROTONIX) 40 MG tablet Take 40 mg by mouth 2 (two) times daily.  10/11/14  Yes [provider]  sodium bicarbonate 650 MG tablet Take 2 tablets (1,300 mg total) by mouth daily at 12 noon. 01/26/17  Yes Rhyne, Hulen Shouts, PA-C  tacrolimus (PROGRAF) 0.5 MG capsule Take 1 mg by mouth daily.    Yes [provider]       Physical Exam: BP Marland Kitchen)  164/80   Pulse 99   Temp 98.4 F (36.9 C) (Oral)   Resp 11   SpO2 99%  General appearance: Frail, elderly adult female, alert and in moderate distress from nausea.   Eyes: Anicteric, conjunctiva pink, lids and lashes normal. PERRL.    ENT: No nasal deformity, discharge, epistaxis.  Hearing normal. OP moist without lesions.   Neck: No neck masses.  Trachea midline.  No thyromegaly/tenderness.  Large right CEA scar, appears well healing. Lymph: No cervical or supraclavicular lymphadenopathy. Skin: Warm and dry.  No jaundice.  No suspicious rashes or lesions. Cardiac: RRR, nl S1-S2, no murmurs appreciated.  Capillary refill is brisk.  JVP normale.  No LE edema.  Radial and DP pulses 2+ and symmetric. Respiratory: Normal respiratory rate and rhythm.  CTAB without rales or wheezes. Abdomen: Abdomen soft.  Gaurding present.  Mild diffuse, mostly upper TTP, without rebound, rigidity. No ascites, hepatosplenomegaly.  Mildly distended. MSK: No deformities or effusions.  No cyanosis or clubbing. Neuro: Cranial nerves normal.  Sensation intact to light touch. Speech is fluent.  Muscle strength 4+/5, symmetric.    Psych: Sensorium intact and responding to questions, attention normal.  Behavior appropriate.  Affect blunted by nausea.  Judgment and insight appear  normal.     Labs on Admission:  I have personally reviewed following labs and imaging studies: CBC:  Recent Labs Lab 02/01/17 1013 02/10/2017 1030  WBC  --  4.1  HGB 9.2* 12.4  HCT  --  38.3  MCV  --  94.8  PLT  --  643   Basic Metabolic Panel:  Recent Labs Lab 02/10/2017 1030  NA 138  K 4.3  CL 102  CO2 25  GLUCOSE 193*  BUN 26*  CREATININE 2.50*  CALCIUM 8.6*   GFR: Estimated Creatinine Clearance: 12.7 mL/min (A) (by C-G formula based on SCr of 2.5 mg/dL (H)).  Liver Function Tests:  Recent Labs Lab 02/05/2017 1030  AST 26  ALT 13*  ALKPHOS 138*  BILITOT 0.7  PROT 6.3*  ALBUMIN 4.2    Recent Labs Lab 01/23/2017 1030  LIPASE 22   No results for input(s): AMMONIA in the last 168 hours. Coagulation Profile: No results for input(s): INR, PROTIME in the last 168 hours. Cardiac Enzymes: No results for input(s): CKTOTAL, CKMB, CKMBINDEX, TROPONINI in the last 168 hours. BNP (last 3 results) No results for input(s): PROBNP in the last 8760 hours. HbA1C: No results for input(s): HGBA1C in the last 72 hours. CBG: No results for input(s): GLUCAP in the last 168 hours. Lipid Profile: No results for input(s): CHOL, HDL, LDLCALC, TRIG, CHOLHDL, LDLDIRECT in the last 72 hours. Thyroid Function Tests: No results for input(s): TSH, T4TOTAL, FREET4, T3FREE, THYROIDAB in the last 72 hours. Anemia Panel: No results for input(s): VITAMINB12, FOLATE, FERRITIN, TIBC, IRON, RETICCTPCT in the last 72 hours. Sepsis Labs:  Invalid input(s): PROCALCITONIN, LACTICIDVEN No results found for this or any previous visit (from the past 240 hour(s)).       Radiological Exams on Admission: Personally reviewed CXR shows no airspace disease, CT abdomen report reviewed: Ct Abdomen Pelvis Wo Contrast  Result Date: 02/09/2017 CLINICAL DATA:  Generalize weakness with nausea vomiting and abdominal pain EXAM: CT ABDOMEN AND PELVIS WITHOUT CONTRAST TECHNIQUE: Multidetector CT imaging of  the abdomen and pelvis was performed following the standard protocol without IV contrast. COMPARISON:  12/15/2015 FINDINGS: Lower chest: Lung bases demonstrate no acute consolidation or pleural effusion. Normal heart size. Hepatobiliary: No focal liver  abnormality is seen. Status post cholecystectomy. No biliary dilatation. Pancreas: Unremarkable. No pancreatic ductal dilatation or surrounding inflammatory changes. Spleen: Normal in size without focal abnormality. Adrenals/Urinary Tract: Adrenal glands are within normal limits. Marked atrophy of the native kidneys. Negative for hydronephrosis. Scattered calcifications in the left kidney. Stable 3.5 cm cyst lower pole right kidney. Right lower quadrant transplanted kidney with embolization coils. Urinary bladder unremarkable Stomach/Bowel: Moderate enlargement of the stomach. Dilated loops of jejunum in the left upper quadrant, measuring up to 3.7 cm. Transition point visualized within the mid small bowel in the upper pelvis, just to the right of midline, series 3, image number 48. No significant bowel wall thickening. Distal small bowel and colon are collapsed. Vascular/Lymphatic: Aortic atherosclerosis. No enlarged abdominal or pelvic lymph nodes. Reproductive: Status post hysterectomy. No adnexal masses. Other: Negative for free air. Small amount of free fluid in the pelvis an adjacent to the liver Musculoskeletal: Degenerative changes. No acute or suspicious bone lesion. IMPRESSION: 1. Multiple loops of dilated small bowel, mostly on the left side of the abdomen with transition point visualized in the proximal to mid small bowel, in the upper pelvis just to the right of midline ; the small bowel loops and colon distal to this are decompressed. Findings consistent with mechanical small bowel obstruction possibly due to adhesions or internal hernia. 2. Small amount of free fluid in the abdomen and pelvis 3. Atrophy of the native kidneys. Right lower quadrant  transplanted kidney. Electronically Signed   By: Donavan Foil M.D.   On: 01/30/2017 19:01   Dg Chest 2 View  Result Date: 02/03/2017 CLINICAL DATA:  Nausea and vomiting EXAM: CHEST  2 VIEW COMPARISON:  07/08/2016 FINDINGS: Normal heart size and stable mediastinal contours. There is no edema, consolidation, effusion, or pneumothorax. Linear opacities in the right upper lung, stable compared to priors, especially 12/12/2015, compatible with scarring. Stable nodular density overlapping the anterior left third rib. No acute osseous finding. Surgical clips at the thoracic inlet correlating with history of thyroidectomy. Distended loops of small bowel in the left upper quadrant, up to 4 cm in diameter. IMPRESSION: 1. No evidence of acute cardiopulmonary disease. Stable chest compared to prior. 2. Distended loops of bowel in the left upper quadrant, consider further abdominal imaging in this clinical setting. Electronically Signed   By: Monte Fantasia M.D.   On: 02/03/2017 15:27    EKG: Independently reviewed. Rate 90, QTc 472.  NSR.  Echocardiogram Feb 18, 2017: Report reviewed EF 65-70% No significant valvular disease    Assessment/Plan  1. Small bowel obstruction:  Visualized on CT.   -Nothing by mouth and M IVF -Consult to Gen. surgery, appreciate cares -Daily BMP -If vomiting or abdominal pain significant worsening, we will revisit NG tube -Hold dicyclomine and Lomotil   2. Acute kidney injury on chronic kidney disease:  Stage IV at baseline, renal transplant. Baseline creatinine appears to be 1.8-2. -Continue CellCept and Prograf -Continue bicarbonate -Check urine electrolytes -IV fluids and trend creatinine  3. Hypertension:  Hypertensive at admission -Continue metoprolol  4. Hypothyroidism:  -Continue levothyroxine  5. Diabetes:  This was diagnosed in the context of her renal failure. It appears this is not diet-controlled -Accu-Cheks every 4 hrs  6. Other medications:    -Hold nonessential PPI for now -Continue clonazepam at night -Hold dicyclomine and Lomotil while SBO             DVT prophylaxis: Low dose lovenox  Code Status: FULL  Family Communication: Daughters  at bedside  Disposition Plan: Anticipate IV fluids and conservative mgmt of SBO. If pain and vomiting considerably improved by tomorrow, possibly prepare for early discharge, otherwise will need inpatient care for continued IV fludis and expectant mgmt of SBO Consults called: General Surgery, Dr. Ninfa Linden Admission status: OBS At the point of initial evaluation, it is my clinical opinion that admission for OBSERVATION is reasonable and necessary because the patient's presenting complaints in the context of their chronic conditions represent sufficient risk of deterioration or significant morbidity to constitute reasonable grounds for close observation in the hospital setting, but that the patient may be medically stable for discharge from the hospital within 24 to 48 hours.    Medical decision making: Patient seen at 8:00 PM on 02/01/2017.  The patient was discussed with Dr. Ralene Bathe.  What exists of the patient's chart was reviewed in depth and summarized above.  Clinical condition: stable.        Edwin Dada [facility] Pager [phone]

## 2017-02-06 NOTE — ED Notes (Signed)
Admitting Provider at bedside. 

## 2017-02-06 NOTE — ED Triage Notes (Signed)
Pt to ER accompanied by family for worsening generalized weakness with nausea, vomiting, and abd pain onset last week after having carotid endarterectomy. Pt appears fatigued in triage, VSS however. A/o x4.

## 2017-02-06 NOTE — ED Provider Notes (Signed)
[facility]-EMERGENCY DEPT Provider Note   CSN: [id number] Arrival date & time: 01/28/2017  0938     History   Chief Complaint Chief Complaint  Patient presents with  . Weakness  . Nausea  . Abdominal Pain  . Emesis    HPI [name] is a 76 y.o. female.  Patient is a 76-year-old female with a history of diabetes, MHT, chronic kidney disease status post kidney transplant 15 years ago and recent carotid endarterectomy 12 days ago presenting today with epigastric pain and vomiting. Patient states since her surgery 12 days ago she has felt fatigued, general malaise and just not herself. However last night in the middle of night she developed epigastric discomfort and vomiting. She states she vomited 5-6 times overnight in the last episode of emesis was right before coming here today. She initially tried to take her antirejection meds which she vomited up but then was able to hold them down when attempting to take them a second time.  She denies fever or urinary symptoms. She denies cough, shortness of breath or chest pain. After getting Zofran while waiting in the waiting room her stomach has felt significantly better.   The history is provided by the patient.    Past Medical History:  Diagnosis Date  . Arthritis   . Carotid artery stenosis   . Carotid artery stenosis    Right  . Chronic kidney disease    Kidney transplant  15 years ago  . Essential hypertension   . Frequent UTI   . GERD (gastroesophageal reflux disease)   . History of blood transfusion   . History of pneumonia   . History of renal transplantation    Follows with Dr. Mattingly  . Hyperlipemia   . Hypothyroidism   . Iron deficiency anemia   . Multifocal atrial tachycardia (HCC)    Noted in 2016  . Shingles   . Type II diabetes mellitus (HCC)    no medications    Patient Active Problem List   Diagnosis Date Noted  . Stenosis of right carotid artery 01/25/2017  . Family history of adverse reaction to  anesthesia   . Thrombocytopenia (HCC) 01/06/2016  . Nodule of left lung 01/06/2016  . Gram-negative bacteremia 01/06/2016  . AKI (acute kidney injury) (HCC) 12/12/2015  . Diabetes mellitus (HCC) 12/12/2015  . CKD (chronic kidney disease) stage 4, GFR 15-29 ml/min (HCC) 12/12/2015  . Dehydration 12/12/2015  . 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 Type 2 diabetes mellitus with stage 3 chronic kidney disease, with long-term current use of insulin (HCC) 02/23/2015  . Chest pain 10/18/2014  . Nausea with vomiting 10/18/2014  . SOB (shortness of breath) 10/18/2014  . Demand ischemia (HCC) 10/18/2014  . Multifocal atrial tachycardia (HCC) 10/18/2014  . Pain in the chest   . Skin infection   . Carotid artery stenosis 10/17/2014  . Malnutrition of moderate degree (HCC) 10/17/2014  . Sepsis (HCC) 10/17/2014  . ARF (acute renal failure) (HCC)   . Cellulitis 10/16/2014  . Cellulitis of right groin 10/16/2014  . GERD (gastroesophageal reflux disease)   . Hypothyroidism   . History of kidney transplant   . Hyperkalemia   . UTI (lower urinary tract infection)   . Atrial fibrillation (HCC) 05/25/2009  . Anemia 05/06/2009  . Essential hypertension 05/06/2009  . Hyperlipidemia 10/28/2008    Past Surgical History:  Procedure Laterality Date  . ABDOMINAL HYSTERECTOMY    . APPENDECTOMY    . BACK SURGERY    .   CHOLECYSTECTOMY OPEN    . ENDARTERECTOMY Right 01/25/2017   Procedure: ENDARTERECTOMY CAROTID-RIGHT;  Surgeon: Brabham, Vance W, MD;  Location: [facility] OR;  Service: Vascular;  Laterality: Right;  . KIDNEY SURGERY     multiple  . KIDNEY TRANSPLANT Right 2004   at [facility]  . LUMBAR DISC SURGERY     "L5"  . THYROIDECTOMY     1/2 removed  . TONSILLECTOMY    . TUBAL LIGATION      OB History    No data available       Home Medications    Prior to Admission medications   Medication Sig Start Date End Date Taking? Authorizing Provider  aspirin 81 MG tablet Take 81 mg by  mouth at bedtime.     [provider]  Blood Glucose Monitoring Suppl (ACCU-CHEK AVIVA PLUS) w/Device KIT Use as directed 2 x daily 06/19/15   Nida, Gebreselassie W, MD  CELLCEPT 250 MG capsule Take 250 mg by mouth 2 (two) times daily. AT 0800 AND AT 2000 01/09/17   [provider]  clonazePAM (KLONOPIN) 1 MG tablet Take 1 mg by mouth at bedtime.    [provider]  Darbepoetin Alfa (ARANESP, ALBUMIN FREE,) 100 MCG/0.5ML SOSY injection Inject 100 mcg into the skin every 14 (fourteen) days. Last dose 01-18-17    [provider]  dicyclomine (BENTYL) 10 MG capsule Take 10 mg by mouth 3 (three) times daily as needed for spasms.     [provider]  diphenoxylate-atropine (LOMOTIL) 2.5-0.025 MG tablet Take 1 tablet by mouth 4 (four) times daily as needed for diarrhea or loose stools.    [provider]  fluconazole (DIFLUCAN) 200 MG tablet Take 200 mg by mouth daily.  01/02/17   [provider]  glucose blood (ACCU-CHEK AVIVA) test strip Use as instructed bid 06/29/15   Nida, Gebreselassie W, MD  levothyroxine (SYNTHROID, LEVOTHROID) 75 MCG tablet Take 1 tablet (75 mcg total) by mouth daily before breakfast. 12/20/16   Nida, Gebreselassie W, MD  magnesium oxide (MAG-OX) 400 MG tablet Take 400 mg by mouth daily at 12 noon.     [provider]  metoprolol (LOPRESSOR) 50 MG tablet Take 50 mg by mouth 2 (two) times daily.    [provider]  oxyCODONE (OXY IR/ROXICODONE) 5 MG immediate release tablet Take 1 tablet (5 mg total) by mouth every 6 (six) hours as needed for moderate pain. 01/26/17   Rhyne, Samantha J, PA-C  pantoprazole (PROTONIX) 40 MG tablet Take 40 mg by mouth 2 (two) times daily.  10/11/14   [provider]  sodium bicarbonate 650 MG tablet Take 2 tablets (1,300 mg total) by mouth daily at 12 noon. 01/26/17   Rhyne, Samantha J, PA-C  tacrolimus (PROGRAF) 0.5 MG capsule Take 1 mg by mouth daily.     [provider]    Family History Family History  Problem Relation Age of Onset  . Diabetes Mother   . Hypertension Mother   . Melanoma Mother   . Hyperlipidemia Mother   . Heart disease Father   . Varicose Veins Father   . Hypertension Sister   . Cancer Daughter   . Hypertension Son   . Hyperlipidemia Sister   . Hyperlipidemia Daughter   . Hyperlipidemia Son   . Hypertension Daughter     Social History Social History  Substance Use Topics  . Smoking status: Never Smoker  . Smokeless tobacco: Never Used  . Alcohol use No       Allergies   Codeine; Elavil [amitriptyline]; Iodinated diagnostic agents; Lipitor [atorvastatin]; Magnesium; Metoclopramide; Morphine; Morphine and related; Neurontin [gabapentin]; Niacin and related; Norvasc [amlodipine besylate]; Talwin [pentazocine]; and Ultram [tramadol]   Review of Systems Review of Systems  Gastrointestinal: Positive for diarrhea.       Minimal diarrhea  All other systems reviewed and are negative.    Physical Exam Updated Vital Signs BP (!) 154/82   Pulse 95   Temp 98.4 F (36.9 C) (Oral)   Resp 17   SpO2 98%   Physical Exam  Constitutional: She is oriented to person, place, and time. She appears well-developed and well-nourished. No distress.  HENT:  Head: Normocephalic and atraumatic.  Mouth/Throat: Mucous membranes are dry.  Eyes: Pupils are equal, round, and reactive to light. Conjunctivae and EOM are normal.  Neck: Normal range of motion. Neck supple.  Cardiovascular: Normal rate, regular rhythm and intact distal pulses.   No murmur heard. Pulmonary/Chest: Effort normal and breath sounds normal. No respiratory distress. She has no wheezes. She has no rales.  Abdominal: Soft. She exhibits no distension. There is tenderness. There is no rebound and no guarding.  Minimal epigastric pain.  No pain over the transplant.  Musculoskeletal: Normal range of motion. She exhibits no edema or tenderness.    Neurological: She is alert and oriented to person, place, and time.  Skin: Skin is warm and dry. No rash noted. No erythema.  Psychiatric: She has a normal mood and affect. Her behavior is normal.  Nursing note and vitals reviewed.    ED Treatments / Results  Labs (all labs ordered are listed, but only abnormal results are displayed) Labs Reviewed  COMPREHENSIVE METABOLIC PANEL - Abnormal; Notable for the following:       Result Value   Glucose, Bld 193 (*)    BUN 26 (*)    Creatinine, Ser 2.50 (*)    Calcium 8.6 (*)    Total Protein 6.3 (*)    ALT 13 (*)    Alkaline Phosphatase 138 (*)    GFR calc non Af Amer 18 (*)    GFR calc Af Amer 21 (*)    All other components within normal limits  URINALYSIS, ROUTINE W REFLEX MICROSCOPIC - Abnormal; Notable for the following:    Protein, ur >=300 (*)    Leukocytes, UA TRACE (*)    Squamous Epithelial / LPF 0-5 (*)    All other components within normal limits  LIPASE, BLOOD  CBC  I-STAT TROPONIN, ED    EKG  EKG Interpretation  Date/Time:  Monday February 06 2017 10:45:13 EDT Ventricular Rate:  91 PR Interval:  112 QRS Duration: 68 QT Interval:  394 QTC Calculation: 484 R Axis:   90 Text Interpretation:  Normal sinus rhythm Rightward axis Nonspecific ST abnormality No significant change since last tracing Confirmed by Plunkett, Whitney ([id number]) on 01/31/2017 1:46:44 PM       Radiology Dg Chest 2 View  Result Date: 02/05/2017 CLINICAL DATA:  Nausea and vomiting EXAM: CHEST  2 VIEW COMPARISON:  07/08/2016 FINDINGS: Normal heart size and stable mediastinal contours. There is no edema, consolidation, effusion, or pneumothorax. Linear opacities in the right upper lung, stable compared to priors, especially 12/12/2015, compatible with scarring. Stable nodular density overlapping the anterior left third rib. No acute osseous finding. Surgical clips at the thoracic inlet correlating with history of thyroidectomy. Distended loops of  small bowel in the left upper quadrant, up to 4 cm in diameter. IMPRESSION: 1. No   evidence of acute cardiopulmonary disease. Stable chest compared to prior. 2. Distended loops of bowel in the left upper quadrant, consider further abdominal imaging in this clinical setting. Electronically Signed   By: Jonathon  Watts M.D.   On: 01/17/2017 15:27    Procedures Procedures (including critical care time)  Medications Ordered in ED Medications  ondansetron (ZOFRAN-ODT) 4 MG disintegrating tablet (not administered)  sodium chloride 0.9 % bolus 1,000 mL (not administered)  ondansetron (ZOFRAN-ODT) disintegrating tablet 4 mg (4 mg Oral Given 01/14/2017 1035)     Initial Impression / Assessment and Plan / ED Course  I have reviewed the triage vital signs and the nursing notes.  Pertinent labs & imaging results that were available during my care of the patient were reviewed by me and considered in my medical decision making (see chart for details).     Elderly female presenting with a history of multiple medical problems including kidney transplant who is currently immunocompromise presents with epigastric discomfort and vomiting. She denies any fever, shortness of breath or chest pain.  She has had a minimal amount of diarrhea as well as. After arriving here she was given ODT Zofran with significant improvement in her nausea and even the epigastric pain has improved. She denies any urinary symptoms but states she has had infections in the past.  Patient has no pain over her transplant site and has not missed any antirejection meds.   Labs today show new AK I with a renal function of 2.5. Baseline is usually 2. Electrolytes, lipase and CBC are within normal limits.  EKG without acute findings. Will get a troponin to evaluate for atypical MI. Also patient given IV fluids. Chest x-ray to rule out developing infection or other acute process that could cause similar symptoms. Also UA pending.  3:43 PM Patient  checks x-ray with some left bowel distention. Will do CT to rule out obstruction. UA without significant findings of infection. Will give patient a bolus. If CT is normal patient is feeling better after fluids and improved creatinine may be able to go home.  Pt checked out to Dr. Rees at 1600.  Final Clinical Impressions(s) / ED Diagnoses   Final diagnoses:  None    New Prescriptions New Prescriptions   No medications on file     Plunkett, Whitney, MD 02/12/2017 1545  

## 2017-02-07 ENCOUNTER — Observation Stay (HOSPITAL_COMMUNITY): Payer: Medicare Other

## 2017-02-07 DIAGNOSIS — R6520 Severe sepsis without septic shock: Secondary | ICD-10-CM | POA: Diagnosis not present

## 2017-02-07 DIAGNOSIS — Z940 Kidney transplant status: Secondary | ICD-10-CM | POA: Diagnosis not present

## 2017-02-07 DIAGNOSIS — R6521 Severe sepsis with septic shock: Secondary | ICD-10-CM | POA: Diagnosis not present

## 2017-02-07 DIAGNOSIS — K219 Gastro-esophageal reflux disease without esophagitis: Secondary | ICD-10-CM | POA: Diagnosis present

## 2017-02-07 DIAGNOSIS — Z681 Body mass index (BMI) 19 or less, adult: Secondary | ICD-10-CM | POA: Diagnosis not present

## 2017-02-07 DIAGNOSIS — G92 Toxic encephalopathy: Secondary | ICD-10-CM | POA: Diagnosis not present

## 2017-02-07 DIAGNOSIS — J9600 Acute respiratory failure, unspecified whether with hypoxia or hypercapnia: Secondary | ICD-10-CM | POA: Diagnosis not present

## 2017-02-07 DIAGNOSIS — K56609 Unspecified intestinal obstruction, unspecified as to partial versus complete obstruction: Secondary | ICD-10-CM | POA: Diagnosis not present

## 2017-02-07 DIAGNOSIS — I471 Supraventricular tachycardia: Secondary | ICD-10-CM | POA: Diagnosis not present

## 2017-02-07 DIAGNOSIS — K559 Vascular disorder of intestine, unspecified: Secondary | ICD-10-CM | POA: Diagnosis not present

## 2017-02-07 DIAGNOSIS — N179 Acute kidney failure, unspecified: Secondary | ICD-10-CM | POA: Diagnosis not present

## 2017-02-07 DIAGNOSIS — E43 Unspecified severe protein-calorie malnutrition: Secondary | ICD-10-CM | POA: Diagnosis not present

## 2017-02-07 DIAGNOSIS — E039 Hypothyroidism, unspecified: Secondary | ICD-10-CM | POA: Diagnosis not present

## 2017-02-07 DIAGNOSIS — I21A1 Myocardial infarction type 2: Secondary | ICD-10-CM | POA: Diagnosis not present

## 2017-02-07 DIAGNOSIS — R040 Epistaxis: Secondary | ICD-10-CM

## 2017-02-07 DIAGNOSIS — E785 Hyperlipidemia, unspecified: Secondary | ICD-10-CM | POA: Diagnosis present

## 2017-02-07 DIAGNOSIS — Z79899 Other long term (current) drug therapy: Secondary | ICD-10-CM | POA: Diagnosis not present

## 2017-02-07 DIAGNOSIS — A419 Sepsis, unspecified organism: Secondary | ICD-10-CM | POA: Diagnosis not present

## 2017-02-07 DIAGNOSIS — D62 Acute posthemorrhagic anemia: Secondary | ICD-10-CM | POA: Diagnosis not present

## 2017-02-07 DIAGNOSIS — Z7982 Long term (current) use of aspirin: Secondary | ICD-10-CM | POA: Diagnosis not present

## 2017-02-07 DIAGNOSIS — R1013 Epigastric pain: Secondary | ICD-10-CM | POA: Diagnosis present

## 2017-02-07 DIAGNOSIS — J690 Pneumonitis due to inhalation of food and vomit: Secondary | ICD-10-CM | POA: Diagnosis not present

## 2017-02-07 DIAGNOSIS — Z8701 Personal history of pneumonia (recurrent): Secondary | ICD-10-CM | POA: Diagnosis not present

## 2017-02-07 DIAGNOSIS — E1122 Type 2 diabetes mellitus with diabetic chronic kidney disease: Secondary | ICD-10-CM | POA: Diagnosis present

## 2017-02-07 DIAGNOSIS — J80 Acute respiratory distress syndrome: Secondary | ICD-10-CM | POA: Diagnosis not present

## 2017-02-07 DIAGNOSIS — Z90710 Acquired absence of both cervix and uterus: Secondary | ICD-10-CM | POA: Diagnosis not present

## 2017-02-07 DIAGNOSIS — R579 Shock, unspecified: Secondary | ICD-10-CM | POA: Diagnosis not present

## 2017-02-07 DIAGNOSIS — E1151 Type 2 diabetes mellitus with diabetic peripheral angiopathy without gangrene: Secondary | ICD-10-CM | POA: Diagnosis present

## 2017-02-07 DIAGNOSIS — E872 Acidosis: Secondary | ICD-10-CM | POA: Diagnosis not present

## 2017-02-07 DIAGNOSIS — N184 Chronic kidney disease, stage 4 (severe): Secondary | ICD-10-CM | POA: Diagnosis not present

## 2017-02-07 DIAGNOSIS — E118 Type 2 diabetes mellitus with unspecified complications: Secondary | ICD-10-CM | POA: Diagnosis not present

## 2017-02-07 LAB — CBC
HEMATOCRIT: 33.5 % — AB (ref 36.0–46.0)
HEMOGLOBIN: 10.6 g/dL — AB (ref 12.0–15.0)
MCH: 30.4 pg (ref 26.0–34.0)
MCHC: 31.6 g/dL (ref 30.0–36.0)
MCV: 96 fL (ref 78.0–100.0)
Platelets: 142 10*3/uL — ABNORMAL LOW (ref 150–400)
RBC: 3.49 MIL/uL — ABNORMAL LOW (ref 3.87–5.11)
RDW: 14.5 % (ref 11.5–15.5)
WBC: 3 10*3/uL — ABNORMAL LOW (ref 4.0–10.5)

## 2017-02-07 LAB — GLUCOSE, CAPILLARY
GLUCOSE-CAPILLARY: 110 mg/dL — AB (ref 65–99)
GLUCOSE-CAPILLARY: 93 mg/dL (ref 65–99)
GLUCOSE-CAPILLARY: 85 mg/dL (ref 65–99)
Glucose-Capillary: 101 mg/dL — ABNORMAL HIGH (ref 65–99)
Glucose-Capillary: 108 mg/dL — ABNORMAL HIGH (ref 65–99)
Glucose-Capillary: 120 mg/dL — ABNORMAL HIGH (ref 65–99)
Glucose-Capillary: 73 mg/dL (ref 65–99)

## 2017-02-07 LAB — BASIC METABOLIC PANEL
ANION GAP: 9 (ref 5–15)
BUN: 25 mg/dL — AB (ref 6–20)
CHLORIDE: 107 mmol/L (ref 101–111)
CO2: 23 mmol/L (ref 22–32)
Calcium: 8.3 mg/dL — ABNORMAL LOW (ref 8.9–10.3)
Creatinine, Ser: 2.28 mg/dL — ABNORMAL HIGH (ref 0.44–1.00)
GFR calc Af Amer: 23 mL/min — ABNORMAL LOW (ref >60)
GFR, EST NON AFRICAN AMERICAN: 20 mL/min — AB (ref >60)
GLUCOSE: 100 mg/dL — AB (ref 65–99)
POTASSIUM: 4.2 mmol/L (ref 3.5–5.1)
Sodium: 139 mmol/L (ref 135–145)

## 2017-02-07 LAB — HEMOGLOBIN AND HEMATOCRIT, BLOOD
HCT: 30.1 % — ABNORMAL LOW (ref 36.0–46.0)
Hemoglobin: 9.3 g/dL — ABNORMAL LOW (ref 12.0–15.0)

## 2017-02-07 MED ORDER — DIPHENHYDRAMINE HCL 25 MG PO CAPS: 25.0000 mg | ORAL_CAPSULE | ORAL | Status: DC | PRN

## 2017-02-07 MED ORDER — ONDANSETRON HCL 4 MG/2ML IJ SOLN
4.0000 mg | Freq: Once | INTRAMUSCULAR | Status: AC
  Administered 2017-02-07: 4 mg via INTRAVENOUS
  Filled 2017-02-07: qty 2

## 2017-02-07 MED ORDER — ASPIRIN EC 81 MG PO TBEC
81.0000 mg | DELAYED_RELEASE_TABLET | Freq: Every day | ORAL | Status: DC
  Filled 2017-02-07: qty 1

## 2017-02-07 MED ORDER — MORPHINE SULFATE (PF) 2 MG/ML IV SOLN: 2.0000 mg | INTRAVENOUS | Status: DC | PRN

## 2017-02-07 MED ORDER — ACETAMINOPHEN 325 MG PO TABS
650.0000 mg | ORAL_TABLET | Freq: Four times a day (QID) | ORAL | Status: DC | PRN
  Administered 2017-02-07 – 2017-02-08 (×3): 650 mg via ORAL
  Filled 2017-02-07 (×3): qty 2

## 2017-02-07 MED ORDER — ENOXAPARIN SODIUM 30 MG/0.3ML ~~LOC~~ SOLN
30.0000 mg | SUBCUTANEOUS | Status: DC
  Administered 2017-02-07: 30 mg via SUBCUTANEOUS
  Filled 2017-02-07: qty 0.3

## 2017-02-07 MED ORDER — LEVOTHYROXINE SODIUM 75 MCG PO TABS
75.0000 ug | ORAL_TABLET | Freq: Every day | ORAL | Status: DC
  Administered 2017-02-07: 75 ug via ORAL
  Filled 2017-02-07: qty 1

## 2017-02-07 MED ORDER — FLUCONAZOLE 100 MG PO TABS
200.0000 mg | ORAL_TABLET | Freq: Every day | ORAL | Status: DC
  Administered 2017-02-07: 200 mg via ORAL
  Filled 2017-02-07: qty 2

## 2017-02-07 MED ORDER — OXYMETAZOLINE HCL 0.05 % NA SOLN
1.0000 | Freq: Two times a day (BID) | NASAL | Status: DC
  Filled 2017-02-07: qty 15

## 2017-02-07 MED ORDER — METOPROLOL TARTRATE 50 MG PO TABS
50.0000 mg | ORAL_TABLET | Freq: Two times a day (BID) | ORAL | Status: DC
  Administered 2017-02-07 (×3): 50 mg via ORAL
  Filled 2017-02-07 (×3): qty 1

## 2017-02-07 MED ORDER — INSULIN ASPART 100 UNIT/ML ~~LOC~~ SOLN
0.0000 [IU] | SUBCUTANEOUS | Status: DC
  Administered 2017-02-08 (×2): 3 [IU] via SUBCUTANEOUS

## 2017-02-07 MED ORDER — SODIUM BICARBONATE 650 MG PO TABS
1300.0000 mg | ORAL_TABLET | Freq: Every day | ORAL | Status: DC
  Administered 2017-02-07: 1300 mg via ORAL
  Filled 2017-02-07 (×2): qty 2

## 2017-02-07 MED ORDER — OXYMETAZOLINE HCL 0.05 % NA SOLN
3.0000 | Freq: Two times a day (BID) | NASAL | Status: DC
  Administered 2017-02-07 (×2): 3 via NASAL
  Filled 2017-02-07: qty 15

## 2017-02-07 MED ORDER — LEVOTHYROXINE SODIUM 100 MCG IV SOLR
37.5000 ug | Freq: Every day | INTRAVENOUS | Status: DC
  Administered 2017-02-08: 37.5 ug via INTRAVENOUS
  Filled 2017-02-07: qty 5

## 2017-02-07 MED ORDER — TRAMADOL HCL 50 MG PO TABS: 50.0000 mg | ORAL_TABLET | Freq: Once | ORAL | Status: DC

## 2017-02-07 MED ORDER — SODIUM CHLORIDE 0.9 % IV BOLUS (SEPSIS)
500.0000 mL | Freq: Once | INTRAVENOUS | Status: AC
  Administered 2017-02-07: 500 mL via INTRAVENOUS

## 2017-02-07 MED ORDER — PANTOPRAZOLE SODIUM 40 MG IV SOLR
40.0000 mg | INTRAVENOUS | Status: DC
  Administered 2017-02-07 – 2017-02-08 (×2): 40 mg via INTRAVENOUS
  Filled 2017-02-07 (×2): qty 40

## 2017-02-07 MED ORDER — FLUCONAZOLE IN SODIUM CHLORIDE 200-0.9 MG/100ML-% IV SOLN
200.0000 mg | INTRAVENOUS | Status: DC
  Administered 2017-02-08: 200 mg via INTRAVENOUS
  Filled 2017-02-07 (×2): qty 100

## 2017-02-07 MED ORDER — LIDOCAINE HCL 2 % EX GEL
1.0000 "application " | Freq: Once | CUTANEOUS | Status: AC
  Administered 2017-02-07: 1
  Filled 2017-02-07: qty 5

## 2017-02-07 MED ORDER — MYCOPHENOLATE MOFETIL HCL 500 MG IV SOLR
250.0000 mg | Freq: Two times a day (BID) | INTRAVENOUS | Status: DC
  Administered 2017-02-07 – 2017-02-08 (×2): 250 mg via INTRAVENOUS
  Filled 2017-02-07 (×6): qty 7.5

## 2017-02-07 MED ORDER — OXYMETAZOLINE HCL 0.05 % NA SOLN: 2.0000 | Freq: Two times a day (BID) | NASAL | Status: DC

## 2017-02-07 MED ORDER — DIATRIZOATE MEGLUMINE & SODIUM 66-10 % PO SOLN
90.0000 mL | Freq: Once | ORAL | Status: DC
  Filled 2017-02-07: qty 90

## 2017-02-07 MED ORDER — INFLUENZA VAC SPLIT HIGH-DOSE 0.5 ML IM SUSY
0.5000 mL | PREFILLED_SYRINGE | INTRAMUSCULAR | Status: DC
  Filled 2017-02-07: qty 0.5

## 2017-02-07 MED ORDER — TACROLIMUS 0.5 MG PO CAPS
0.5000 mg | ORAL_CAPSULE | Freq: Two times a day (BID) | ORAL | Status: DC
  Administered 2017-02-07 (×2): 0.5 mg via SUBLINGUAL
  Filled 2017-02-07 (×3): qty 1

## 2017-02-07 NOTE — Progress Notes (Signed)
Pt admitted to [facility]. On arrival pt is alert and orientedx4. Pt in with SBO. Pt having mild abdominal pain 4/10. MD paged and made aware, ordered obtained for tylenol. Pt medicated with good relief. Daughter at bedside. Pt and family updated on plan of care and educated on unit and equipment. Pt's vss. WCTM

## 2017-02-07 NOTE — Progress Notes (Signed)
PROGRESS NOTE    [name]  ZOX:[id number] DOB: July 06, 1940 DOA: 01/23/2017 PCP: Samuel Jester, DO   Outpatient Specialists:    Brief Narrative:  [name] is a 76 y.o. 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 Myra Gianotti who presents with abdominal pain and vomiting.  She underwent a recent right CEA by Dr. Myra Gianotti, uneventful postoperative course. She was home with her daughter for about a week, was eating and drinking and ambulating well, so about 4 days ago went back home where she lives by herself.   Then in the last 3 days, she has had gradually progressive upper abdominal pain that was sharp and colicky, associated with some nausea. This became progressively worse until today it was severe, she was vomiting all day, nonbloody nonbilious vomit, and finally came to the emergency room.   Assessment & Plan:   Principal Problem:   SBO (small bowel obstruction) (HCC) Active Problems:   Essential hypertension   Hypothyroidism   AKI (acute kidney injury) (HCC)   Diabetes mellitus (HCC)   CKD (chronic kidney disease) stage 4, GFR 15-29 ml/min (HCC)   Small bowel obstruction:  Visualized on CT.   -NG tube placement -Consult to Gen. surgery -NG tube placement for decompression-- patient finally agreeable   Acute kidney injury on chronic kidney disease:  Stage IV at baseline, renal transplant. Baseline creatinine appears to be 2 -Continue CellCept and Prograf in IV form -Continue bicarbonate -IV fluids and trend creatinine  Hypertension:  Hypertensive at admission -Continue metoprolol  Hypothyroidism:  -Continue levothyroxine  Diabetes:  -Accu-Cheks every 4 hrs     DVT prophylaxis:  Lovenox   Code Status: Full Code   Family Communication:   Disposition Plan:     Consultants:  General surgery  Subjective: No  flatus  Objective: Vitals:   02/12/2017 1945 01/29/2017 2000 02/07/17 0003 02/07/17 0433  BP:  (!) 164/80 (!) 156/82 (!) 159/74  Pulse: (!) 102 99 97 84  Resp: Temp:   98.7 F (37.1 C)   TempSrc:   Oral   SpO2: 100% 99% 99% 97%  Weight:   38.6 kg (85 lb 1.6 oz)   Height:    (1.6 m)     Intake/Output Summary (Last 24 hours) at 02/07/17 1239 Last data filed at 02/07/17 0600  Gross per 24 hour  Intake             1900 ml  Output                0 ml  Net             1900 ml   Filed Weights   02/07/17 0003  Weight: 38.6 kg (85 lb 1.6 oz)    Examination:  General exam: appears to be in minimal discomfort Respiratory system: Clear to auscultation. Respiratory effort normal. Cardiovascular system: S1 & S2 heard, RRR. No JVD, murmurs, rubs, gallops or clicks. No pedal edema. Gastrointestinal system: no bowel sounds, minimally tender to palpation Central nervous system: Alert and oriented. No focal neurological deficits. Extremities: Symmetric 5 x 5 power. Skin: No rashes, lesions or ulcers Psychiatry: Judgement and insight appear normal. Mood & affect appropriate.     Data Reviewed: I have personally reviewed following labs and imaging studies  CBC:  Recent Labs Lab 02/01/17 1013 02/02/2017 1030 02/07/17 0655  WBC  --  4.1 3.0*  HGB 9.2* 12.4 10.6*  HCT  --  38.3 33.5*  MCV  --  94.8 96.0  PLT  --  180 142*   Basic Metabolic Panel:  Recent Labs Lab 02/09/17 1030 02/07/17 0655  NA 138 139  K 4.3 4.2  CL 102 107  CO2 25 23  GLUCOSE 193* 100*  BUN 26* 25*  CREATININE 2.50* 2.28*  CALCIUM 8.6* 8.3*   GFR: Estimated Creatinine Clearance: 13.2 mL/min (A) (by C-G formula based on SCr of 2.28 mg/dL (H)). Liver Function Tests:  Recent Labs Lab 2017-02-09 1030  AST 26  ALT 13*  ALKPHOS 138*  BILITOT 0.7  PROT 6.3*  ALBUMIN 4.2    Recent Labs Lab 02-09-2017 1030  LIPASE 22   No results for input(s): AMMONIA in the last 168  hours. Coagulation Profile: No results for input(s): INR, PROTIME in the last 168 hours. Cardiac Enzymes: No results for input(s): CKTOTAL, CKMB, CKMBINDEX, TROPONINI in the last 168 hours. BNP (last 3 results) No results for input(s): PROBNP in the last 8760 hours. HbA1C: No results for input(s): HGBA1C in the last 72 hours. CBG:  Recent Labs Lab 02/07/17 0018 02/07/17 0431 02/07/17 0748 02/07/17 1157  GLUCAP 120* 110* 108* 93   Lipid Profile: No results for input(s): CHOL, HDL, LDLCALC, TRIG, CHOLHDL, LDLDIRECT in the last 72 hours. Thyroid Function Tests: No results for input(s): TSH, T4TOTAL, FREET4, T3FREE, THYROIDAB in the last 72 hours. Anemia Panel: No results for input(s): VITAMINB12, FOLATE, FERRITIN, TIBC, IRON, RETICCTPCT in the last 72 hours. Urine analysis:    Component Value Date/Time   COLORURINE YELLOW 2017/02/09 1435   APPEARANCEUR CLEAR 2017-02-09 1435   LABSPEC 1.013 2017-02-09 1435   PHURINE 6.0 02/09/2017 1435   GLUCOSEU NEGATIVE 02-09-17 1435   HGBUR NEGATIVE 02/09/17 1435   BILIRUBINUR NEGATIVE 2017/02/09 1435   KETONESUR NEGATIVE 09-Feb-2017 1435   PROTEINUR >=300 (A) 02/09/17 1435   UROBILINOGEN 0.2 10/16/2014 2059   NITRITE NEGATIVE 2017-02-09 1435   LEUKOCYTESUR TRACE (A) 02-09-2017 1435     )No results found for this or any previous visit (from the past 240 hour(s)).    Anti-infectives    Start     Dose/Rate Route Frequency Ordered Stop   02/01/2017 1100  fluconazole (DIFLUCAN) IVPB 200 mg     200 mg 100 mL/hr over 60 Minutes Intravenous Every 24 hours 02/07/17 1138     02/07/17 0004  fluconazole (DIFLUCAN) tablet 200 mg  Status:  Discontinued     200 mg Oral Daily 02/07/17 0004 02/07/17 1137       Radiology Studies: Ct Abdomen Pelvis Wo Contrast  Result Date: 09-Feb-2017 CLINICAL DATA:  Generalize weakness with nausea vomiting and abdominal pain EXAM: CT ABDOMEN AND PELVIS WITHOUT CONTRAST TECHNIQUE: Multidetector CT imaging  of the abdomen and pelvis was performed following the standard protocol without IV contrast. COMPARISON:  12/15/2015 FINDINGS: Lower chest: Lung bases demonstrate no acute consolidation or pleural effusion. Normal heart size. Hepatobiliary: No focal liver abnormality is seen. Status post cholecystectomy. No biliary dilatation. Pancreas: Unremarkable. No pancreatic ductal dilatation or surrounding inflammatory changes. Spleen: Normal in size without focal abnormality. Adrenals/Urinary Tract: Adrenal glands are within normal limits. Marked atrophy of the native kidneys. Negative for hydronephrosis. Scattered calcifications in the left kidney. Stable 3.5 cm cyst lower pole right kidney. Right lower quadrant transplanted kidney with embolization coils. Urinary bladder unremarkable Stomach/Bowel: Moderate enlargement of the stomach. Dilated loops of jejunum in the left upper quadrant, measuring  up to 3.7 cm. Transition point visualized within the mid small bowel in the upper pelvis, just to the right of midline, series 3, image number 48. No significant bowel wall thickening. Distal small bowel and colon are collapsed. Vascular/Lymphatic: Aortic atherosclerosis. No enlarged abdominal or pelvic lymph nodes. Reproductive: Status post hysterectomy. No adnexal masses. Other: Negative for free air. Small amount of free fluid in the pelvis an adjacent to the liver Musculoskeletal: Degenerative changes. No acute or suspicious bone lesion. IMPRESSION: 1. Multiple loops of dilated small bowel, mostly on the left side of the abdomen with transition point visualized in the proximal to mid small bowel, in the upper pelvis just to the right of midline ; the small bowel loops and colon distal to this are decompressed. Findings consistent with mechanical small bowel obstruction possibly due to adhesions or internal hernia. 2. Small amount of free fluid in the abdomen and pelvis 3. Atrophy of the native kidneys. Right lower quadrant  transplanted kidney. Electronically Signed   By: Jasmine Pang M.D.   On: Feb 19, 2017 19:01   Dg Chest 2 View  Result Date: 2017-02-19 CLINICAL DATA:  Nausea and vomiting EXAM: CHEST  2 VIEW COMPARISON:  07/08/2016 FINDINGS: Normal heart size and stable mediastinal contours. There is no edema, consolidation, effusion, or pneumothorax. Linear opacities in the right upper lung, stable compared to priors, especially 12/12/2015, compatible with scarring. Stable nodular density overlapping the anterior left third rib. No acute osseous finding. Surgical clips at the thoracic inlet correlating with history of thyroidectomy. Distended loops of small bowel in the left upper quadrant, up to 4 cm in diameter. IMPRESSION: 1. No evidence of acute cardiopulmonary disease. Stable chest compared to prior. 2. Distended loops of bowel in the left upper quadrant, consider further abdominal imaging in this clinical setting. Electronically Signed   By: Marnee Spring M.D.   On: 02-19-17 15:27   Dg Abd 2 Views  Result Date: 02/07/2017 CLINICAL DATA:  Scratched Small bowel obstruction. EXAM: ABDOMEN - 2 VIEW COMPARISON:  CT 02-19-17. FINDINGS: Surgical clips right upper quadrant. Multiple distended loops of small bowel again noted. Bowel distention has improved slightly . Findings consistent with persistent small-bowel obstruction with possible slight improved. Stool and air noted within the colon. No free air . Pelvic calcifications consistent phleboliths. Lumbar spine scoliosis concave left again noted. Degenerative changes lumbar spine and both hips. IMPRESSION: Findings consistent with small bowel obstruction with possible slight improvement in distention of small bowel. Stool and air noted within the colon. No free air. Electronically Signed   By: Maisie Fus  Register   On: 02/07/2017 08:47        Scheduled Meds: . aspirin EC  81 mg Oral QHS  . clonazePAM  1 mg Oral QHS  . diatrizoate meglumine-sodium  90 mL Per NG  tube Once  . enoxaparin (LOVENOX) injection  30 mg Subcutaneous Q24H  . [START ON 01/14/2017] Influenza vac split quadrivalent PF  0.5 mL Intramuscular Tomorrow-1000  . insulin aspart  0-9 Units Subcutaneous Q4H  . [START ON 01/17/2017] levothyroxine  37.5 mcg Intravenous Daily  . lidocaine  1 application Other Once  . metoprolol tartrate  50 mg Oral BID  . pantoprazole (PROTONIX) IV  40 mg Intravenous Q24H  . sodium bicarbonate  1,300 mg Oral Q1200  . tacrolimus  0.5 mg Sublingual BID  . traMADol  50 mg Oral Once   Continuous Infusions: . sodium chloride 75 mL/hr at 02/19/17 2111  . [START ON 01/28/2017] fluconazole (  DIFLUCAN) IV    . mycophenolate (CELLCEPT) IV       LOS: 0 days    Time spent: 35 min    Ajay Strubel U Abdifatah Colquhoun, DO [facility] Pager [phone]  If 7PM-7AM, please contact night-coverage [email or web address] Password [facility] 02/07/2017, 12:39 PM

## 2017-02-07 NOTE — Consult Note (Signed)
Reason for Consult: Epistaxis Referring Physician: Eulogio Bear, DO  HPI:  [name] is an 76 y.o. female who was admitted for small bowel obstruction. She developed severe left epistaxis after multiple failed attempts to place an NG tube. ENT was consulted for evaluation and treatment.  Past Medical History:  Diagnosis Date  . Arthritis   . Carotid artery stenosis   . Carotid artery stenosis    Right  . Chronic kidney disease    Kidney transplant  15 years ago  . Essential hypertension   . Frequent UTI   . GERD (gastroesophageal reflux disease)   . History of blood transfusion   . History of pneumonia   . History of renal transplantation    Follows with Dr. Mercy Moore  . Hyperlipemia   . Hypothyroidism   . Iron deficiency anemia   . 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 Shingles   . Type II diabetes mellitus (HCC)    no medications    Past Surgical History:  Procedure Laterality Date  . ABDOMINAL HYSTERECTOMY    . APPENDECTOMY    . BACK SURGERY    . CHOLECYSTECTOMY OPEN    . ENDARTERECTOMY Right 01/25/2017   Procedure: ENDARTERECTOMY CAROTID-RIGHT;  Surgeon: Serafina Mitchell, MD;  Location: [facility] OR;  Service: Vascular;  Laterality: Right;  . KIDNEY SURGERY     multiple  . KIDNEY TRANSPLANT Right 2004   at [facility]  . LUMBAR DISC SURGERY     "L5"  . THYROIDECTOMY     1/2 removed  . TONSILLECTOMY    . TUBAL LIGATION      Family History  Problem Relation Age of Onset  . Diabetes Mother   . Hypertension Mother   . Melanoma Mother   . Hyperlipidemia Mother   . Heart disease Father   . Varicose Veins Father   . Hypertension Sister   . Cancer Daughter   . Hypertension Son   . Hyperlipidemia Sister   . Hyperlipidemia Daughter   . Hyperlipidemia Son   . Hypertension Daughter     Social History:  reports that she has never smoked. She has never used smokeless tobacco. She reports that she does not drink alcohol or use drugs.  Allergies:   Allergies  Allergen Reactions  . Codeine Nausea And Vomiting  . Elavil [Amitriptyline] Other (See Comments)    "makes legs hurt"  . Iodinated Diagnostic Agents Other (See Comments)    Renal transplant pt  . Lipitor [Atorvastatin] Other (See Comments)    Muscle pain   . Magnesium Hives    Pt tolerated oral and IV during 06/2014 admission   . Metoclopramide Other (See Comments)    Other reaction(s): Other (See Comments) "doesn't work"  . Morphine And Related Other (See Comments)    hallucinations   . Neurontin [Gabapentin] Other (See Comments)    Muscle pain   . Niacin And Related Other (See Comments)    Muscle pain   . Norvasc [Amlodipine Besylate] Other (See Comments)    Muscle pain  . Talwin [Pentazocine] Nausea Only and Other (See Comments)    hallucinations  . Ultram [Tramadol] Itching    Prior to Admission medications   Medication Sig Start Date End Date Taking? Authorizing Provider  aspirin 81 MG tablet Take 81 mg by mouth at bedtime.    Yes [provider]  Blood Glucose Monitoring Suppl (ACCU-CHEK AVIVA PLUS) w/Device KIT Use as directed 2 x daily 06/19/15  Yes Cassandria Anger, MD  CELLCEPT 250 MG capsule Take 250 mg by mouth 2 (two) times daily. AT 0800 AND AT 2000 01/09/17  Yes [provider]  clonazePAM (KLONOPIN) 1 MG tablet Take 1 mg by mouth at bedtime.   Yes [provider]  Darbepoetin Alfa (ARANESP, ALBUMIN FREE,) 100 MCG/0.5ML SOSY injection Inject 100 mcg into the skin every 14 (fourteen) days. Last dose 01-18-17   Yes [provider]  dicyclomine (BENTYL) 10 MG capsule Take 10 mg by mouth 3 (three) times daily as needed for spasms.    Yes [provider]  diphenoxylate-atropine (LOMOTIL) 2.5-0.025 MG tablet Take 1 tablet by mouth 4 (four) times daily as needed for diarrhea or loose stools.   Yes [provider]  fluconazole (DIFLUCAN) 200 MG tablet Take 200 mg by mouth daily.  01/02/17  Yes [provider]  glucose blood (ACCU-CHEK AVIVA) test strip Use as instructed bid 06/29/15  Yes Nida, Marella Chimes, MD  levothyroxine (SYNTHROID, LEVOTHROID) 75 MCG tablet Take 1 tablet (75 mcg total) by mouth daily before breakfast. 12/20/16  Yes Nida, Marella Chimes, MD  magnesium oxide (MAG-OX) 400 MG tablet Take 400 mg by mouth daily at 12 noon.    Yes [provider]  metoprolol (LOPRESSOR) 50 MG tablet Take 50 mg by mouth 2 (two) times daily.   Yes [provider]  oxyCODONE (OXY IR/ROXICODONE) 5 MG immediate release tablet Take 1 tablet (5 mg total) by mouth every 6 (six) hours as needed for moderate pain. 01/26/17  Yes Rhyne, Samantha J, PA-C  pantoprazole (PROTONIX) 40 MG tablet Take 40 mg by mouth 2 (two) times daily.  10/11/14  Yes [provider]  sodium bicarbonate 650 MG tablet Take 2 tablets (1,300 mg total) by mouth daily at 12 noon. 01/26/17  Yes Rhyne, Hulen Shouts, PA-C  tacrolimus (PROGRAF) 0.5 MG capsule Take 1 mg by mouth daily.    Yes [provider]    Medications:  I have reviewed the patient's current medications. Scheduled: . clonazePAM  1 mg Oral QHS  . diatrizoate meglumine-sodium  90 mL Per NG tube Once  . enoxaparin (LOVENOX) injection  30 mg Subcutaneous Q24H  . [START ON 02/17/17] Influenza vac split quadrivalent PF  0.5 mL Intramuscular Tomorrow-1000  . insulin aspart  0-9 Units Subcutaneous Q4H  . [START ON 02/17/17] levothyroxine  37.5 mcg Intravenous Daily  . metoprolol tartrate  50 mg Oral BID  . ondansetron (ZOFRAN) IV  4 mg Intravenous Once  . oxymetazoline  3 spray Each Nare BID  . pantoprazole (PROTONIX) IV  40 mg Intravenous Q24H  . sodium bicarbonate  1,300 mg Oral Q1200  . tacrolimus  0.5 mg Sublingual BID  . traMADol  50 mg Oral Once   Continuous: . sodium chloride 75 mL/hr at 02/07/17 1522  . [START ON 17-Feb-2017] fluconazole (DIFLUCAN) IV    . mycophenolate (CELLCEPT) IV     ATF:[id number],  diphenhydrAMINE, morphine injection, ondansetron (ZOFRAN) IV, promethazine **OR** promethazine **OR** promethazine  Results for orders placed or performed during the hospital encounter of 01/15/2017 (from the past 48 hour(s))  Lipase, blood     Status: None   Collection Time: 02/05/2017 10:30 AM  Result Value Ref Range   Lipase 22 11 - 51 U/L  Comprehensive metabolic panel     Status: Abnormal   Collection Time: 02/03/2017 10:30 AM  Result Value Ref Range   Sodium 138 135 - 145 mmol/L   Potassium  4.3 3.5 - 5.1 mmol/L   Chloride 102 101 - 111 mmol/L   CO2 25 22 - 32 mmol/L   Glucose, Bld 193 (H) 65 - 99 mg/dL   BUN 26 (H) 6 - 20 mg/dL   Creatinine, Ser 2.50 (H) 0.44 - 1.00 mg/dL   Calcium 8.6 (L) 8.9 - 10.3 mg/dL   Total Protein 6.3 (L) 6.5 - 8.1 g/dL   Albumin 4.2 3.5 - 5.0 g/dL   AST 26 15 - 41 U/L   ALT 13 (L) 14 - 54 U/L   Alkaline Phosphatase 138 (H) 38 - 126 U/L   Total Bilirubin 0.7 0.3 - 1.2 mg/dL   GFR calc non Af Amer 18 (L) >60 mL/min   GFR calc Af Amer 21 (L) >60 mL/min    Comment: (NOTE) The eGFR has been calculated using the CKD EPI equation. This calculation has not been validated in all clinical situations. eGFR's persistently <60 mL/min signify possible Chronic Kidney Disease.    Anion gap 11 5 - 15  CBC     Status: None   Collection Time: 01/29/2017 10:30 AM  Result Value Ref Range   WBC 4.1 4.0 - 10.5 K/uL   RBC 4.04 3.87 - 5.11 MIL/uL   Hemoglobin 12.4 12.0 - 15.0 g/dL   HCT 38.3 36.0 - 46.0 %   MCV 94.8 78.0 - 100.0 fL   MCH 30.7 26.0 - 34.0 pg   MCHC 32.4 30.0 - 36.0 g/dL   RDW 14.6 11.5 - 15.5 %   Platelets 180 150 - 400 K/uL  Urinalysis, Routine w reflex microscopic     Status: Abnormal   Collection Time: 02/04/2017  2:35 PM  Result Value Ref Range   Color, Urine YELLOW YELLOW   APPearance CLEAR CLEAR   Specific Gravity, Urine 1.013 1.005 - 1.030   pH 6.0 5.0 - 8.0   Glucose, UA NEGATIVE NEGATIVE mg/dL   Hgb urine dipstick NEGATIVE NEGATIVE    Bilirubin Urine NEGATIVE NEGATIVE   Ketones, ur NEGATIVE NEGATIVE mg/dL   Protein, ur >=300 (A) NEGATIVE mg/dL   Nitrite NEGATIVE NEGATIVE   Leukocytes, UA TRACE (A) NEGATIVE   RBC / HPF 0-5 0 - 5 RBC/hpf   WBC, UA 6-30 0 - 5 WBC/hpf   Bacteria, UA NONE SEEN NONE SEEN   Squamous Epithelial / LPF 0-5 (A) NONE SEEN  I-stat troponin, ED     Status: None   Collection Time: 02/02/2017  3:50 PM  Result Value Ref Range   Troponin i, poc 0.01 0.00 - 0.08 ng/mL   Comment 3            Comment: Due to the release kinetics of cTnI, a negative result within the first hours of the onset of symptoms does not rule out myocardial infarction with certainty. If myocardial infarction is still suspected, repeat the test at appropriate intervals.   Glucose, capillary     Status: Abnormal   Collection Time: 02/07/17 12:18 AM  Result Value Ref Range   Glucose-Capillary 120 (H) 65 - 99 mg/dL  Glucose, capillary     Status: Abnormal   Collection Time: 02/07/17  4:31 AM  Result Value Ref Range   Glucose-Capillary 110 (H) 65 - 99 mg/dL  Basic metabolic panel     Status: Abnormal   Collection Time: 02/07/17  6:55 AM  Result Value Ref Range   Sodium 139 135 - 145 mmol/L   Potassium 4.2 3.5 - 5.1 mmol/L   Chloride 107 101 -  111 mmol/L   CO2 23 22 - 32 mmol/L   Glucose, Bld 100 (H) 65 - 99 mg/dL   BUN 25 (H) 6 - 20 mg/dL   Creatinine, Ser 2.28 (H) 0.44 - 1.00 mg/dL   Calcium 8.3 (L) 8.9 - 10.3 mg/dL   GFR calc non Af Amer 20 (L) >60 mL/min   GFR calc Af Amer 23 (L) >60 mL/min    Comment: (NOTE) The eGFR has been calculated using the CKD EPI equation. This calculation has not been validated in all clinical situations. eGFR's persistently <60 mL/min signify possible Chronic Kidney Disease.    Anion gap 9 5 - 15  CBC     Status: Abnormal   Collection Time: 02/07/17  6:55 AM  Result Value Ref Range   WBC 3.0 (L) 4.0 - 10.5 K/uL   RBC 3.49 (L) 3.87 - 5.11 MIL/uL   Hemoglobin 10.6 (L) 12.0 - 15.0 g/dL    HCT 33.5 (L) 36.0 - 46.0 %   MCV 96.0 78.0 - 100.0 fL   MCH 30.4 26.0 - 34.0 pg   MCHC 31.6 30.0 - 36.0 g/dL   RDW 14.5 11.5 - 15.5 %   Platelets 142 (L) 150 - 400 K/uL  Glucose, capillary     Status: Abnormal   Collection Time: 02/07/17  7:48 AM  Result Value Ref Range   Glucose-Capillary 108 (H) 65 - 99 mg/dL  Glucose, capillary     Status: None   Collection Time: 02/07/17 11:57 AM  Result Value Ref Range   Glucose-Capillary 93 65 - 99 mg/dL  Glucose, capillary     Status: None   Collection Time: 02/07/17  4:30 PM  Result Value Ref Range   Glucose-Capillary 73 65 - 99 mg/dL    Ct Abdomen Pelvis Wo Contrast  Result Date: 01/31/2017 CLINICAL DATA:  Generalize weakness with nausea vomiting and abdominal pain EXAM: CT ABDOMEN AND PELVIS WITHOUT CONTRAST TECHNIQUE: Multidetector CT imaging of the abdomen and pelvis was performed following the standard protocol without IV contrast. COMPARISON:  12/15/2015 FINDINGS: Lower chest: Lung bases demonstrate no acute consolidation or pleural effusion. Normal heart size. Hepatobiliary: No focal liver abnormality is seen. Status post cholecystectomy. No biliary dilatation. Pancreas: Unremarkable. No pancreatic ductal dilatation or surrounding inflammatory changes. Spleen: Normal in size without focal abnormality. Adrenals/Urinary Tract: Adrenal glands are within normal limits. Marked atrophy of the native kidneys. Negative for hydronephrosis. Scattered calcifications in the left kidney. Stable 3.5 cm cyst lower pole right kidney. Right lower quadrant transplanted kidney with embolization coils. Urinary bladder unremarkable Stomach/Bowel: Moderate enlargement of the stomach. Dilated loops of jejunum in the left upper quadrant, measuring up to 3.7 cm. Transition point visualized within the mid small bowel in the upper pelvis, just to the right of midline, series 3, image number 48. No significant bowel wall thickening. Distal small bowel and colon are  collapsed. Vascular/Lymphatic: Aortic atherosclerosis. No enlarged abdominal or pelvic lymph nodes. Reproductive: Status post hysterectomy. No adnexal masses. Other: Negative for free air. Small amount of free fluid in the pelvis an adjacent to the liver Musculoskeletal: Degenerative changes. No acute or suspicious bone lesion. IMPRESSION: 1. Multiple loops of dilated small bowel, mostly on the left side of the abdomen with transition point visualized in the proximal to mid small bowel, in the upper pelvis just to the right of midline ; the small bowel loops and colon distal to this are decompressed. Findings consistent with mechanical small bowel obstruction possibly due to adhesions or  internal hernia. 2. Small amount of free fluid in the abdomen and pelvis 3. Atrophy of the native kidneys. Right lower quadrant transplanted kidney. Electronically Signed   By: Donavan Foil M.D.   On: 01/24/2017 19:01   Dg Chest 2 View  Result Date: 01/30/2017 CLINICAL DATA:  Nausea and vomiting EXAM: CHEST  2 VIEW COMPARISON:  07/08/2016 FINDINGS: Normal heart size and stable mediastinal contours. There is no edema, consolidation, effusion, or pneumothorax. Linear opacities in the right upper lung, stable compared to priors, especially 12/12/2015, compatible with scarring. Stable nodular density overlapping the anterior left third rib. No acute osseous finding. Surgical clips at the thoracic inlet correlating with history of thyroidectomy. Distended loops of small bowel in the left upper quadrant, up to 4 cm in diameter. IMPRESSION: 1. No evidence of acute cardiopulmonary disease. Stable chest compared to prior. 2. Distended loops of bowel in the left upper quadrant, consider further abdominal imaging in this clinical setting. Electronically Signed   By: Monte Fantasia M.D.   On: 01/23/2017 15:27   Dg Abd 2 Views  Result Date: 02/07/2017 CLINICAL DATA:  Scratched Small bowel obstruction. EXAM: ABDOMEN - 2 VIEW COMPARISON:   CT 02/03/2017. FINDINGS: Surgical clips right upper quadrant. Multiple distended loops of small bowel again noted. Bowel distention has improved slightly . Findings consistent with persistent small-bowel obstruction with possible slight improved. Stool and air noted within the colon. No free air . Pelvic calcifications consistent phleboliths. Lumbar spine scoliosis concave left again noted. Degenerative changes lumbar spine and both hips. IMPRESSION: Findings consistent with small bowel obstruction with possible slight improvement in distention of small bowel. Stool and air noted within the colon. No free air. Electronically Signed   By: Marcello Moores  Register   On: 02/07/2017 08:47   ROS:   Review of Systems  Constitutional: Positive for malaise/fatigue. Negative for chills and fever.  Gastrointestinal: Positive for abdominal pain, nausea and vomiting. Negative for blood in stool, constipation, diarrhea and melena.  Neurological: Positive for tremors and weakness.  All other systems reviewed and are negative.  Blood pressure (!) 145/59, pulse 88, temperature 98.5 F (36.9 C), temperature source Oral, resp. rate (!) 22, height 5' 3"  (1.6 m), weight 38.6 kg (85 lb 1.6 oz), SpO2 97 %. General appearance: Frail, elderly adult female, NAD Eyes: Anicteric, conjunctiva pink, lids and lashes normal. PERRL.    Ears: Normal auricles and EACs. Nose. Dry blood in left nasal cavity. No active bleeding. Mouth: Normal mucosa. No lesion. Neck: No neck masses.  Trachea midline.  No thyromegaly/tenderness.  Large right CEA scar, appears well healing. Lymph: No cervical or supraclavicular lymphadenopathy. Skin: Warm and dry.  No jaundice.  No suspicious rashes or lesions. Neuro: Alert and awake.   Assessment/Plan: Left epistaxis - resolved after application of afrin. No other ENT intervention is needed at this time. Will monitor.  Keagan Anthis W Javon Snee 02/07/2017, 7:08 PM

## 2017-02-07 NOTE — Progress Notes (Signed)
CC:  Generalized weakness, nausea and vomiting  Subjective: No real improvement, she is still nauseated but not vomiting.  She does not have allot of distension but she says she is no better, no flatus.  Very anxious about NG placement.  She had it years ago.  Also anxious about being off her transplant meds.  Family says she was assured she would get anxiety meds before placement, if it became necessary.    Objective: Vital signs in last 24 hours: Temp:  [98.2 F (36.8 C)-98.7 F (37.1 C)] 98.7 F (37.1 C) (09/25 0003) Pulse Rate:  [80-102] 84 (09/25 0433) Resp:  [10-24] 20 (09/25 0433) BP: (147-168)/(69-100) 159/74 (09/25 0433) SpO2:  [97 %-100 %] 97 % (09/25 0433) Weight:  [38.6 kg (85 lb 1.6 oz)] 38.6 kg (85 lb 1.6 oz) (09/25 0003) Last BM Date: 02/04/17 1900 IV Voided x 1  No NG drainage recorded Afebrile, BP slightly elevated HR stable Creatinine is stable at 2.28; WBC down to 3K  H/H down, platelets down to 142 K 2 view film this AM:  Findings consistent with small bowel obstruction with possible slight improvement in distention of small bowel. Stool and air noted within the colon. No free air  Intake/Output from previous day: 09/24 0701 - 09/25 0700 In: 1900 [I.V.:900; IV Piggyback:1000] Out: -  Intake/Output this shift: No intake/output data recorded.  General appearance: alert, cooperative, no distress and nauseated and looks like she feels terrible. GI: soft, some distension, BS are hyperactive, discomfort uppper abdomen on exam.   Lab Results:   Recent Labs  01/23/2017 1030 02/07/17 0655  WBC 4.1 3.0*  HGB 12.4 10.6*  HCT 38.3 33.5*  PLT 180 142*    BMET  Recent Labs  01/23/2017 1030 02/07/17 0655  NA 138 139  K 4.3 4.2  CL 102 107  CO2 25 23  GLUCOSE 193* 100*  BUN 26* 25*  CREATININE 2.50* 2.28*  CALCIUM 8.6* 8.3*   PT/INR No results for input(s): LABPROT, INR in the last 72 hours.   Recent Labs Lab 01/16/2017 1030  AST 26  ALT  13*  ALKPHOS 138*  BILITOT 0.7  PROT 6.3*  ALBUMIN 4.2     Lipase     Component Value Date/Time   LIPASE 22 01/24/2017 1030     Medications: . aspirin EC  81 mg Oral QHS  . clonazePAM  1 mg Oral QHS  . enoxaparin (LOVENOX) injection  30 mg Subcutaneous Q24H  . fluconazole  200 mg Oral Daily  . [START ON Feb 11, 2017] Influenza vac split quadrivalent PF  0.5 mL Intramuscular Tomorrow-1000  . insulin aspart  0-9 Units Subcutaneous Q4H  . levothyroxine  75 mcg Oral QAC breakfast  . metoprolol tartrate  50 mg Oral BID  . mycophenolate  250 mg Oral BID  . pantoprazole (PROTONIX) IV  40 mg Intravenous Q24H  . sodium bicarbonate  1,300 mg Oral Q1200  . tacrolimus  1 mg Oral Daily  . traMADol  50 mg Oral Once   . sodium chloride 75 mL/hr at 02/05/2017 2111   Anti-infectives    Start     Dose/Rate Route Frequency Ordered Stop   02/07/17 0004  fluconazole (DIFLUCAN) tablet 200 mg     200 mg Oral Daily 02/07/17 0004       Assessment/Plan SBO s/p hysterectomy, appendectomy, open cholecystectomy, Kidney transplant, tubal ligation Renal transplant on Prograf and Cellcept  - 2004 Hypertension Hypothyroidism Diabetes - diet controlled PVD with recent  R Carotid endarterectomy 01/25/17 MAT Multiple medicine allergies FEN:  NPO/IV fluids ID:  Diflucan 02/07/17 =>> day 1 DVT:  Lovenox  PLAN:  I will talk with Medicine and Dr. Derrell Lolling, but I think we need to place and NG and see if we can get her further decompressed.  This is her first SBO.         LOS: 0 days    Kennedy Brines 02/07/2017 [phone]

## 2017-02-07 NOTE — Progress Notes (Signed)
Initial Nutrition Assessment  DOCUMENTATION CODES:   Severe malnutrition in context of chronic illness, Underweight  INTERVENTION:   -RD will follow for diet advancement and supplement as approprate  NUTRITION DIAGNOSIS:   Malnutrition related to chronic illness (CKD) as evidenced by energy intake < 75% for > or equal to 1 month, severe depletion of body fat, severe depletion of muscle mass, percent weight loss.  GOAL:   Patient will meet greater than or equal to 90% of their needs  MONITOR:   Weight trends, Labs, Skin, I & O's, Diet advancement  REASON FOR ASSESSMENT:   Malnutrition Screening Tool    ASSESSMENT:   [name] is a 76 y.o. 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 Myra Gianotti who presents with abdominal pain and vomiting.  Pt admitted with SBO.   Per general surgery notes, plan to place NGT today.   Spoke with pt and granddaughter at bedside. Pt shares that she has progressively lost weight over the past 2-3 years. She shares her UBW is around 134# and last weighed this about 2-3 years ago. She is unsure of how much weight she has lost over the past year; per wt hx, pt has experienced a 6.5% wt loss over the past month (which she suspects is related to right carotid endarterectomy).   Pt reports very poor intake related to early satiety. She usually consumes 2 meals per day (Breakfast: cereal, Dinner: egg sandwich). She used to consume Ensure supplements, but grew tired of them. She understands rationale for NPO diet order. She is amenable to try Boost Breeze supplements when diet is advanced.   Nutrition-Focused physical exam completed. Findings are severe fat depletion, severe muscle depletion, and no edema.   Labs reviewed: CBGS: 73-108 (inpatient glycemic control orders are 0-9 units insulin aspart every 4 hours).   Diet Order:  Diet NPO time  specified Except for: Sips with Meds, Ice Chips  Skin:   (closed rt neck incision)  Last BM:  02/07/17  Height:   Ht Readings from Last 1 Encounters:  02/07/17  (1.6 m)    Weight:   Wt Readings from Last 1 Encounters:  02/07/17 85 lb 1.6 oz (38.6 kg)    Ideal Body Weight:  52.3 kg  BMI:  Body mass index is 15.07 kg/m.  Estimated Nutritional Needs:   Kcal:  1350-1550  Protein:  65-80 grams  Fluid:  1.3-1.5 L  EDUCATION NEEDS:   Education needs addressed  Nadeem Romanoski A. Mayford Knife, RD, LDN, CDE Pager: [phone] After hours Pager: [phone]

## 2017-02-07 NOTE — Care Management Obs Status (Signed)
MEDICARE OBSERVATION STATUS NOTIFICATION   Patient Details  Name: [name] MRN: [id number] Date of Birth: 1941-04-20   Medicare Observation Status Notification Given:  Yes    Lawerance Sabal, RN 02/07/2017, 11:50 AM

## 2017-02-07 NOTE — Progress Notes (Addendum)
Called by nursing regarding nose bleed after NG Tube placement attempted.  Appears to be a posterior bleed as patient has vomited of BRB.  Afrin, Ice, pressure and packing have been ordered.  Have also placed call to on-call ENT - Suszanne Conners-  He will be by to see-- will get AFRIN up STAT and spray at least 3 times if not more to get the bleeding to stop.  Will check H/H (suspect Hgb will drop due to IVF and dehydration-- transfuse for < 7)  Marlin Canary DO

## 2017-02-08 ENCOUNTER — Inpatient Hospital Stay (HOSPITAL_COMMUNITY): Payer: Medicare Other

## 2017-02-08 DIAGNOSIS — N179 Acute kidney failure, unspecified: Secondary | ICD-10-CM

## 2017-02-08 DIAGNOSIS — R040 Epistaxis: Secondary | ICD-10-CM

## 2017-02-08 DIAGNOSIS — K56609 Unspecified intestinal obstruction, unspecified as to partial versus complete obstruction: Principal | ICD-10-CM

## 2017-02-08 DIAGNOSIS — A419 Sepsis, unspecified organism: Secondary | ICD-10-CM

## 2017-02-08 DIAGNOSIS — R579 Shock, unspecified: Secondary | ICD-10-CM

## 2017-02-08 DIAGNOSIS — E43 Unspecified severe protein-calorie malnutrition: Secondary | ICD-10-CM | POA: Insufficient documentation

## 2017-02-08 DIAGNOSIS — J9690 Respiratory failure, unspecified, unspecified whether with hypoxia or hypercapnia: Secondary | ICD-10-CM

## 2017-02-08 DIAGNOSIS — R6520 Severe sepsis without septic shock: Secondary | ICD-10-CM

## 2017-02-08 DIAGNOSIS — J9600 Acute respiratory failure, unspecified whether with hypoxia or hypercapnia: Secondary | ICD-10-CM

## 2017-02-08 LAB — COMPREHENSIVE METABOLIC PANEL
ALBUMIN: 3 g/dL — AB (ref 3.5–5.0)
ALK PHOS: 88 U/L (ref 38–126)
ALT: 12 U/L — ABNORMAL LOW (ref 14–54)
ANION GAP: 12 (ref 5–15)
AST: 27 U/L (ref 15–41)
BUN: 46 mg/dL — ABNORMAL HIGH (ref 6–20)
CHLORIDE: 109 mmol/L (ref 101–111)
CO2: 18 mmol/L — AB (ref 22–32)
Calcium: 7.7 mg/dL — ABNORMAL LOW (ref 8.9–10.3)
Creatinine, Ser: 3.08 mg/dL — ABNORMAL HIGH (ref 0.44–1.00)
GFR calc non Af Amer: 14 mL/min — ABNORMAL LOW (ref >60)
GFR, EST AFRICAN AMERICAN: 16 mL/min — AB (ref >60)
GLUCOSE: 111 mg/dL — AB (ref 65–99)
POTASSIUM: 3.8 mmol/L (ref 3.5–5.1)
SODIUM: 139 mmol/L (ref 135–145)
Total Bilirubin: 1 mg/dL (ref 0.3–1.2)
Total Protein: 4.7 g/dL — ABNORMAL LOW (ref 6.5–8.1)

## 2017-02-08 LAB — POCT I-STAT 3, ART BLOOD GAS (G3+)
Acid-base deficit: 12 mmol/L — ABNORMAL HIGH (ref 0.0–2.0)
Acid-base deficit: 13 mmol/L — ABNORMAL HIGH (ref 0.0–2.0)
Acid-base deficit: 17 mmol/L — ABNORMAL HIGH (ref 0.0–2.0)
Bicarbonate: 13.2 mmol/L — ABNORMAL LOW (ref 20.0–28.0)
Bicarbonate: 13.2 mmol/L — ABNORMAL LOW (ref 20.0–28.0)
Bicarbonate: 9.9 mmol/L — ABNORMAL LOW (ref 20.0–28.0)
O2 SAT: 83 %
O2 Saturation: 89 %
O2 Saturation: 99 %
PH ART: 7.235 — AB (ref 7.350–7.450)
Patient temperature: 100.5
TCO2: 14 mmol/L — ABNORMAL LOW (ref 22–32)
TCO2: 14 mmol/L — AB (ref 22–32)
TCO2: 11 mmol/L — AB (ref 22–32)
pCO2 arterial: 26.3 mmHg — ABNORMAL LOW (ref 32.0–48.0)
pCO2 arterial: 31.2 mmHg — ABNORMAL LOW (ref 32.0–48.0)
pCO2 arterial: 28 mmHg — ABNORMAL LOW (ref 32.0–48.0)
pH, Arterial: 7.312 — ABNORMAL LOW (ref 7.350–7.450)
pH, Arterial: 7.153 — CL (ref 7.350–7.450)
pO2, Arterial: 63 mmHg — ABNORMAL LOW (ref 83.0–108.0)
pO2, Arterial: 165 mmHg — ABNORMAL HIGH (ref 83.0–108.0)
pO2, Arterial: 57 mmHg — ABNORMAL LOW (ref 83.0–108.0)

## 2017-02-08 LAB — CBC WITH DIFFERENTIAL/PLATELET
BASOS PCT: 3 %
Basophils Absolute: 0 10*3/uL (ref 0.0–0.1)
EOS ABS: 0 10*3/uL (ref 0.0–0.7)
Eosinophils Relative: 2 %
HCT: 29.5 % — ABNORMAL LOW (ref 36.0–46.0)
HEMOGLOBIN: 9.3 g/dL — AB (ref 12.0–15.0)
LYMPHS ABS: 0.7 10*3/uL (ref 0.7–4.0)
Lymphocytes Relative: 57 %
MCH: 30 pg (ref 26.0–34.0)
MCHC: 31.5 g/dL (ref 30.0–36.0)
MCV: 95.2 fL (ref 78.0–100.0)
MONO ABS: 0.2 10*3/uL (ref 0.1–1.0)
Monocytes Relative: 13 %
Neutro Abs: 0.3 10*3/uL — ABNORMAL LOW (ref 1.7–7.7)
Neutrophils Relative %: 25 %
PLATELETS: 103 10*3/uL — AB (ref 150–400)
RBC: 3.1 MIL/uL — ABNORMAL LOW (ref 3.87–5.11)
RDW: 14.3 % (ref 11.5–15.5)
WBC: 1.2 10*3/uL — CL (ref 4.0–10.5)

## 2017-02-08 LAB — URINALYSIS, ROUTINE W REFLEX MICROSCOPIC
BILIRUBIN URINE: NEGATIVE
GLUCOSE, UA: NEGATIVE mg/dL
Ketones, ur: 20 mg/dL — AB
LEUKOCYTES UA: NEGATIVE
NITRITE: NEGATIVE
PROTEIN: 100 mg/dL — AB
Specific Gravity, Urine: 1.012 (ref 1.005–1.030)
pH: 6 (ref 5.0–8.0)

## 2017-02-08 LAB — CBC
HEMATOCRIT: 20.6 % — AB (ref 36.0–46.0)
Hemoglobin: 6.6 g/dL — CL (ref 12.0–15.0)
MCH: 30.4 pg (ref 26.0–34.0)
MCHC: 32 g/dL (ref 30.0–36.0)
MCV: 94.9 fL (ref 78.0–100.0)
Platelets: 111 10*3/uL — ABNORMAL LOW (ref 150–400)
RBC: 2.17 MIL/uL — ABNORMAL LOW (ref 3.87–5.11)
RDW: 14.6 % (ref 11.5–15.5)
WBC: 1.9 10*3/uL — AB (ref 4.0–10.5)

## 2017-02-08 LAB — GLUCOSE, CAPILLARY
GLUCOSE-CAPILLARY: 107 mg/dL — AB (ref 65–99)
GLUCOSE-CAPILLARY: 123 mg/dL — AB (ref 65–99)
GLUCOSE-CAPILLARY: 216 mg/dL — AB (ref 65–99)
Glucose-Capillary: 71 mg/dL (ref 65–99)
Glucose-Capillary: 201 mg/dL — ABNORMAL HIGH (ref 65–99)

## 2017-02-08 LAB — MRSA PCR SCREENING: MRSA by PCR: NEGATIVE

## 2017-02-08 LAB — TROPONIN I
TROPONIN I: 0.12 ng/mL — AB (ref <0.03)
Troponin I: 0.44 ng/mL

## 2017-02-08 LAB — LACTIC ACID, PLASMA
LACTIC ACID, VENOUS: 5.5 mmol/L — AB (ref 0.5–1.9)
Lactic Acid, Venous: 2.7 mmol/L (ref 0.5–1.9)
Lactic Acid, Venous: 4.6 mmol/L (ref 0.5–1.9)

## 2017-02-08 LAB — PREPARE RBC (CROSSMATCH)

## 2017-02-08 LAB — PROCALCITONIN: Procalcitonin: 46.03 ng/mL

## 2017-02-08 MED ORDER — CHLORHEXIDINE GLUCONATE 0.12% ORAL RINSE (MEDLINE KIT)
15.0000 mL | Freq: Two times a day (BID) | OROMUCOSAL | Status: DC
  Administered 2017-02-08: 15 mL via OROMUCOSAL

## 2017-02-08 MED ORDER — SODIUM CHLORIDE 0.9% FLUSH: 10.0000 mL | INTRAVENOUS | Status: DC | PRN

## 2017-02-08 MED ORDER — DEXTROSE 50 % IV SOLN
INTRAVENOUS | Status: AC
  Filled 2017-02-08: qty 50

## 2017-02-08 MED ORDER — LORAZEPAM BOLUS VIA INFUSION
2.0000 mg | INTRAVENOUS | Status: DC | PRN
  Filled 2017-02-08: qty 5

## 2017-02-08 MED ORDER — FENTANYL CITRATE (PF) 100 MCG/2ML IJ SOLN
100.0000 ug | Freq: Once | INTRAMUSCULAR | Status: AC
  Administered 2017-02-08: 100 ug via INTRAVENOUS

## 2017-02-08 MED ORDER — DEXTROSE 5 % IV SOLN
1.0000 g | Freq: Once | INTRAVENOUS | Status: AC
  Administered 2017-02-08: 1 g via INTRAVENOUS
  Filled 2017-02-08: qty 10

## 2017-02-08 MED ORDER — MIDAZOLAM HCL 2 MG/2ML IJ SOLN
2.0000 mg | INTRAMUSCULAR | Status: DC | PRN
  Filled 2017-02-08: qty 2

## 2017-02-08 MED ORDER — ACETAMINOPHEN 650 MG RE SUPP: 650.0000 mg | RECTAL | Status: DC | PRN

## 2017-02-08 MED ORDER — SODIUM BICARBONATE 8.4 % IV SOLN
INTRAVENOUS | Status: AC
  Filled 2017-02-08: qty 100

## 2017-02-08 MED ORDER — VASOPRESSIN 20 UNIT/ML IV SOLN
0.0300 [IU]/min | INTRAVENOUS | Status: DC
  Administered 2017-02-08: 0.03 [IU]/min via INTRAVENOUS
  Filled 2017-02-08: qty 2

## 2017-02-08 MED ORDER — FENTANYL CITRATE (PF) 100 MCG/2ML IJ SOLN
INTRAMUSCULAR | Status: AC
  Administered 2017-02-08: 100 ug
  Filled 2017-02-08: qty 2

## 2017-02-08 MED ORDER — MIDAZOLAM HCL 2 MG/2ML IJ SOLN
INTRAMUSCULAR | Status: AC
  Administered 2017-02-08: 2 mg
  Filled 2017-02-08: qty 2

## 2017-02-08 MED ORDER — PIPERACILLIN-TAZOBACTAM IN DEX 2-0.25 GM/50ML IV SOLN
2.2500 g | Freq: Three times a day (TID) | INTRAVENOUS | Status: DC
Start: 2017-02-08 — End: 2017-02-08
  Administered 2017-02-08: 2.25 g via INTRAVENOUS
  Filled 2017-02-08 (×2): qty 50

## 2017-02-08 MED ORDER — SODIUM CHLORIDE 0.9 % IV SOLN
Freq: Once | INTRAVENOUS | Status: AC
  Administered 2017-02-08: 15:00:00 via INTRAVENOUS

## 2017-02-08 MED ORDER — SODIUM CHLORIDE 0.9% FLUSH
10.0000 mL | Freq: Two times a day (BID) | INTRAVENOUS | Status: DC
  Administered 2017-02-08: 10 mL

## 2017-02-08 MED ORDER — DEXTROSE 5 % IV SOLN
INTRAVENOUS | Status: DC
  Administered 2017-02-08: 12:00:00 via INTRAVENOUS
  Filled 2017-02-08 (×3): qty 150

## 2017-02-08 MED ORDER — ORAL CARE MOUTH RINSE
15.0000 mL | Freq: Four times a day (QID) | OROMUCOSAL | Status: DC
  Administered 2017-02-08 (×2): 15 mL via OROMUCOSAL

## 2017-02-08 MED ORDER — SODIUM CHLORIDE 0.9 % IV BOLUS (SEPSIS)
1000.0000 mL | Freq: Once | INTRAVENOUS | Status: AC
  Administered 2017-02-08: 1000 mL via INTRAVENOUS

## 2017-02-08 MED ORDER — SODIUM CHLORIDE 0.9 % IV SOLN: 250.0000 mL | INTRAVENOUS | Status: DC | PRN

## 2017-02-08 MED ORDER — MIDAZOLAM HCL 2 MG/2ML IJ SOLN: 2.0000 mg | INTRAMUSCULAR | Status: DC | PRN

## 2017-02-08 MED ORDER — SODIUM CHLORIDE 0.9 % IV BOLUS (SEPSIS)
500.0000 mL | Freq: Once | INTRAVENOUS | Status: AC
  Administered 2017-02-08: 500 mL via INTRAVENOUS

## 2017-02-08 MED ORDER — FENTANYL BOLUS VIA INFUSION
50.0000 ug | INTRAVENOUS | Status: DC | PRN
  Filled 2017-02-08: qty 200

## 2017-02-08 MED ORDER — HYDROCORTISONE NA SUCCINATE PF 100 MG IJ SOLR
100.0000 mg | Freq: Three times a day (TID) | INTRAMUSCULAR | Status: DC
  Administered 2017-02-08: 100 mg via INTRAVENOUS
  Filled 2017-02-08 (×2): qty 2

## 2017-02-08 MED ORDER — ACETAMINOPHEN 325 MG PO TABS
650.0000 mg | ORAL_TABLET | ORAL | Status: DC | PRN
  Filled 2017-02-08: qty 2

## 2017-02-08 MED ORDER — NOREPINEPHRINE BITARTRATE 1 MG/ML IV SOLN
0.0000 ug/min | INTRAVENOUS | Status: DC
  Administered 2017-02-08 (×2): 40 ug/min via INTRAVENOUS
  Filled 2017-02-08 (×3): qty 16

## 2017-02-08 MED ORDER — NOREPINEPHRINE BITARTRATE 1 MG/ML IV SOLN
0.0000 ug/min | INTRAVENOUS | Status: DC
  Administered 2017-02-08: 20 ug/min via INTRAVENOUS
  Administered 2017-02-08: 10 ug/min via INTRAVENOUS
  Filled 2017-02-08 (×5): qty 4

## 2017-02-08 MED ORDER — FENTANYL CITRATE (PF) 100 MCG/2ML IJ SOLN: 50.0000 ug | Freq: Once | INTRAMUSCULAR | Status: DC

## 2017-02-08 MED ORDER — LORAZEPAM 2 MG/ML IJ SOLN
5.0000 mg/h | INTRAVENOUS | Status: DC
  Filled 2017-02-08: qty 25

## 2017-02-08 MED ORDER — PIPERACILLIN-TAZOBACTAM IN DEX 2-0.25 GM/50ML IV SOLN
2.2500 g | Freq: Three times a day (TID) | INTRAVENOUS | Status: DC
  Administered 2017-02-08: 2.25 g via INTRAVENOUS
  Filled 2017-02-08 (×2): qty 50

## 2017-02-08 MED ORDER — CHLORHEXIDINE GLUCONATE CLOTH 2 % EX PADS: 6.0000 | MEDICATED_PAD | Freq: Every day | CUTANEOUS | Status: DC

## 2017-02-08 MED ORDER — FENTANYL 2500MCG IN NS 250ML (10MCG/ML) PREMIX INFUSION: 100.0000 ug/h | INTRAVENOUS | Status: DC

## 2017-02-08 MED ORDER — SODIUM BICARBONATE 8.4 % IV SOLN
100.0000 meq | Freq: Once | INTRAVENOUS | Status: AC
Start: 2017-02-08 — End: 2017-02-08
  Administered 2017-02-08: 100 meq via INTRAVENOUS
  Filled 2017-02-08: qty 100

## 2017-02-08 MED ORDER — FENTANYL BOLUS VIA INFUSION
50.0000 ug | INTRAVENOUS | Status: DC | PRN
  Filled 2017-02-08: qty 50

## 2017-02-08 MED ORDER — MIDAZOLAM HCL 2 MG/2ML IJ SOLN: 2.0000 mg | Freq: Once | INTRAMUSCULAR | Status: DC

## 2017-02-08 MED ORDER — VANCOMYCIN HCL IN DEXTROSE 750-5 MG/150ML-% IV SOLN
750.0000 mg | Freq: Once | INTRAVENOUS | Status: AC
  Administered 2017-02-08: 750 mg via INTRAVENOUS
  Filled 2017-02-08: qty 150

## 2017-02-08 MED ORDER — FENTANYL CITRATE (PF) 100 MCG/2ML IJ SOLN
INTRAMUSCULAR | Status: AC
  Administered 2017-02-08: 100 ug via INTRAVENOUS
  Filled 2017-02-08: qty 2

## 2017-02-08 MED ORDER — ACETAMINOPHEN 650 MG RE SUPP
650.0000 mg | RECTAL | Status: DC | PRN
  Administered 2017-02-08: 650 mg via RECTAL
  Filled 2017-02-08: qty 1

## 2017-02-08 MED ORDER — FENTANYL 2500MCG IN NS 250ML (10MCG/ML) PREMIX INFUSION
25.0000 ug/h | INTRAVENOUS | Status: DC
  Administered 2017-02-08: 50 ug/h via INTRAVENOUS
  Filled 2017-02-08: qty 250

## 2017-02-09 LAB — BPAM RBC
BLOOD PRODUCT EXPIRATION DATE: 201810102359
Blood Product Expiration Date: 201810102359
ISSUE DATE / TIME: 201809261459
UNIT TYPE AND RH: 5100
Unit Type and Rh: 5100

## 2017-02-09 LAB — TYPE AND SCREEN
ABO/RH(D): O POS
Antibody Screen: NEGATIVE
UNIT DIVISION: 0
Unit division: 0

## 2017-02-09 LAB — PATHOLOGIST SMEAR REVIEW

## 2017-02-10 LAB — URINE CULTURE: Culture: 70000 — AB

## 2017-02-12 LAB — CULTURE, RESPIRATORY

## 2017-02-12 LAB — CULTURE, RESPIRATORY W GRAM STAIN

## 2017-02-13 LAB — CULTURE, BLOOD (ROUTINE X 2)
Culture: NO GROWTH
Culture: NO GROWTH
SPECIAL REQUESTS: ADEQUATE
SPECIAL REQUESTS: ADEQUATE

## 2017-02-13 NOTE — Progress Notes (Signed)
Time of death 105. Dr Marchelle Gearing notified at e-link; pupil fixed and dilated absent breath sounds and heart tone and pulses, verified by nurse Mpay Binam RN and Smiley Houseman.  Family at bedside notified and CDS form completed, bed control notified pt pending transfer to morgue.

## 2017-02-13 NOTE — Consult Note (Signed)
[facility] Renal Consultation Note  Requesting MD: Byrum Indication for Consultation: A on CRF- refractory metabolic acidosis  HPI:  [name] is a 76 y.o. 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 Mercy Moore. PAD status post fairly recent right CEA ( 9/12), and hypothyroidism. She presented to the hospital on 9/24 with symptoms of a small bowel obstruction.  Clinically was not improving and hospitalization was also complicated by epistaxis. Overnight, she developed fever and hypotension and is now been transferred to the intensive care unit.  She is on very high-dose pressors and is still hypotensive. She's developed a metabolic acidosis which is most likely lactate related. Most recent pH 7.14. Creatinine is 3 and baseline is around 2-1/2.  There is some urine output  Creatinine, Ser  Date/Time Value Ref Range Status  02/27/2017 04:08 AM 3.08 (H) 0.44 - 1.00 mg/dL Final  02/07/2017 06:55 AM 2.28 (H) 0.44 - 1.00 mg/dL Final  01/28/2017 10:30 AM 2.50 (H) 0.44 - 1.00 mg/dL Final  01/26/2017 05:20 AM 2.03 (H) 0.44 - 1.00 mg/dL Final  01/23/2017 11:43 AM 2.04 (H) 0.44 - 1.00 mg/dL Final  06/14/2016 08:30 AM 1.78 (H) 0.57 - 1.00 mg/dL Final  01/07/2016 04:43 AM 2.11 (H) 0.44 - 1.00 mg/dL Final  01/06/2016 05:29 AM 2.57 (H) 0.44 - 1.00 mg/dL Final  01/05/2016 11:56 AM 2.93 (H) 0.44 - 1.00 mg/dL Final  12/14/2015 05:22 AM 2.18 (H) 0.44 - 1.00 mg/dL Final  12/13/2015 05:44 AM 2.80 (H) 0.44 - 1.00 mg/dL Final  12/12/2015 12:29 PM 3.58 (H) 0.44 - 1.00 mg/dL Final  07/20/2015 10:30 AM 2.10 (H) 0.44 - 1.00 mg/dL Final  10/21/2014 04:05 AM 2.18 (H) 0.44 - 1.00 mg/dL Final  10/20/2014 03:48 AM 2.40 (H) 0.44 - 1.00 mg/dL Final  10/19/2014 05:26 AM 2.53 (H) 0.44 - 1.00 mg/dL Final  10/18/2014 04:15 PM 2.45 (H) 0.44 - 1.00 mg/dL Final  10/18/2014 03:32 AM 2.30 (H) 0.44 - 1.00 mg/dL Final   10/17/2014 05:26 AM 2.19 (H) 0.44 - 1.00 mg/dL Final  10/16/2014 08:35 PM 2.65 (H) 0.44 - 1.00 mg/dL Final  10/16/2014 04:35 PM 2.79 (H) 0.44 - 1.00 mg/dL Final  11/19/2009 1.81 mg/dL   05/03/2009 12:54 AM 1.89 (H) 0.4 - 1.2 mg/dL Final  05/03/2009 1.89 mg/dL   05/03/2009 1.89 mg/dL   03/26/2008 05:40 PM 2.06 (H)  Final  01/18/2007 09:26 AM 1.44 (H)  Final  12/21/2006 09:31 AM 1.77 (H)  Final  11/23/2006 09:30 AM 2.26 (H)  Final  10/23/2006 09:11 AM 2.08 (H)  Final     PMHx:   Past Medical History:  Diagnosis Date  . Arthritis   . Carotid artery stenosis   . Carotid artery stenosis    Right  . Chronic kidney disease    Kidney transplant  15 years ago  . Essential hypertension   . Frequent UTI   . GERD (gastroesophageal reflux disease)   . History of blood transfusion   . History of pneumonia   . History of renal transplantation    Follows with Dr. Mercy Moore  . Hyperlipemia   . Hypothyroidism   . Iron deficiency anemia   . 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 Shingles   . Type II diabetes mellitus (HCC)    no medications    Past Surgical History:  Procedure Laterality Date  . ABDOMINAL HYSTERECTOMY    .  APPENDECTOMY    . BACK SURGERY    . CHOLECYSTECTOMY OPEN    . ENDARTERECTOMY Right 01/25/2017   Procedure: ENDARTERECTOMY CAROTID-RIGHT;  Surgeon: Serafina Mitchell, MD;  Location: [facility] OR;  Service: Vascular;  Laterality: Right;  . KIDNEY SURGERY     multiple  . KIDNEY TRANSPLANT Right 2004   at [facility]  . LUMBAR DISC SURGERY     "L5"  . THYROIDECTOMY     1/2 removed  . TONSILLECTOMY    . TUBAL LIGATION      Family Hx:  Family History  Problem Relation Age of Onset  . Diabetes Mother   . Hypertension Mother   . Melanoma Mother   . Hyperlipidemia Mother   . Heart disease Father   . Varicose Veins Father   . Hypertension Sister   . Cancer Daughter   . Hypertension Son   . Hyperlipidemia Sister   . Hyperlipidemia Daughter   .  Hyperlipidemia Son   . Hypertension Daughter     Social History:  reports that she has never smoked. She has never used smokeless tobacco. She reports that she does not drink alcohol or use drugs.  Allergies:  Allergies  Allergen Reactions  . Codeine Nausea And Vomiting  . Elavil [Amitriptyline] Other (See Comments)    "makes legs hurt"  . Iodinated Diagnostic Agents Other (See Comments)    Renal transplant pt  . Lipitor [Atorvastatin] Other (See Comments)    Muscle pain   . Magnesium Hives    Pt tolerated oral and IV during 06/2014 admission   . Metoclopramide Other (See Comments)    Other reaction(s): Other (See Comments) "doesn't work"  . Morphine And Related Other (See Comments)    hallucinations   . Neurontin [Gabapentin] Other (See Comments)    Muscle pain   . Niacin And Related Other (See Comments)    Muscle pain   . Norvasc [Amlodipine Besylate] Other (See Comments)    Muscle pain  . Talwin [Pentazocine] Nausea Only and Other (See Comments)    hallucinations  . Ultram [Tramadol] Itching    Medications: Prior to Admission medications   Medication Sig Start Date End Date Taking? Authorizing Provider  aspirin 81 MG tablet Take 81 mg by mouth at bedtime.    Yes [provider]  Blood Glucose Monitoring Suppl (ACCU-CHEK AVIVA PLUS) w/Device KIT Use as directed 2 x daily 06/19/15  Yes Nida, Marella Chimes, MD  CELLCEPT 250 MG capsule Take 250 mg by mouth 2 (two) times daily. AT 0800 AND AT 2000 01/09/17  Yes [provider]  clonazePAM (KLONOPIN) 1 MG tablet Take 1 mg by mouth at bedtime.   Yes [provider]  Darbepoetin Alfa (ARANESP, ALBUMIN FREE,) 100 MCG/0.5ML SOSY injection Inject 100 mcg into the skin every 14 (fourteen) days. Last dose 01-18-17   Yes [provider]  dicyclomine (BENTYL) 10 MG capsule Take 10 mg by mouth 3 (three) times daily as needed for spasms.    Yes [provider]  diphenoxylate-atropine (LOMOTIL)  2.5-0.025 MG tablet Take 1 tablet by mouth 4 (four) times daily as needed for diarrhea or loose stools.   Yes [provider]  fluconazole (DIFLUCAN) 200 MG tablet Take 200 mg by mouth daily.  01/02/17  Yes [provider]  glucose blood (ACCU-CHEK AVIVA) test strip Use as instructed bid 06/29/15  Yes Nida, Marella Chimes, MD  levothyroxine (SYNTHROID, LEVOTHROID) 75 MCG tablet Take 1 tablet (75 mcg total) by mouth daily  before breakfast. 12/20/16  Yes Nida, Marella Chimes, MD  magnesium oxide (MAG-OX) 400 MG tablet Take 400 mg by mouth daily at 12 noon.    Yes [provider]  metoprolol (LOPRESSOR) 50 MG tablet Take 50 mg by mouth 2 (two) times daily.   Yes [provider]  oxyCODONE (OXY IR/ROXICODONE) 5 MG immediate release tablet Take 1 tablet (5 mg total) by mouth every 6 (six) hours as needed for moderate pain. 01/26/17  Yes Rhyne, Samantha J, PA-C  pantoprazole (PROTONIX) 40 MG tablet Take 40 mg by mouth 2 (two) times daily.  10/11/14  Yes [provider]  sodium bicarbonate 650 MG tablet Take 2 tablets (1,300 mg total) by mouth daily at 12 noon. 01/26/17  Yes Rhyne, Hulen Shouts, PA-C  tacrolimus (PROGRAF) 0.5 MG capsule Take 1 mg by mouth daily.    Yes [provider]    I have reviewed the patient's current medications.  Labs:  Results for orders placed or performed during the hospital encounter of 02/07/2017 (from the past 48 hour(s))  Urinalysis, Routine w reflex microscopic     Status: Abnormal   Collection Time: 01/25/2017  2:35 PM  Result Value Ref Range   Color, Urine YELLOW YELLOW   APPearance CLEAR CLEAR   Specific Gravity, Urine 1.013 1.005 - 1.030   pH 6.0 5.0 - 8.0   Glucose, UA NEGATIVE NEGATIVE mg/dL   Hgb urine dipstick NEGATIVE NEGATIVE   Bilirubin Urine NEGATIVE NEGATIVE   Ketones, ur NEGATIVE NEGATIVE mg/dL   Protein, ur >=300 (A) NEGATIVE mg/dL   Nitrite NEGATIVE NEGATIVE   Leukocytes, UA TRACE (A) NEGATIVE   RBC /  HPF 0-5 0 - 5 RBC/hpf   WBC, UA 6-30 0 - 5 WBC/hpf   Bacteria, UA NONE SEEN NONE SEEN   Squamous Epithelial / LPF 0-5 (A) NONE SEEN  I-stat troponin, ED     Status: None   Collection Time: 01/31/2017  3:50 PM  Result Value Ref Range   Troponin i, poc 0.01 0.00 - 0.08 ng/mL   Comment 3            Comment: Due to the release kinetics of cTnI, a negative result within the first hours of the onset of symptoms does not rule out myocardial infarction with certainty. If myocardial infarction is still suspected, repeat the test at appropriate intervals.   Glucose, capillary     Status: Abnormal   Collection Time: 02/07/17 12:18 AM  Result Value Ref Range   Glucose-Capillary 120 (H) 65 - 99 mg/dL  Glucose, capillary     Status: Abnormal   Collection Time: 02/07/17  4:31 AM  Result Value Ref Range   Glucose-Capillary 110 (H) 65 - 99 mg/dL  Basic metabolic panel     Status: Abnormal   Collection Time: 02/07/17  6:55 AM  Result Value Ref Range   Sodium 139 135 - 145 mmol/L   Potassium 4.2 3.5 - 5.1 mmol/L   Chloride 107 101 - 111 mmol/L   CO2 23 22 - 32 mmol/L   Glucose, Bld 100 (H) 65 - 99 mg/dL   BUN 25 (H) 6 - 20 mg/dL   Creatinine, Ser 2.28 (H) 0.44 - 1.00 mg/dL   Calcium 8.3 (L) 8.9 - 10.3 mg/dL   GFR calc non Af Amer 20 (L) >60 mL/min   GFR calc Af Amer 23 (L) >60 mL/min    Comment: (NOTE) The eGFR has been calculated using the CKD EPI equation. This calculation  has not been validated in all clinical situations. eGFR's persistently <60 mL/min signify possible Chronic Kidney Disease.    Anion gap 9 5 - 15  CBC     Status: Abnormal   Collection Time: 02/07/17  6:55 AM  Result Value Ref Range   WBC 3.0 (L) 4.0 - 10.5 K/uL   RBC 3.49 (L) 3.87 - 5.11 MIL/uL   Hemoglobin 10.6 (L) 12.0 - 15.0 g/dL   HCT 33.5 (L) 36.0 - 46.0 %   MCV 96.0 78.0 - 100.0 fL   MCH 30.4 26.0 - 34.0 pg   MCHC 31.6 30.0 - 36.0 g/dL   RDW 14.5 11.5 - 15.5 %   Platelets 142 (L) 150 - 400 K/uL  Glucose,  capillary     Status: Abnormal   Collection Time: 02/07/17  7:48 AM  Result Value Ref Range   Glucose-Capillary 108 (H) 65 - 99 mg/dL  Glucose, capillary     Status: None   Collection Time: 02/07/17 11:57 AM  Result Value Ref Range   Glucose-Capillary 93 65 - 99 mg/dL  Glucose, capillary     Status: None   Collection Time: 02/07/17  4:30 PM  Result Value Ref Range   Glucose-Capillary 73 65 - 99 mg/dL  Hemoglobin and hematocrit, blood     Status: Abnormal   Collection Time: 02/07/17  7:00 PM  Result Value Ref Range   Hemoglobin 9.3 (L) 12.0 - 15.0 g/dL   HCT 30.1 (L) 36.0 - 46.0 %  Type and screen [place]     Status: None   Collection Time: 02/07/17  7:00 PM  Result Value Ref Range   ABO/RH(D) O POS    Antibody Screen NEG    Sample Expiration 02/10/2017   Glucose, capillary     Status: None   Collection Time: 02/07/17  8:37 PM  Result Value Ref Range   Glucose-Capillary 85 65 - 99 mg/dL  Glucose, capillary     Status: Abnormal   Collection Time: 27-Feb-2017 12:04 AM  Result Value Ref Range   Glucose-Capillary 101 (H) 65 - 99 mg/dL  Urinalysis, Routine w reflex microscopic     Status: Abnormal   Collection Time: 02-27-17  3:28 AM  Result Value Ref Range   Color, Urine YELLOW YELLOW   APPearance CLEAR CLEAR   Specific Gravity, Urine 1.012 1.005 - 1.030   pH 6.0 5.0 - 8.0   Glucose, UA NEGATIVE NEGATIVE mg/dL   Hgb urine dipstick SMALL (A) NEGATIVE   Bilirubin Urine NEGATIVE NEGATIVE   Ketones, ur 20 (A) NEGATIVE mg/dL   Protein, ur 100 (A) NEGATIVE mg/dL   Nitrite NEGATIVE NEGATIVE   Leukocytes, UA NEGATIVE NEGATIVE   RBC / HPF 0-5 0 - 5 RBC/hpf   WBC, UA 0-5 0 - 5 WBC/hpf   Bacteria, UA RARE (A) NONE SEEN   Squamous Epithelial / LPF 0-5 (A) NONE SEEN   Hyaline Casts, UA PRESENT   CBC with Differential/Platelet     Status: Abnormal   Collection Time: 2017-02-27  4:08 AM  Result Value Ref Range   WBC 1.2 (LL) 4.0 - 10.5 K/uL    Comment: REPEATED TO  VERIFY SPECIMEN CHECKED FOR CLOTS CRITICAL RESULT CALLED TO, READ BACK BY AND VERIFIED WITH: J. DAVIGNON,RN 0500 02/27/2017 T. TYSOR    RBC 3.10 (L) 3.87 - 5.11 MIL/uL   Hemoglobin 9.3 (L) 12.0 - 15.0 g/dL   HCT 29.5 (L) 36.0 - 46.0 %   MCV 95.2 78.0 -  100.0 fL   MCH 30.0 26.0 - 34.0 pg   MCHC 31.5 30.0 - 36.0 g/dL   RDW 14.3 11.5 - 15.5 %   Platelets 103 (L) 150 - 400 K/uL    Comment: PLATELET COUNT CONFIRMED BY SMEAR   Neutrophils Relative % 25 %   Lymphocytes Relative 57 %   Monocytes Relative 13 %   Eosinophils Relative 2 %   Basophils Relative 3 %   Neutro Abs 0.3 (L) 1.7 - 7.7 K/uL   Lymphs Abs 0.7 0.7 - 4.0 K/uL   Monocytes Absolute 0.2 0.1 - 1.0 K/uL   Eosinophils Absolute 0.0 0.0 - 0.7 K/uL   Basophils Absolute 0.0 0.0 - 0.1 K/uL  Comprehensive metabolic panel     Status: Abnormal   Collection Time: 03/05/2017  4:08 AM  Result Value Ref Range   Sodium 139 135 - 145 mmol/L   Potassium 3.8 3.5 - 5.1 mmol/L   Chloride 109 101 - 111 mmol/L   CO2 18 (L) 22 - 32 mmol/L   Glucose, Bld 111 (H) 65 - 99 mg/dL   BUN 46 (H) 6 - 20 mg/dL   Creatinine, Ser 3.08 (H) 0.44 - 1.00 mg/dL   Calcium 7.7 (L) 8.9 - 10.3 mg/dL   Total Protein 4.7 (L) 6.5 - 8.1 g/dL   Albumin 3.0 (L) 3.5 - 5.0 g/dL   AST 27 15 - 41 U/L   ALT 12 (L) 14 - 54 U/L   Alkaline Phosphatase 88 38 - 126 U/L   Total Bilirubin 1.0 0.3 - 1.2 mg/dL   GFR calc non Af Amer 14 (L) >60 mL/min   GFR calc Af Amer 16 (L) >60 mL/min    Comment: (NOTE) The eGFR has been calculated using the CKD EPI equation. This calculation has not been validated in all clinical situations. eGFR's persistently <60 mL/min signify possible Chronic Kidney Disease.    Anion gap 12 5 - 15  Lactic acid, plasma     Status: Abnormal   Collection Time: 05-Mar-2017  4:18 AM  Result Value Ref Range   Lactic Acid, Venous 2.7 (HH) 0.5 - 1.9 mmol/L    Comment: CRITICAL RESULT CALLED TO, READ BACK BY AND VERIFIED WITH: J DAVIGNON,RN 0556 WILDERK    Glucose, capillary     Status: Abnormal   Collection Time: Mar 05, 2017  4:32 AM  Result Value Ref Range   Glucose-Capillary 107 (H) 65 - 99 mg/dL  Glucose, capillary     Status: None   Collection Time: 03/05/2017  6:26 AM  Result Value Ref Range   Glucose-Capillary 71 65 - 99 mg/dL   Comment 1 Notify RN   I-STAT 3, arterial blood gas (G3+)     Status: Abnormal   Collection Time: 05-Mar-2017  6:54 AM  Result Value Ref Range   pH, Arterial 7.312 (L) 7.350 - 7.450   pCO2 arterial 26.3 (L) 32.0 - 48.0 mmHg   pO2, Arterial 63.0 (L) 83.0 - 108.0 mmHg   Bicarbonate 13.2 (L) 20.0 - 28.0 mmol/L   TCO2 14 (L) 22 - 32 mmol/L   O2 Saturation 89.0 %   Acid-base deficit 12.0 (H) 0.0 - 2.0 mmol/L   Patient temperature 100.5 F    Collection site RADIAL, ALLEN'S TEST ACCEPTABLE    Drawn by RT    Sample type ARTERIAL   I-STAT 3, arterial blood gas (G3+)     Status: Abnormal   Collection Time: 03-05-2017  8:21 AM  Result Value Ref Range  pH, Arterial 7.235 (L) 7.350 - 7.450   pCO2 arterial 31.2 (L) 32.0 - 48.0 mmHg   pO2, Arterial 165.0 (H) 83.0 - 108.0 mmHg   Bicarbonate 13.2 (L) 20.0 - 28.0 mmol/L   TCO2 14 (L) 22 - 32 mmol/L   O2 Saturation 99.0 %   Acid-base deficit 13.0 (H) 0.0 - 2.0 mmol/L   Patient temperature 98.5 F    Collection site ARTERIAL LINE    Drawn by RT    Sample type ARTERIAL   Glucose, capillary     Status: Abnormal   Collection Time: 02-12-17  8:33 AM  Result Value Ref Range   Glucose-Capillary 123 (H) 65 - 99 mg/dL  Culture, respiratory (NON-Expectorated)     Status: None (Preliminary result)   Collection Time: 02-12-2017  9:25 AM  Result Value Ref Range   Specimen Description TRACHEAL ASPIRATE    Special Requests Immunocompromised    Gram Stain      FEW WBC PRESENT, PREDOMINANTLY PMN FEW GRAM NEGATIVE RODS RARE GRAM POSITIVE COCCI    Culture PENDING    Report Status PENDING   Lactic acid, plasma     Status: Abnormal   Collection Time: 02/12/2017  9:46 AM  Result Value  Ref Range   Lactic Acid, Venous 4.6 (HH) 0.5 - 1.9 mmol/L    Comment: CRITICAL RESULT CALLED TO, READ BACK BY AND VERIFIED WITH: F.MPAY,RN 1102 SPIKESN 09.26   Procalcitonin - Baseline     Status: None   Collection Time: February 12, 2017  9:46 AM  Result Value Ref Range   Procalcitonin 46.03 ng/mL    Comment:        Interpretation: PCT >= 10 ng/mL: Important systemic inflammatory response, almost exclusively due to severe bacterial sepsis or septic shock. (NOTE)         ICU PCT Algorithm               Non ICU PCT Algorithm    ----------------------------     ------------------------------         PCT < 0.25 ng/mL                 PCT < 0.1 ng/mL     Stopping of antibiotics            Stopping of antibiotics       strongly encouraged.               strongly encouraged.    ----------------------------     ------------------------------       PCT level decrease by               PCT < 0.25 ng/mL       >= 80% from peak PCT       OR PCT 0.25 - 0.5 ng/mL          Stopping of antibiotics                                             encouraged.     Stopping of antibiotics           encouraged.    ----------------------------     ------------------------------       PCT level decrease by              PCT >= 0.25 ng/mL       < 80% from peak PCT  AND PCT >= 0.5 ng/mL             Continuing antibiotics                                              encouraged.       Continuing antibiotics            encouraged.    ----------------------------     ------------------------------     PCT level increase compared          PCT > 0.5 ng/mL         with peak PCT AND          PCT >= 0.5 ng/mL             Escalation of antibiotics                                          strongly encouraged.      Escalation of antibiotics        strongly encouraged.   Troponin I     Status: Abnormal   Collection Time: 02/16/17  9:46 AM  Result Value Ref Range   Troponin I 0.12 (HH) <0.03 ng/mL    Comment: CRITICAL  RESULT CALLED TO, READ BACK BY AND VERIFIED WITH: M.PAY,RN 1114 02-16-17 SPIKESN   I-STAT 3, arterial blood gas (G3+)     Status: Abnormal   Collection Time: 02-16-17 11:10 AM  Result Value Ref Range   pH, Arterial 7.153 (LL) 7.350 - 7.450   pCO2 arterial 28.0 (L) 32.0 - 48.0 mmHg   pO2, Arterial 57.0 (L) 83.0 - 108.0 mmHg   Bicarbonate 9.9 (L) 20.0 - 28.0 mmol/L   TCO2 11 (L) 22 - 32 mmol/L   O2 Saturation 83.0 %   Acid-base deficit 17.0 (H) 0.0 - 2.0 mmol/L   Patient temperature 97.7 F    Sample type ARTERIAL   Glucose, capillary     Status: Abnormal   Collection Time: 16-Feb-2017 11:50 AM  Result Value Ref Range   Glucose-Capillary 201 (H) 65 - 99 mg/dL   Comment 1 Arterial Specimen    Comment 2 Notify RN      ROS:  Review of systems not obtained due to patient factors.  Physical Exam: Vitals:   16-Feb-2017 1120 02-16-17 1200  BP: (!) 80/50   Pulse: (!) 105 (!) 107  Resp: (!) 28 (!) 23  Temp: (!) 97.3 F (36.3 C) (!) 97.3 F (36.3 C)  SpO2: 100% 100%     General: sedated on ventilator HEENT: pupils are reactive Neck: has a well-healed incision for a right CEA- no JVD Heart: tachycardic Lungs: anteriorly clear Abdomen: distended, nontender patient is sedated Extremities: no peripheral edema Skin: warm and dry Neuro: sedated  Assessment/Plan: 76 year old white female status post renal transplant. She was admitted on 9/24 with a clinical small bowel obstruction. She's had clinical decompensation, now on in the intensive care unit intubated and on pressors with a metabolic acidosis and elevated lactate 1.Renal- acute on chronic renal failure. Patient has stage IV CKD baseline. Therefore, she is unable to tolerate an excessive acid load. Clinically, it appears that she may have ischemic bowel and is too unstable for surgery. Clinically she has not improved with measures taken  so far.  I did discuss with CCM. The only thing that I could offer his CRRT where we could  theoretically increase the amount of bicarbonate we can give her. However, if the underlying issue is not fixed it's unclear how much this will truly help the situation. What we decided was 2 continue the high-dose bicarbonate drip and reassess in a couple of hours to see if we fail that CRRT would be needed. According to the primary team patient expressed a wish to never be put back on dialysis so that is in play as well 2. Hypertension/volume  - hypotensive on increasing doses of pressors. Initially appeared dry but has been volume resuscitated 3 . Metabolic acidosis - likely secondary to lactate from ischemic bowel. Has not improved with measures to date and is too unstable for surgery.  CRRT would be something to due to theoretically increase the amount of bicarbonate given but given her clinical status I'm not sure it would help her overall prognosis which is poor   Ayansh Feutz A February 15, 2017, 12:42 PM

## 2017-02-13 NOTE — Progress Notes (Signed)
Fentanyl 150 mls wasted in sink verified by Bethann Berkshire RN and Karle Barr

## 2017-02-13 NOTE — Progress Notes (Signed)
CC: Generalized weakness, nausea and vomiting  Subjective: Pt has acutely decompensated since yesterday.  Even with CCM working to reverse her current status she continues to decline.  Objective: Vital signs in last 24 hours: Temp:  [97.3 F (36.3 C)-103 F (39.4 C)] 97.3 F (36.3 C) (09/26 1120) Pulse Rate:  [81-116] 105 (09/26 1120) Resp:  [18-33] 28 (09/26 1120) BP: (62-173)/(31-80) 80/50 (09/26 1120) SpO2:  [90 %-100 %] 100 % (09/26 1120) Arterial Line BP: (77-102)/(32-38) 77/32 (09/26 1100) FiO2 (%):  [60 %-100 %] 60 % (09/26 1120) Last BM Date: 02/07/17 NPO 750 IV Urine 200 Emesis 400 OG just started 450 ml  drained right away Stool x 1 yesterday Hypotensive, and on pressors, Tachycardic, Fever up to 102 last PM PH 7.153 PCO2 28, PO2 57 HCO3 9.9 VENT PRVC/rate 28/FiO2 60% Creatinine is going up, lactate is up WBC is 1.2/platelets dropping also  Intake/Output from previous day: 09/25 0701 - 09/26 0700 In: 750 [I.V.:750] Out: 600 [Urine:200; Emesis/NG output:400] Intake/Output this shift: Total I/O In: -  Out: 200 [Urine:200]  General appearance: sedated on the Vent, cannot really tell anything from the exam.   Resp: Full vent support, OG now in place and working. GI: some distension, with sedation on board hard to tell what she is feeling  Lab Results:   Recent Labs  02/07/17 0655 02/07/17 1900 23-Feb-2017 0408  WBC 3.0*  --  1.2*  HGB 10.6* 9.3* 9.3*  HCT 33.5* 30.1* 29.5*  PLT 142*  --  103*    BMET  Recent Labs  02/07/17 0655 02/23/2017 0408  NA 139 139  K 4.2 3.8  CL 107 109  CO2 23 18*  GLUCOSE 100* 111*  BUN 25* 46*  CREATININE 2.28* 3.08*  CALCIUM 8.3* 7.7*   PT/INR No results for input(s): LABPROT, INR in the last 72 hours.   Recent Labs Lab 01/29/2017 1030 02/23/2017 0408  AST 26 27  ALT 13* 12*  ALKPHOS 138* 88  BILITOT 0.7 1.0  PROT 6.3* 4.7*  ALBUMIN 4.2 3.0*     Lipase     Component Value Date/Time   LIPASE 22  01/23/2017 1030     Medications: . chlorhexidine gluconate (MEDLINE KIT)  15 mL Mouth Rinse BID  . Chlorhexidine Gluconate Cloth  6 each Topical Daily  . dextrose      . diatrizoate meglumine-sodium  90 mL Per NG tube Once  . fentaNYL (SUBLIMAZE) injection  50 mcg Intravenous Once  . Influenza vac split quadrivalent PF  0.5 mL Intramuscular Tomorrow-1000  . insulin aspart  0-9 Units Subcutaneous Q4H  . levothyroxine  37.5 mcg Intravenous Daily  . mouth rinse  15 mL Mouth Rinse QID  . midazolam  2 mg Intravenous Once  . oxymetazoline  3 spray Each Nare BID  . pantoprazole (PROTONIX) IV  40 mg Intravenous Q24H  . sodium bicarbonate      . sodium bicarbonate  1,300 mg Oral Q1200  . sodium chloride flush  10-40 mL Intracatheter Q12H   . sodium chloride 75 mL/hr at 02/07/17 1522  . sodium chloride    . fentaNYL infusion INTRAVENOUS 75 mcg/hr (02/23/2017 1000)  . fluconazole (DIFLUCAN) IV    . mycophenolate (CELLCEPT) IV 250 mg (02/23/2017 1033)  . norepinephrine (LEVOPHED) Adult infusion 40 mcg/min (23-Feb-2017 1101)  . piperacillin-tazobactam (ZOSYN)  IV    .  sodium bicarbonate  infusion 1000 mL 125 mL/hr at 02/23/17 1133  . 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  CCM is doing full court press to resuscitate and  stabilze her.  She is being seen by Renal also.  We will follow closely and if she becomes more stable consider surgery later today.  Dr. Dalbert Batman has seen the patient and has talked with some of the family.    LOS: 1 day    Sueo Cullen February 15, 2017 [phone]

## 2017-02-13 NOTE — Progress Notes (Signed)
PCCM Interval Note   Advised by patient's RN that family state they are ready to withdrawal life support and transition to comfort measures as previously discussed with Dr. Delton Coombes.  Confirmed this with large amount of family at bedside and Fayrene Fearing, Charity fundraiser.  Patient already a DNR.  Will proceed with comfort care/ withdrawal order set and extubation.   Posey Boyer, AGACNP-BC [facility] Pgr: [phone] or if no answer [phone] 02/05/2017, 5:55 PM

## 2017-02-13 NOTE — Progress Notes (Signed)
Pt BP 62/38. MD notified

## 2017-02-13 NOTE — Progress Notes (Signed)
PCCM Interval Note  I spoke with the patient's family regarding her overall status, underlying contributors to her MODS. She continues to have bloody gastric secretions. Receiving PRBC. Progressive renal dysfxn and acidosis. Progressive hypoxemia, likely due to worsening PNA. She is not a surgical candidate. She likely would not tolerate HD. I have explained that I do not believe she will survive this illness. I have recommended that we concentrate on her comfort, do not escalate her care from here. Defer CPR or other invasive interventions. The family agrees. I have told them that withdrawal of MV and presors would be very reasonable and that we should consider doing so. They agree with this also, will speak with extended family and let us know if / when they want to withdraw care. Code status changed to DNR.   Independent CC time 30 minutes  Levy Pupa, MD, PhD 01/25/2017, 3:46 PM [facility] [phone] or if no answer [phone]

## 2017-02-13 NOTE — Progress Notes (Signed)
At~ 1610 this RN into pt's room, pt warm, oral temp checked at 102.7, hr 112. This was the pt's first fever this admission. MD paged and made aware, BC ordered. PO tylenol  given. Oral temp rechecked at 0200 resulting at 103. MD paged and made aware, ordered for rectal tylenol and MD to bedside. Pt up to commode after not urinating for "10 hours" per the pt's daughters who stayed overnight. Pt urinating of yellow urine in the [facility]. When ambulating back to the bed, the pt became nauseated and vomitted dark red blood, ~300cc's of emesis. Pt medicated with prn phenegan. At 0434 pt's morning vitals checked. BP resulted at 62/40. Pt lethargic and slow to respond. MD made aware, order for 500cc bolus. This RN into room manual sbp checked at 60. Pt laid flat, rapid response called. MD, rapid team at bedside. Pt given a total of 1.5L of fluid. ICU and General surgery doctor into pt's room. Pt obtained orders for transfer. Report given at bedside. Family present throughout episode and updated throughout. WCTM

## 2017-02-13 NOTE — Progress Notes (Signed)
Rt attempted LR A line insertion but unable to pass catheter. Small hematoma noted at site and pressure held 5 min. CCM NP made aware.

## 2017-02-13 NOTE — Progress Notes (Signed)
Pt had decreased blood pressure.  Temperature decreased with tylenol.  Rn reports pt is more lethargic.   Iv fluid bolus ordered.  Pt re examined.  Pt is alert,  Bp increased to 70/40.  Iv fluids continued. Chest xray shows pneumonia.  Pt has received rocephin.  I consulted critical care.  Pt to be moved to a stepdown unit. I am concern about possible aspiration.  Pt is reported to have vomited several times after nose bleed.

## 2017-02-13 NOTE — Progress Notes (Signed)
Pt temp 103 oral, Tylenol given at 0105. Other VS stable, Pt alert and oriented x4. Keenan Bachelor, MD notified.

## 2017-02-13 NOTE — Procedures (Signed)
Extubation Procedure Note  Patient Details:   Name: [name] DOB: 07/20/1940 MRN: [id number]   Airway Documentation:     Evaluation  O2 sats: transiently fell during during procedure Complications: Complications of desating Patient did not tolerate procedure well. Bilateral Breath Sounds: Clear, Diminished   No  PT was extubated to room air  Extubation was a terminal extubation  [name], Duane Lope 02/12/2017, 6:20 PM

## 2017-02-13 NOTE — Progress Notes (Signed)
Subjective: No epistaxis overnight. Intubated this AM.  Objective: Vital signs in last 24 hours: Temp:  [98.2 F (36.8 C)-103 F (39.4 C)] 98.4 F (36.9 C) (09/26 1000) Pulse Rate:  [81-116] 106 (09/26 1000) Resp:  [18-33] 24 (09/26 1000) BP: (62-173)/(31-80) 85/47 (09/26 1000) SpO2:  [90 %-100 %] 98 % (09/26 1000) Arterial Line BP: (94-102)/(34-38) 94/34 (09/26 1000) FiO2 (%):  [100 %] 100 % (09/26 0714)  General appearance:Frail, elderly adult female. Intubated. On vent. Ears: Normal auricles and EACs. Nose. Dry blood in left nasal cavity. No active bleeding. Mouth: Normal mucosa. No lesion. ET tube in place. Neck:No neck masses. Trachea midline. No thyromegaly/tenderness. Large right CEA scar, appears well healing. Lymph nodes:No cervical or supraclavicular lymphadenopathy. Skin:Warm and dry. No jaundice. No suspicious rashes or lesions.   Recent Labs  02/07/17 0655 02/07/17 1900 03-03-17 0408  WBC 3.0*  --  1.2*  HGB 10.6* 9.3* 9.3*  HCT 33.5* 30.1* 29.5*  PLT 142*  --  103*    Recent Labs  02/07/17 0655 03/03/2017 0408  NA 139 139  K 4.2 3.8  CL 107 109  CO2 23 18*  GLUCOSE 100* 111*  BUN 25* 46*  CREATININE 2.28* 3.08*  CALCIUM 8.3* 7.7*    Medications:  I have reviewed the patient's current medications. Scheduled: . chlorhexidine gluconate (MEDLINE KIT)  15 mL Mouth Rinse BID  . Chlorhexidine Gluconate Cloth  6 each Topical Daily  . dextrose      . diatrizoate meglumine-sodium  90 mL Per NG tube Once  . fentaNYL (SUBLIMAZE) injection  50 mcg Intravenous Once  . Influenza vac split quadrivalent PF  0.5 mL Intramuscular Tomorrow-1000  . insulin aspart  0-9 Units Subcutaneous Q4H  . levothyroxine  37.5 mcg Intravenous Daily  . mouth rinse  15 mL Mouth Rinse QID  . midazolam  2 mg Intravenous Once  . oxymetazoline  3 spray Each Nare BID  . pantoprazole (PROTONIX) IV  40 mg Intravenous Q24H  . sodium bicarbonate  1,300 mg Oral Q1200  . sodium  chloride flush  10-40 mL Intracatheter Q12H   Continuous: . sodium chloride 75 mL/hr at 02/07/17 1522  . sodium chloride    . fentaNYL infusion INTRAVENOUS 75 mcg/hr (03/03/2017 1000)  . fluconazole (DIFLUCAN) IV    . mycophenolate (CELLCEPT) IV 250 mg (2017/03/03 1033)  . norepinephrine (LEVOPHED) Adult infusion    . piperacillin-tazobactam (ZOSYN)  IV 2.25 g (03-Mar-2017 0959)  . vancomycin 750 mg (03-03-2017 1000)  . vasopressin (PITRESSIN) infusion - *FOR SHOCK* 0.03 Units/min (03-03-2017 1015)   MDY:JWLKHV chloride, acetaminophen **OR** acetaminophen, diphenhydrAMINE, fentaNYL, midazolam, midazolam, morphine injection, ondansetron (ZOFRAN) IV, promethazine **OR** promethazine **OR** promethazine, sodium chloride flush  Assessment/Plan: Pt's left epistaxis has resolved. No other ENT intervention at this time. Will sign off.   LOS: 1 day   Jacqueline Spofford W [place] Schreiner 03-03-17, 10:53 AM

## 2017-02-13 NOTE — Procedures (Signed)
Arterial Catheter Insertion Procedure Note [name] [id number] May 29, 1940  Procedure: Insertion of Arterial Catheter  Indications: Blood pressure monitoring and Frequent blood sampling  Procedure Details Consent: Unable to obtain consent because of emergent medical necessity. Time Out: Verified patient identification, verified procedure, site/side was marked, verified correct patient position, special equipment/implants available, medications/allergies/relevent history reviewed, required imaging and test results available.  Performed  Maximum sterile technique was used including antiseptics, cap, gloves, gown, hand hygiene, mask and sheet. Skin prep: Chlorhexidine; local anesthetic administered 20 gauge catheter was inserted into right femoral artery using the Seldinger technique.  Evaluation Blood flow good; BP tracing good. Complications: No apparent complications.   [name] [name] ACNP [facility] Pager [phone] till 3 pm If no answer page [phone] Feb 09, 2017, 8:12 AM

## 2017-02-13 NOTE — Progress Notes (Signed)
PULMONARY / CRITICAL CARE MEDICINE   Name: [name] MRN: [id number] DOB: November 03, 1940    ADMISSION DATE:  01/24/2017 CONSULTATION DATE:  02/11/2017  REFERRING MD:  vann  CHIEF COMPLAINT:  Nausea  HISTORY OF PRESENT ILLNESS:   76 year old woman history of CK stage IV status post renal transplant status post recent right carotid endarterectomy admitted two days ago with small bowel obstruction. Tonight she developed fever of 103 and hypotension and critical care was consulted.  Her hospital course was complicated by profound left naris epistaxis failed NG tube insertion for small bowel obstruction which was treated with Afrin consultation in ENT. She is additionally being consulted by general surgery for SBO. General surgery was bedside at time of my history and physical. She complains of nausea and vomiting abdominal pain diffuse bloating.   SUBJECTIVE:  Intubated on pressors  VITAL SIGNS: BP (!) 73/40 (BP Location: Right Arm)   Pulse (!) 109   Temp (!) 100.5 F (38.1 C) (Oral)   Resp 18   Ht  (1.6 m)   Wt 85 lb 1.6 oz (38.6 kg)   SpO2 93%   BMI 15.07 kg/m   HEMODYNAMICS:    VENTILATOR SETTINGS:    INTAKE / OUTPUT: I/O last 3 completed shifts: In: 1650 [I.V.:1650] Out: 600 [Urine:200; Emesis/NG output:400]  PHYSICAL EXAMINATION: General:  Frail wasted female HEENT: dry, small oral orifice, new rt carotid endarectomy scar with scab still present WUJ:[id number] Neuro: sedated but maex4 CV:HSD RRR PULM: coarse rhonchi bilat WG:[id number], tender, decreased bs  Extremities: warm/dry, negedema  Skin: no rashes or lesions   LABS:  BMET  Recent Labs Lab 01/18/2017 1030 02/07/17 0655 02/11/17 0408  NA 138 139 139  K 4.3 4.2 3.8  CL 102 107 109  CO2 25 23 18*  BUN 26* 25* 46*  CREATININE 2.50* 2.28* 3.08*  GLUCOSE 193* 100* 111*    Electrolytes  Recent Labs Lab 02/05/2017 1030 02/07/17 0655 February 11, 2017 0408  CALCIUM 8.6* 8.3* 7.7*    CBC  Recent  Labs Lab 01/16/2017 1030 02/07/17 0655 02/07/17 1900 02-11-2017 0408  WBC 4.1 3.0*  --  1.2*  HGB 12.4 10.6* 9.3* 9.3*  HCT 38.3 33.5* 30.1* 29.5*  PLT 180 142*  --  103*    Coag's No results for input(s): APTT, INR in the last 168 hours.  Sepsis Markers  Recent Labs Lab 11-Feb-2017 0418  LATICACIDVEN 2.7*    ABG  Recent Labs Lab 02-11-2017 0654  PHART 7.312*  PCO2ART 26.3*  PO2ART 63.0*    Liver Enzymes  Recent Labs Lab 02/05/2017 1030 2017/02/11 0408  AST 26 27  ALT 13* 12*  ALKPHOS 138* 88  BILITOT 0.7 1.0  ALBUMIN 4.2 3.0*    Cardiac Enzymes No results for input(s): TROPONINI, PROBNP in the last 168 hours.  Glucose  Recent Labs Lab 02/07/17 1157 02/07/17 1630 02/07/17 2037 02-11-17 0004 2017/02/11 0432 02/11/17 0626  GLUCAP 93 73 85 101* 107* 71    Imaging Dg Abd 2 Views  Result Date: 02/07/2017 CLINICAL DATA:  Scratched Small bowel obstruction. EXAM: ABDOMEN - 2 VIEW COMPARISON:  CT 02/10/2017. FINDINGS: Surgical clips right upper quadrant. Multiple distended loops of small bowel again noted. Bowel distention has improved slightly . Findings consistent with persistent small-bowel obstruction with possible slight improved. Stool and air noted within the colon. No free air . Pelvic calcifications consistent phleboliths. Lumbar spine scoliosis concave left again noted. Degenerative changes lumbar spine and both hips. IMPRESSION: Findings consistent with small  bowel obstruction with possible slight improvement in distention of small bowel. Stool and air noted within the colon. No free air. Electronically Signed   By: Maisie Fus  Register   On: 02/07/2017 08:47   Dg Abd Acute W/chest  Result Date: 01/19/2017 CLINICAL DATA:  76 y/o  F; fever and upper abdominal pain. EXAM: DG ABDOMEN ACUTE W/ 1V CHEST COMPARISON:  02/07/2017 abdomen radiograph. 01/06/2016 chest radiograph. 11/24/2004 CT chest. FINDINGS: Aortic atherosclerosis calcification. Patchy opacities in the  lung bases. The stable multiple upper lobe pulmonary nodules from prior radiographs and CT of chest. Stable cardiac silhouette. No pneumoperitoneum. Stable dilatation of small bowel with fluid levels compatible with small bowel obstruction. The right upper quadrant cholecystectomy clips. Moderate rotatory dextrocurvature of lumbar spine. IMPRESSION: Bilateral lower lobe patchy opacities may represent atelectasis or pneumonia. Stable dilated small bowel compatible with small bowel obstruction. Electronically Signed   By: Mitzi Hansen M.D.   On: 01/30/2017 04:18     STUDIES:  EKG is concerning was new ST depressions specifically in the lateral leads  Chest x-ray has infiltrates at both bases which could be atelectasis or infiltrates  CULTURES: 9/26 blood culture>> 9/26 urine culture>> 9/26 resp cul>>  ANTIBIOTICS: 9/26 ceftriaxone x1 9/26 Zosyn 9/26 vancomycin 9/26 diflucan>>  SIGNIFICANT EVENTS: 9/24 admit with small bowel obstruction 9/25 epistaxis failed NG tube insertion 9/26 febrile, hypotensive, transferred to ICU 9/26 urgent intubation, cvl, aline  9/26 renal consulted  LINES/TUBES: 9/26 ET>> 9/26 LIJCVL>> 9/26 R Fem A line>>  DISCUSSION: Hypotension which could be septic shock for cardiac in nature due to abnormal EKG Abnormal EKG concerning for ischemia Small bowel obstruction Pulmonary infiltrates which could be aspiration pneumonitis, pneumonia, atelectasis 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    ASSESSMENT / PLAN:  PULMONARY A: Pulmonary infiltrates, Acute respiratory failure 9/26 and urgently intubated P:   Vent bundle Increase rr for metabolic acidosis with resp compensation Abx for presumed  aspiration BD's as needed  CARDIOVASCULAR A:  Abnormal EKG Hypotension rule out septic shock P:  Serial EKGs and cardiac enzymes Fluid resuscitation Norepinephrine Anticoagulation not a current option if going to surgery  RENAL Lab Results  Component Value Date   CREATININE 3.08 (H) 02/02/2017   CREATININE 2.28 (H) 02/07/2017   CREATININE 2.50 (H) 11-Feb-2017    Recent Labs Lab Feb 11, 2017 1030 02/07/17 0655 02/02/2017 0408  K 4.3 4.2 3.8      A:   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 surgery consulted Avoid further attempts at NG tube placement, OGT placed per NP post intubation Serial abdominal exams Proton  HEMATOLOGIC A:   Epistaxis P:  Resolved, continue with Afrin Place OGT  INFECTIOUS A:   Rule out septic shock in immune optimize patient P:   Vancomycin, Zosyn, fluconazole Urine culture, blood culture  ENDOCRINE CBG (last 3)   Recent Labs  02/11/2017 0004 02/09/2017 0432 02/05/2017 0626  GLUCAP 101* 107* 71     A:   Diabetes Hypothyroid   P:   Sliding  Scale insulin Levothyroxin IV Repeat cbg to confirm she's not hypoglycemic   NEUROLOGIC A:   Pain and nausea Sedate for tube tolerance P:   RASS goal: -1 Zofran morphine  FAMILY  - Updates: No family at bedside  - Inter-disciplinary family meet or Palliative Care meeting due by:  02/15/17  -CCT 70 min  Condition: Critical Prognosis: Guarded to poor based on functional status nutritional status Code Status: Full  Brett Canales Johnnie Goynes ACNP [facility] Pager [phone] till 3 pm If no answer page [phone] 01/19/2017, 8:17 AM

## 2017-02-13 NOTE — Significant Event (Signed)
Recent Labs Lab 02/03/2017 1030 02/07/17 0655 2017-02-25 0408  NA 138 139 139  K 4.3 4.2 3.8  CL 102 107 109  CO2 25 23 18*  BUN 26* 25* 46*  CREATININE 2.50* 2.28* 3.08*  GLUCOSE 193* 100* 111*    Recent Labs Lab 02/09/2017 1030 02/07/17 0655 02/07/17 1900 02-25-2017 0408  HGB 12.4 10.6* 9.3* 9.3*  HCT 38.3 33.5* 30.1* 29.5*  WBC 4.1 3.0*  --  1.2*  PLT 180 142*  --  103*   Ct Abdomen Pelvis Wo Contrast  Result Date: 02/04/2017 CLINICAL DATA:  Generalize weakness with nausea vomiting and abdominal pain EXAM: CT ABDOMEN AND PELVIS WITHOUT CONTRAST TECHNIQUE: Multidetector CT imaging of the abdomen and pelvis was performed following the standard protocol without IV contrast. COMPARISON:  12/15/2015 FINDINGS: Lower chest: Lung bases demonstrate no acute consolidation or pleural effusion. Normal heart size. Hepatobiliary: No focal liver abnormality is seen. Status post cholecystectomy. No biliary dilatation. Pancreas: Unremarkable. No pancreatic ductal dilatation or surrounding inflammatory changes. Spleen: Normal in size without focal abnormality. Adrenals/Urinary Tract: Adrenal glands are within normal limits. Marked atrophy of the native kidneys. Negative for hydronephrosis. Scattered calcifications in the left kidney. Stable 3.5 cm cyst lower pole right kidney. Right lower quadrant transplanted kidney with embolization coils. Urinary bladder unremarkable Stomach/Bowel: Moderate enlargement of the stomach. Dilated loops of jejunum in the left upper quadrant, measuring up to 3.7 cm. Transition point visualized within the mid small bowel in the upper pelvis, just to the right of midline, series 3, image number 48. No significant bowel wall thickening. Distal small bowel and colon are collapsed. Vascular/Lymphatic: Aortic atherosclerosis. No enlarged abdominal or pelvic lymph nodes. Reproductive: Status post hysterectomy. No adnexal masses. Other: Negative for free air. Small amount of free fluid  in the pelvis an adjacent to the liver Musculoskeletal: Degenerative changes. No acute or suspicious bone lesion. IMPRESSION: 1. Multiple loops of dilated small bowel, mostly on the left side of the abdomen with transition point visualized in the proximal to mid small bowel, in the upper pelvis just to the right of midline ; the small bowel loops and colon distal to this are decompressed. Findings consistent with mechanical small bowel obstruction possibly due to adhesions or internal hernia. 2. Small amount of free fluid in the abdomen and pelvis 3. Atrophy of the native kidneys. Right lower quadrant transplanted kidney. Electronically Signed   By: Jasmine Pang M.D.   On: 01/16/2017 19:01   Dg Chest 2 View  Result Date: 01/14/2017 CLINICAL DATA:  Nausea and vomiting EXAM: CHEST  2 VIEW COMPARISON:  07/08/2016 FINDINGS: Normal heart size and stable mediastinal contours. There is no edema, consolidation, effusion, or pneumothorax. Linear opacities in the right upper lung, stable compared to priors, especially 12/12/2015, compatible with scarring. Stable nodular density overlapping the anterior left third rib. No acute osseous finding. Surgical clips at the thoracic inlet correlating with history of thyroidectomy. Distended loops of small bowel in the left upper quadrant, up to 4 cm in diameter. IMPRESSION: 1. No evidence of acute cardiopulmonary disease. Stable chest compared to prior. 2. Distended loops of bowel in the left upper quadrant, consider further abdominal imaging in this clinical setting. Electronically Signed   By: Marnee Spring M.D.   On: 02/02/2017 15:27   Dg Chest Port 1 View  Result Date: 2017-02-25 CLINICAL DATA:  Status post central line and endotracheal tube placement EXAM: PORTABLE CHEST 1 VIEW COMPARISON:  Chest x-ray of Feb 25, 2017 FINDINGS: The  heart is normal in size. The pulmonary vascularity is not engorged. There is calcification in the wall of the aortic arch. The lungs  are adequately inflated. There is increased density at both bases with subtle air bronchograms visible. There may be a small left pleural effusion. There is no pneumothorax following left internal jugular venous catheter placement whose tip projects over the midportion of the SVC. The endotracheal tube tip projects 2.2 cm above the carina. The esophagogastric tube tip in proximal port lie within the stomach. IMPRESSION: No postprocedure complication following left internal jugular venous catheter placement. A good positioning of the endotracheal and esophagogastric tubes. Bibasilar pneumonia. Thoracic aortic atherosclerosis. Electronically Signed   By: David  [place] M.D.   On: 2017-02-11 08:49   Dg Abd 2 Views  Result Date: 02/07/2017 CLINICAL DATA:  Scratched Small bowel obstruction. EXAM: ABDOMEN - 2 VIEW COMPARISON:  CT 01/29/2017. FINDINGS: Surgical clips right upper quadrant. Multiple distended loops of small bowel again noted. Bowel distention has improved slightly . Findings consistent with persistent small-bowel obstruction with possible slight improved. Stool and air noted within the colon. No free air . Pelvic calcifications consistent phleboliths. Lumbar spine scoliosis concave left again noted. Degenerative changes lumbar spine and both hips. IMPRESSION: Findings consistent with small bowel obstruction with possible slight improvement in distention of small bowel. Stool and air noted within the colon. No free air. Electronically Signed   By: Maisie Fus  Register   On: 02/07/2017 08:47   Dg Abd Acute W/chest  Result Date: 02-11-17 CLINICAL DATA:  76 y/o  F; fever and upper abdominal pain. EXAM: DG ABDOMEN ACUTE W/ 1V CHEST COMPARISON:  02/07/2017 abdomen radiograph. 01/06/2016 chest radiograph. 11/24/2004 CT chest. FINDINGS: Aortic atherosclerosis calcification. Patchy opacities in the lung bases. The stable multiple upper lobe pulmonary nodules from prior radiographs and CT of chest. Stable cardiac  silhouette. No pneumoperitoneum. Stable dilatation of small bowel with fluid levels compatible with small bowel obstruction. The right upper quadrant cholecystectomy clips. Moderate rotatory dextrocurvature of lumbar spine. IMPRESSION: Bilateral lower lobe patchy opacities may represent atelectasis or pneumonia. Stable dilated small bowel compatible with small bowel obstruction. Electronically Signed   By: Mitzi Hansen M.D.   On: 02-11-2017 04:18   Lactic Acid, Venous    Component Value Date/Time   LATICACIDVEN 4.6 (HH) Feb 11, 2017 0946  arterial blood gases Temp:  [98.2 F (36.8 C)-103 F (39.4 C)] 98.4 F (36.9 C) (09/26 1000) Pulse Rate:  [81-116] 106 (09/26 1000) Resp:  [18-33] 24 (09/26 1000) BP: (62-173)/(31-80) 85/47 (09/26 1000) SpO2:  [90 %-100 %] 98 % (09/26 1000) Arterial Line BP: (94-102)/(34-38) 94/34 (09/26 1000) FiO2 (%):  [100 %] 100 % (09/26 0714)     Spoke at length with family. Per their wishes, no cpr or shock. Continue aggressive care for now but they are receptive to comfort care if all reasonable interventions have failed.   Brett Canales Gearld Kerstein ACNP [facility] Pager [phone] till 3 pm If no answer page [phone] 02-11-17, 11:41 AM

## 2017-02-13 NOTE — H&P (Signed)
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 Tonight she developed fever of 103 and hypotension and critical care was consulted.  Her hospital course was complicated by profound left naris epistaxis failed NG tube insertion for small bowel obstruction which was treated with Afrin consultation in ENT. She is additionally being consulted by general surgery for SBO. General surgery was bedside at time of my history and physical. She complains of nausea and vomiting abdominal pain diffuse bloating.  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  PAST SURGICAL HISTORY: She  has a past surgical history that includes Kidney transplant (Right, 2004); Kidney surgery; Thyroidectomy; Back surgery; Tonsillectomy; Appendectomy; Cholecystectomy open; Lumbar disc surgery; Abdominal hysterectomy; Tubal ligation; and Endarterectomy (Right, 01/25/2017).  Allergies  Allergen Reactions  . Codeine Nausea And Vomiting  . Elavil [Amitriptyline] Other (See Comments)    "makes legs hurt"  . Iodinated Diagnostic Agents Other (See Comments)    Renal transplant pt  . Lipitor [Atorvastatin] Other (See Comments)    Muscle pain   . Magnesium Hives    Pt tolerated oral and IV  during 06/2014 admission   . Metoclopramide Other (See Comments)    Other reaction(s): Other (See Comments) "doesn't work"  . Morphine And Related Other (See Comments)    hallucinations   . Neurontin [Gabapentin] Other (See Comments)    Muscle pain   . Niacin And Related Other (See Comments)    Muscle pain   . Norvasc [Amlodipine Besylate] Other (See Comments)    Muscle pain  . Talwin [Pentazocine] Nausea Only and Other (See Comments)    hallucinations  . Ultram [Tramadol] Itching    No current facility-administered medications on file prior to encounter.    Current Outpatient Prescriptions on File Prior to Encounter  Medication Sig  . aspirin 81 MG tablet Take 81 mg by mouth at bedtime.   . Blood Glucose Monitoring Suppl (ACCU-CHEK AVIVA PLUS) w/Device KIT Use as directed 2 x daily  . CELLCEPT 250 MG capsule Take 250 mg by mouth 2 (two) times daily. AT 0800 AND AT 2000  . clonazePAM (KLONOPIN) 1 MG tablet Take 1 mg by mouth at bedtime.  . Darbepoetin Alfa (ARANESP, ALBUMIN FREE,) 100 MCG/0.5ML SOSY injection Inject 100 mcg into the skin every 14 (fourteen) days. Last dose 01-18-17  . dicyclomine (BENTYL) 10 MG capsule Take 10 mg by mouth 3 (three) times daily as needed for spasms.   . diphenoxylate-atropine (LOMOTIL) 2.5-0.025 MG tablet Take 1 tablet by mouth 4 (four) times daily as needed for diarrhea or loose stools.  . fluconazole (DIFLUCAN) 200 MG tablet Take 200 mg by mouth daily.   Marland Kitchen glucose blood (ACCU-CHEK AVIVA) test strip Use as instructed bid  . levothyroxine (SYNTHROID, LEVOTHROID) 75 MCG tablet Take 1 tablet (75 mcg total) by mouth daily before breakfast.  . magnesium oxide (MAG-OX)  400 MG tablet Take 400 mg by mouth daily at 12 noon.   . metoprolol (LOPRESSOR) 50 MG tablet Take 50 mg by mouth 2 (two) times daily.  Marland Kitchen oxyCODONE (OXY IR/ROXICODONE) 5 MG immediate release tablet Take 1 tablet (5 mg total) by mouth every 6 (six) hours as needed for moderate pain.  .  pantoprazole (PROTONIX) 40 MG tablet Take 40 mg by mouth 2 (two) times daily.   . sodium bicarbonate 650 MG tablet Take 2 tablets (1,300 mg total) by mouth daily at 12 noon.  . tacrolimus (PROGRAF) 0.5 MG capsule Take 1 mg by mouth daily.     FAMILY HISTORY:  Her indicated that the status of her mother is unknown. She indicated that the status of her father is unknown.    SOCIAL HISTORY: She  reports that she has never smoked. She has never used smokeless tobacco. She reports that she does not drink alcohol or use drugs.  REVIEW OF SYSTEMS:   Fever, nausea, abdominal pain no diarrhea no flatus no chest pain no cough no sputum  SUBJECTIVE:  Nausea  VITAL SIGNS: BP (!) 73/40 (BP Location: Right Arm)   Pulse (!) 109   Temp (!) 100.5 F (38.1 C) (Oral)   Resp 18   Ht 5' 3"  (1.6 m)   Wt 85 lb 1.6 oz (38.6 kg)   SpO2 93%   BMI 15.07 kg/m   HEMODYNAMICS:    VENTILATOR SETTINGS:    INTAKE / OUTPUT: I/O last 3 completed shifts: In: 2650 [I.V.:1650; IV Piggyback:1000] Out: 400 [Emesis/NG output:400]  PHYSICAL EXAMINATION: General:  Appears chronically ill, well-nourished, underweight, uncomfortable Neuro:  Alert and oriented 2 nonfocal HEENT:  Right CEA scar does not appear infected Cardiovascular:  Tachycardic regular no murmur no clubbing cyanosis edema good capillary refill, room air sat 98% Lungs:  Crackles right base otherwise clear no wheezing Abdomen:  Absent bowel sounds, distended, soft, tender, voluntary guarding, no rigidity rebound tenderness Musculoskeletal:  No red warm swollen tender deformed joints Skin:  Warm dry surgical wound right neck no rash poor turgor  LABS:  BMET  Recent Labs Lab 02/10/2017 1030 02/07/17 0655 02-Mar-2017 0408  NA 138 139 139  K 4.3 4.2 3.8  CL 102 107 109  CO2 25 23 18*  BUN 26* 25* 46*  CREATININE 2.50* 2.28* 3.08*  GLUCOSE 193* 100* 111*    Electrolytes  Recent Labs Lab 02/09/2017 1030 02/07/17 0655 Mar 02, 2017 0408   CALCIUM 8.6* 8.3* 7.7*    CBC  Recent Labs Lab 01/20/2017 1030 02/07/17 0655 02/07/17 1900 02-Mar-2017 0408  WBC 4.1 3.0*  --  1.2*  HGB 12.4 10.6* 9.3* 9.3*  HCT 38.3 33.5* 30.1* 29.5*  PLT 180 142*  --  103*    Coag's No results for input(s): APTT, INR in the last 168 hours.  Sepsis Markers  Recent Labs Lab 03-02-2017 0418  LATICACIDVEN 2.7*    ABG No results for input(s): PHART, PCO2ART, PO2ART in the last 168 hours.  Liver Enzymes  Recent Labs Lab 02/02/2017 1030 2017-03-02 0408  AST 26 27  ALT 13* 12*  ALKPHOS 138* 88  BILITOT 0.7 1.0  ALBUMIN 4.2 3.0*    Cardiac Enzymes No results for input(s): TROPONINI, PROBNP in the last 168 hours.  Glucose  Recent Labs Lab 02/07/17 0748 02/07/17 1157 02/07/17 1630 02/07/17 2037 March 02, 2017 0004 Mar 02, 2017 0432  GLUCAP 108* 93 73 85 101* 107*    Imaging Dg Abd 2 Views  Result Date: 02/07/2017 CLINICAL DATA:  Scratched Small bowel obstruction. EXAM: ABDOMEN - 2 VIEW COMPARISON:  CT 02/02/2017. FINDINGS: Surgical clips right upper quadrant. Multiple distended loops of small bowel again noted. Bowel distention has improved slightly . Findings consistent with persistent small-bowel obstruction with possible slight improved. Stool and air noted within the colon. No free air . Pelvic calcifications consistent phleboliths. Lumbar spine scoliosis concave left again noted. Degenerative changes lumbar spine and both hips. IMPRESSION: Findings consistent with small bowel obstruction with possible slight improvement in distention of small bowel. Stool and air noted within the colon. No free air. Electronically Signed   By: Marcello Moores  Register   On: 02/07/2017 08:47   Dg Abd Acute W/chest  Result Date: Mar 04, 2017 CLINICAL DATA:  76 y/o  F; fever and upper abdominal pain. EXAM: DG ABDOMEN ACUTE W/ 1V CHEST COMPARISON:  02/07/2017 abdomen radiograph. 01/06/2016 chest radiograph. 11/24/2004 CT chest. FINDINGS: Aortic atherosclerosis  calcification. Patchy opacities in the lung bases. The stable multiple upper lobe pulmonary nodules from prior radiographs and CT of chest. Stable cardiac silhouette. No pneumoperitoneum. Stable dilatation of small bowel with fluid levels compatible with small bowel obstruction. The right upper quadrant cholecystectomy clips. Moderate rotatory dextrocurvature of lumbar spine. IMPRESSION: Bilateral lower lobe patchy opacities may represent atelectasis or pneumonia. Stable dilated small bowel compatible with small bowel obstruction. Electronically Signed   By: Kristine Garbe M.D.   On: 03-04-2017 04:18     STUDIES:  EKG is concerning was new ST depressions specifically in the lateral leads  Chest x-ray has infiltrates at both bases which could be atelectasis or infiltrates  CULTURES: 9/26 blood culture 9/26 urine culture  ANTIBIOTICS: 9/26 ceftriaxone x1 9/26 Zosyn 9/26 vancomycin  SIGNIFICANT EVENTS: 9/24 admit with small bowel obstruction 9/25 epistaxis failed NG tube insertion 9/26 febrile, hypotensive, transferred to ICU  LINES/TUBES:   DISCUSSION: Hypotension which could be septic shock for cardiac in nature due to abnormal EKG Abnormal EKG concerning for ischemia Small bowel obstruction Pulmonary infiltrates which could be aspiration pneumonitis, pneumonia, atelectasis Chronic kidney disease stage IV status post renal transplant Diabetes Hypothyroidism Carotid artery stenosis status post right CEA   ASSESSMENT / PLAN:  PULMONARY A: Pulmonary infiltrates, normal respiratory failure P:   Monitor respiratory status  CARDIOVASCULAR A:  Abnormal EKG Hypotension rule out septic shock P:  Serial EKGs and cardiac enzymes Fluid resuscitation Norepinephrine  RENAL A:   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 surgery consulted Avoid further attempts at NG tube placement Serial abdominal exams Proton  HEMATOLOGIC A:   Epistaxis P:  Resolved, continue with Afrin  INFECTIOUS A:   Rule out septic shock in immune optimize patient P:   Vancomycin, Zosyn, fluconazole Urine culture, blood culture  ENDOCRINE A:   Diabetes Hypothyroid   P:   Sliding Scale insulin Levothyroxin  NEUROLOGIC A:   Pain and nausea P:   RASS goal: 0 Zofran morphine  FAMILY  - Updates: Dictated at bedside  - Inter-disciplinary family meet or Palliative Care meeting due by:  02/15/17  Upon my evaluation, this patient had a high probability of imminent or life-threatening deterioration due to impairment of respiratory, cardiovascular, renal, gastrointestinal, hematologic systems  high complexity decision making to assess, manipulate, and support vital organ system failure including evaluation of shock, sepsis, normal chest x-ray, small bowel obstruction  I have personally  provided 45 minutes of critical care time exclusive of time spent on separately billable procedures and education. Time includes review and summation of previous medical record, laboratory data, radiology results, independent review of chest x-ray, EKG, coordination of care with rapid response, nurse practitioner, general surgery, and monitoring for potential decompensation. Interventions were performed as documented above.  Condition: Critical Prognosis: Guarded to poor based on functional status nutritional status Code Status: Full  Charlesetta Garibaldi, MD  [place] Pager: [phone]   03/10/17, 6:37 AM

## 2017-02-13 NOTE — Procedures (Signed)
Intubation Procedure Note [name] [id number] 1940-08-24  Procedure: Intubation Indications: Respiratory insufficiency  Procedure Details Consent: Unable to obtain consent because of emergent medical necessity. Time Out: Verified patient identification, verified procedure, site/side was marked, verified correct patient position, special equipment/implants available, medications/allergies/relevent history reviewed, required imaging and test results available.  Performed  MAC and 3 Medications:  Fentanyl  Etomidate Versed NMB    Evaluation Hemodynamic Status: Transient hypotension treated with pressors; O2 sats: stable throughout Patient's Current Condition: unstable Complications: No apparent complications Patient did tolerate procedure well. Chest X-ray ordered to verify placement.  CXR: pending.   Richardson Landry Minor ACNP [facility] Shape PCCM Pager [phone] till 3 pm If no answer page [phone] 2017-03-08, 7:30 AM

## 2017-02-13 NOTE — Progress Notes (Addendum)
Subjective: Fever to 103 and hypotension during the night Chest x-ray shows bibasilar densities either atelectasis or pneumonia SPO2 93% on room air, however Labs show WBC 1,200.  Hemoglobin 9.3.  Potassium 3.8.  Creatinine 3.08.  BUN 46.  Following multiple failed attempts to insert NG tube she developed nosebleeds which is now resolved following ENT intervention with Afrin spray Anticoagulants have been stopped She vomited small volumes of bloody fluid during the night but no large volume. No stools Critical care medicine at bedside and currently being transferred to to him for treatment of sepsis and presumed septic shock  Abdominal x-rays at 4 AM show some dilated small bowel but no free air.  Stomach has aerated but not massively distended  Objective: Vital signs in last 24 hours: Temp:  [98.2 F (36.8 C)-103 F (39.4 C)] 100.5 F (38.1 C) (09/26 0537) Pulse Rate:  [81-116] 109 (09/26 0537) Resp:  [18-22] 18 (09/26 0434) BP: (62-173)/(31-75) 73/40 (09/26 0537) SpO2:  [90 %-100 %] 93 % (09/26 0434) Last BM Date: 02/07/17  Intake/Output from previous day: 09/25 0701 - 09/26 0700 In: 750 [I.V.:750] Out: 600 [Urine:200; Emesis/NG output:400] Intake/Output this shift: Total I/O In: -  Out: 200 [Urine:200]  General appearance: awake but weak.  Looks very frail.  Skin a little cold but not diaphoretic. Resp: decreased breath sounds at bases. GI: abdomen is quite soft and minimally tender but distended.  No hernias noted.  Multiple scars.  No localizing findings.  Hypoactive bowel sounds.  Lab Results:   Recent Labs  02/07/17 0655 02/07/17 1900 February 23, 2017 0408  WBC 3.0*  --  1.2*  HGB 10.6* 9.3* 9.3*  HCT 33.5* 30.1* 29.5*  PLT 142*  --  103*   BMET  Recent Labs  02/07/17 0655 2017-02-23 0408  NA 139 139  K 4.2 3.8  CL 107 109  CO2 23 18*  GLUCOSE 100* 111*  BUN 25* 46*  CREATININE 2.28* 3.08*  CALCIUM 8.3* 7.7*   PT/INR No results for input(s):  LABPROT, INR in the last 72 hours. ABG No results for input(s): PHART, HCO3 in the last 72 hours.  Invalid input(s): PCO2, PO2  Studies/Results: Ct Abdomen Pelvis Wo Contrast  Result Date: 01/18/2017 CLINICAL DATA:  Generalize weakness with nausea vomiting and abdominal pain EXAM: CT ABDOMEN AND PELVIS WITHOUT CONTRAST TECHNIQUE: Multidetector CT imaging of the abdomen and pelvis was performed following the standard protocol without IV contrast. COMPARISON:  12/15/2015 FINDINGS: Lower chest: Lung bases demonstrate no acute consolidation or pleural effusion. Normal heart size. Hepatobiliary: No focal liver abnormality is seen. Status post cholecystectomy. No biliary dilatation. Pancreas: Unremarkable. No pancreatic ductal dilatation or surrounding inflammatory changes. Spleen: Normal in size without focal abnormality. Adrenals/Urinary Tract: Adrenal glands are within normal limits. Marked atrophy of the native kidneys. Negative for hydronephrosis. Scattered calcifications in the left kidney. Stable 3.5 cm cyst lower pole right kidney. Right lower quadrant transplanted kidney with embolization coils. Urinary bladder unremarkable Stomach/Bowel: Moderate enlargement of the stomach. Dilated loops of jejunum in the left upper quadrant, measuring up to 3.7 cm. Transition point visualized within the mid small bowel in the upper pelvis, just to the right of midline, series 3, image number 48. No significant bowel wall thickening. Distal small bowel and colon are collapsed. Vascular/Lymphatic: Aortic atherosclerosis. No enlarged abdominal or pelvic lymph nodes. Reproductive: Status post hysterectomy. No adnexal masses. Other: Negative for free air. Small amount of free fluid in the pelvis an adjacent to the liver Musculoskeletal: Degenerative changes.  No acute or suspicious bone lesion. IMPRESSION: 1. Multiple loops of dilated small bowel, mostly on the left side of the abdomen with transition point visualized in the  proximal to mid small bowel, in the upper pelvis just to the right of midline ; the small bowel loops and colon distal to this are decompressed. Findings consistent with mechanical small bowel obstruction possibly due to adhesions or internal hernia. 2. Small amount of free fluid in the abdomen and pelvis 3. Atrophy of the native kidneys. Right lower quadrant transplanted kidney. Electronically Signed   By: Jasmine Pang M.D.   On: 02/22/17 19:01   Dg Chest 2 View  Result Date: 02-22-17 CLINICAL DATA:  Nausea and vomiting EXAM: CHEST  2 VIEW COMPARISON:  07/08/2016 FINDINGS: Normal heart size and stable mediastinal contours. There is no edema, consolidation, effusion, or pneumothorax. Linear opacities in the right upper lung, stable compared to priors, especially 12/12/2015, compatible with scarring. Stable nodular density overlapping the anterior left third rib. No acute osseous finding. Surgical clips at the thoracic inlet correlating with history of thyroidectomy. Distended loops of small bowel in the left upper quadrant, up to 4 cm in diameter. IMPRESSION: 1. No evidence of acute cardiopulmonary disease. Stable chest compared to prior. 2. Distended loops of bowel in the left upper quadrant, consider further abdominal imaging in this clinical setting. Electronically Signed   By: Marnee Spring M.D.   On: Feb 22, 2017 15:27   Dg Abd 2 Views  Result Date: 02/07/2017 CLINICAL DATA:  Scratched Small bowel obstruction. EXAM: ABDOMEN - 2 VIEW COMPARISON:  CT Feb 22, 2017. FINDINGS: Surgical clips right upper quadrant. Multiple distended loops of small bowel again noted. Bowel distention has improved slightly . Findings consistent with persistent small-bowel obstruction with possible slight improved. Stool and air noted within the colon. No free air . Pelvic calcifications consistent phleboliths. Lumbar spine scoliosis concave left again noted. Degenerative changes lumbar spine and both hips. IMPRESSION:  Findings consistent with small bowel obstruction with possible slight improvement in distention of small bowel. Stool and air noted within the colon. No free air. Electronically Signed   By: Maisie Fus  Register   On: 02/07/2017 08:47   Dg Abd Acute W/chest  Result Date: 01/19/2017 CLINICAL DATA:  76 y/o  F; fever and upper abdominal pain. EXAM: DG ABDOMEN ACUTE W/ 1V CHEST COMPARISON:  02/07/2017 abdomen radiograph. 01/06/2016 chest radiograph. 11/24/2004 CT chest. FINDINGS: Aortic atherosclerosis calcification. Patchy opacities in the lung bases. The stable multiple upper lobe pulmonary nodules from prior radiographs and CT of chest. Stable cardiac silhouette. No pneumoperitoneum. Stable dilatation of small bowel with fluid levels compatible with small bowel obstruction. The right upper quadrant cholecystectomy clips. Moderate rotatory dextrocurvature of lumbar spine. IMPRESSION: Bilateral lower lobe patchy opacities may represent atelectasis or pneumonia. Stable dilated small bowel compatible with small bowel obstruction. Electronically Signed   By: Mitzi Hansen M.D.   On: 02/09/2017 04:18    Anti-infectives: Anti-infectives    Start     Dose/Rate Route Frequency Ordered Stop   01/29/2017 1100  fluconazole (DIFLUCAN) IVPB 200 mg     200 mg 100 mL/hr over 60 Minutes Intravenous Every 24 hours 02/07/17 1138     02/02/2017 0400  cefTRIAXone (ROCEPHIN) 1 g in dextrose 5 % 50 mL IVPB     1 g 100 mL/hr over 30 Minutes Intravenous  Once 01/27/2017 0329 01/28/2017 0552   02/07/17 0004  fluconazole (DIFLUCAN) tablet 200 mg  Status:  Discontinued     200  mg Oral Daily 02/07/17 0004 02/07/17 1137      Assessment/Plan:  SBO - s/p hysterectomy, appendectomy, open cholecystectomy, Kidney transplant, tubal ligation    Inability to place NG tube increases likelihood that she will come to laparotomy this admission    I do not sense that there is any clinical or radiographic evidence for bowel perforation  or ischemia,    The patient will be transferred to ICU for treatment of sepsis and hypotension.  I think once she is adequately resuscitated we will reassess and she will possibly require laparotomy---sooner if physical exam deteriorates     If she comes to surgery,Strongly consider placement of temporary gastrostomy tube due to difficulty in NG placement     Abdominal x-rays ordered for tomorrow morning.     We will follow closely   Fever and hypotension.  Suspect sepsis.  Possible pulmonary versus other source.  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

## 2017-02-13 NOTE — Progress Notes (Signed)
Rn reports pt had an increase in her temperature to 102.  Pt was given tylenol 650 po.  Pt's temp increased to 103.  Pt vomittd after tylenol.  Nurse reported dried blood.   Pt denies any current complains.  Family reports no cough.  She reports no increase in abdominal pain.  Pt had a nose bleed earlier from ng tube.  Pt has not had any further bleeding.    PE temp 103,  Heart rate 116.  Dried blood r nare,  Healing incision right neck, no sign of infection Lungs clear, Heart tachycardic, Abdomen is slightly distended, nontender on palpation.   I discussed pt with Dr. Maryfrances Bunnell.  Cbc, cmet, ua, blood culture x 2,  Urine culture ordered.  lactic acid and acute abdominal series ordered.  I will start Rocephin IV. Pt given tylenol 650 PR.

## 2017-02-13 NOTE — Progress Notes (Signed)
Pharmacy Antibiotic Note  [name] is a 76 y.o. female admitted on 02/12/2017 with SBO, now w/ concern for sepsis.  Pharmacy has been consulted for Vancocin and Zosyn dosing.  Pt w/ AKI, baseline SCr ~1.8, now 3.08.  Plan: Vancomycin  IV x1 and f/u SCr prior to redosing.  Goal trough 15-20 mcg/mL. Zosyn 2.25g IV Q8H.  Height:  (160 cm) Weight: 85 lb 1.6 oz (38.6 kg) IBW/kg (Calculated) : 52.4  Temp (24hrs), Avg:100.4 F (38 C), Min:98.2 F (36.8 C), Max:103 F (39.4 C)   Recent Labs Lab 01/15/2017 1030 02/07/17 0655 24-Feb-2017 0408 2017-02-24 0418  WBC 4.1 3.0* 1.2*  --   CREATININE 2.50* 2.28* 3.08*  --   LATICACIDVEN  --   --   --  2.7*    Estimated Creatinine Clearance: 9.8 mL/min (A) (by C-G formula based on SCr of 3.08 mg/dL (H)).    Allergies  Allergen Reactions  . Codeine Nausea And Vomiting  . Elavil [Amitriptyline] Other (See Comments)    "makes legs hurt"  . Iodinated Diagnostic Agents Other (See Comments)    Renal transplant pt  . Lipitor [Atorvastatin] Other (See Comments)    Muscle pain   . Magnesium Hives    Pt tolerated oral and IV during 06/2014 admission   . Metoclopramide Other (See Comments)    Other reaction(s): Other (See Comments) "doesn't work"  . Morphine And Related Other (See Comments)    hallucinations   . Neurontin [Gabapentin] Other (See Comments)    Muscle pain   . Niacin And Related Other (See Comments)    Muscle pain   . Norvasc [Amlodipine Besylate] Other (See Comments)    Muscle pain  . Talwin [Pentazocine] Nausea Only and Other (See Comments)    hallucinations  . Ultram [Tramadol] Itching     Thank you for allowing pharmacy to be a part of this patient's care.  Vernard Gambles, PharmD, BCPS  Feb 24, 2017 6:23 AM

## 2017-02-13 NOTE — Procedures (Signed)
Central Venous Catheter Insertion Procedure Note AZAYLEA MAVES [id number] November 23, 1940  Procedure: Insertion of Central Venous Catheter Indications: Assessment of intravascular volume, Drug and/or fluid administration and Frequent blood sampling  Procedure Details Consent: Unable to obtain consent because of emergent medical necessity. Time Out: Verified patient identification, verified procedure, site/side was marked, verified correct patient position, special equipment/implants available, medications/allergies/relevent history reviewed, required imaging and test results available.  Performed  Maximum sterile technique was used including antiseptics, cap, gloves, gown, hand hygiene, mask and sheet. Skin prep: Chlorhexidine; local anesthetic administered A antimicrobial bonded/coated triple lumen catheter was placed in the left internal jugular vein using the Seldinger technique. Ultrasound guidance used.Yes.   Catheter placed to 20 cm. Blood aspirated via all 3 ports and then flushed x 3. Line sutured x 2 and dressing applied.  Evaluation Blood flow good Complications: No apparent complications Patient did tolerate procedure well. Chest X-ray ordered to verify placement.  CXR: pending.  Brett Canales Demario Faniel ACNP [facility] PCCM Pager [phone] till 3 pm If no answer page [phone] 01/24/2017, 7:32 AM

## 2017-02-13 NOTE — Progress Notes (Addendum)
Called by RN about low BPs, [facility] PA was with me with another call, we both went to 5W to see patient.  Upon arrival SBP upper 50s- low 70s, pale, HR low 100s, has been febrile, + PNA, already has SBO, sleepy but awakens to voice denies pain, no distress from a respiratory standpoint.  Patient was already started with 500cc NS bolus when we arrived, we added a second 500cc NS as well.  [facility] PA coverage did call PCCM to see the patient, when they arrived so did General Surgery MD.  BP did not improve and patient will be moved to 51M-14. Did have bleeding from traumatic NG tube insertions yesterday, patient has vomited, could have aspirated.   Once in the ICU, patient's work of breathing increased, needed pressor support, day time CC team came to bedside.   Family was update by providers  Start Time 505  End 705

## 2017-02-13 DEATH — deceased

## 2017-02-15 ENCOUNTER — Other Ambulatory Visit (HOSPITAL_COMMUNITY): Payer: Medicare Other

## 2017-02-15 ENCOUNTER — Ambulatory Visit (HOSPITAL_COMMUNITY): Payer: Medicare Other

## 2017-02-15 ENCOUNTER — Telehealth: Payer: Self-pay

## 2017-02-15 NOTE — Telephone Encounter (Signed)
On 02/15/17 I received a d/c from [facility] (original). The d/c is for burial. The patient is a patient of Doctor Byrum. The d/c will be taken to Pulmonary Unit @ [facility] this pm for signature.

## 2017-02-17 NOTE — Telephone Encounter (Signed)
02/17/17 received D/C back from Dr. Delton Coombes called Lissa Hoard and [facility] to pick up [phone] pwr

## 2017-03-16 NOTE — Discharge Summary (Signed)
PULMONARY / CRITICAL CARE MEDICINE DEATH SUMMARY   Name: [name] MRN: [id number] DOB: [birth year]    ADMISSION DATE:  03/07/2017 CONSULTATION DATE:  03/09/2017 DATE OF DEATH: Mar 09, 2017  REFERRING MD:  Dr Benjamine Mola  CHIEF COMPLAINT:  Nausea  FINAL CAUSE OF DEATH Septic Shock  SECONDARY CAUSES OF DEATH:  Acute respiratory failure with hypoxemia ARDS Klebsiella and E. coli aspiration pneumonia  Small bowel obstruction Possible bowel ischemia or perforation Acute on chronic (stage IV) renal failure Stress (type 2) NSTEMI Acute anion gap metabolic acidosis, lactic acidosis Hx of renal transplant  Immunosuppressed state with leukopenia Epistaxis  Acute blood loss anemia Diabetes  Hypothyroidism Acute encephalopathy, toxic metabolic Protein calorie malnutrition Hx Hypertension Hx Atrial fibrillation Hx renal transplant    Hospital course:   76 year old woman history of CKD stage IV status post renal transplant on immunosuppression, recent right carotid endarterectomy. She was admitted March 07, 2017 with small bowel obstruction. This was treated conservatively. She experienced severe epistaxis in setting NGT placement, was evaluated by ENT. She experienced fever, progressive respiratory distress, septic shock in setting evolving bilateral pulmonary infiltrates. It was suspected that she aspirated blood and developed HCAP. Her resp cx's ultimately grew out E coli and Klebsiella. She required intubation and was started on broad abx. IVF resuscitation and pressors initiated for septic shock. Course complicated by acute on chronic renal failure and metabolic acidosis. Her abdominal distension worsened and her exam was consistent with possible bowel ischemia, either as the cause of or a result of her decompensation. Renal consultation and surgical consultation were obtained, but it was felt that she would tolerate neither HD nor exploratory laparotomy. Unfortunately she did not stabilize to a  place where either intervention was a consideration. She developed progression of MODS. Discussions were undertaken with her family to explain her active injuries and her prognosis. Based on this decision was made to transition her to comfort based approach. She was extubated for comfort on Mar 10, 2023 and expired later that evening.    Levy Pupa, MD, PhD 02/24/2017, 2:47 PM [facility] [phone] or if no answer [phone]

## 2017-06-22 ENCOUNTER — Ambulatory Visit: Payer: Medicare Other | Admitting: "Endocrinology

## 2017-07-31 IMAGING — US US RENAL TRANSPLANT
2 series · 13 of 25 positions shown · non-contrast
Comparison: None.

CLINICAL DATA: Recurrent urinary tract infections and history of
prior renal transplantation.

EXAM:
ULTRASOUND OF RENAL TRANSPLANT WITH RENAL DOPPLER ULTRASOUND
TECHNIQUE: Ultrasound examination of the renal transplant was performed with
gray-scale, color and duplex doppler evaluation.

[Series 1: us renal transplant · 0.21mm/px · 10 of 82 slices shown (1 of 2)]
[im 1/82]
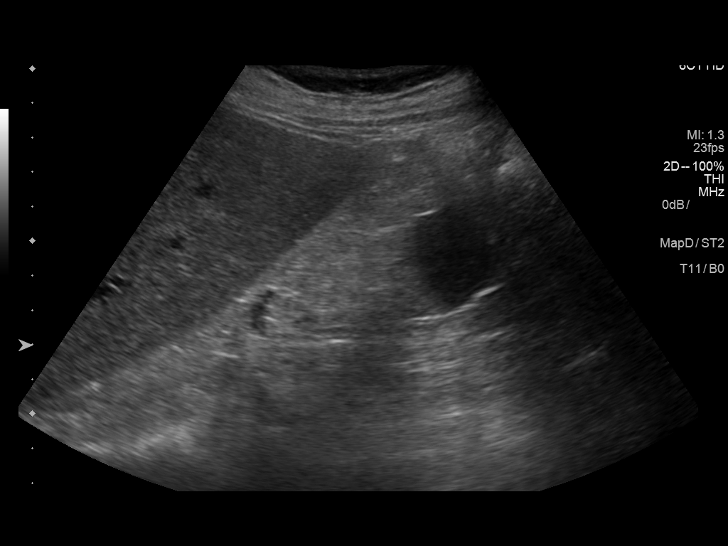
[im 10/82]
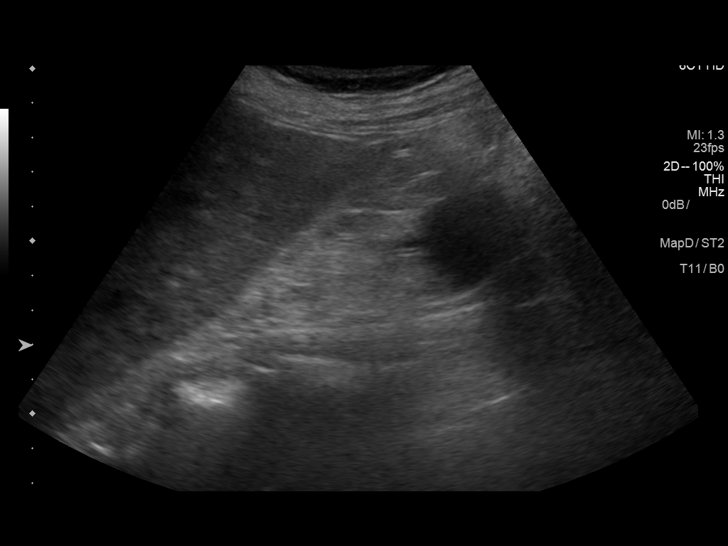
[im 19/82]
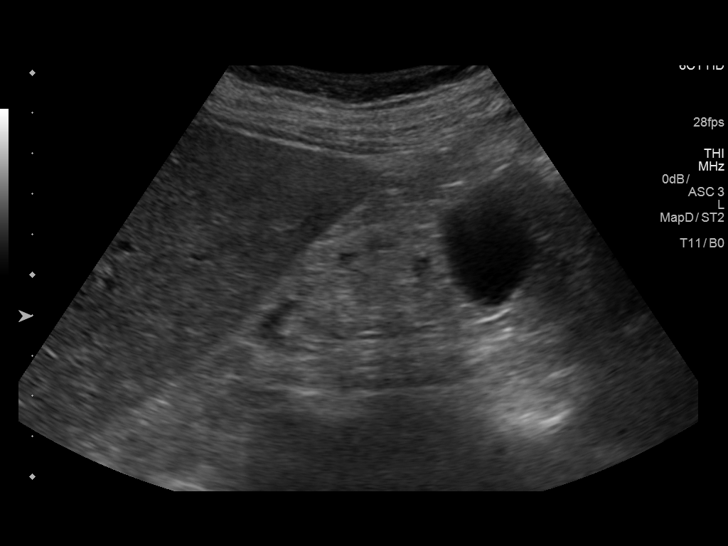
[im 28/82]
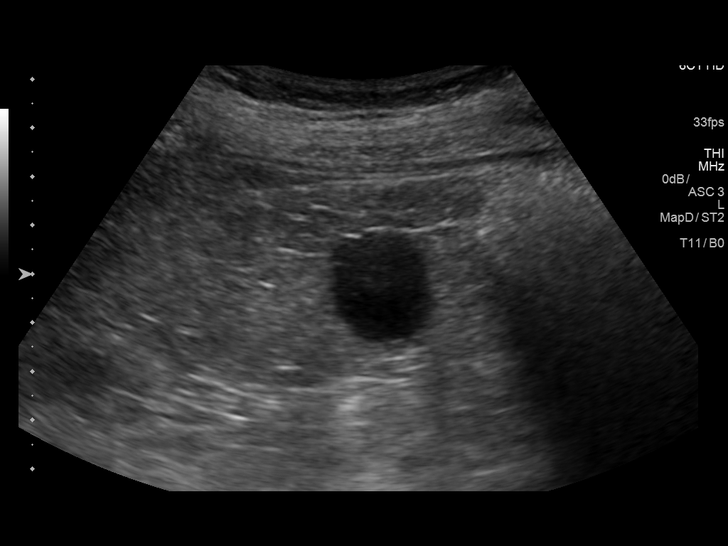
[im 37/82]
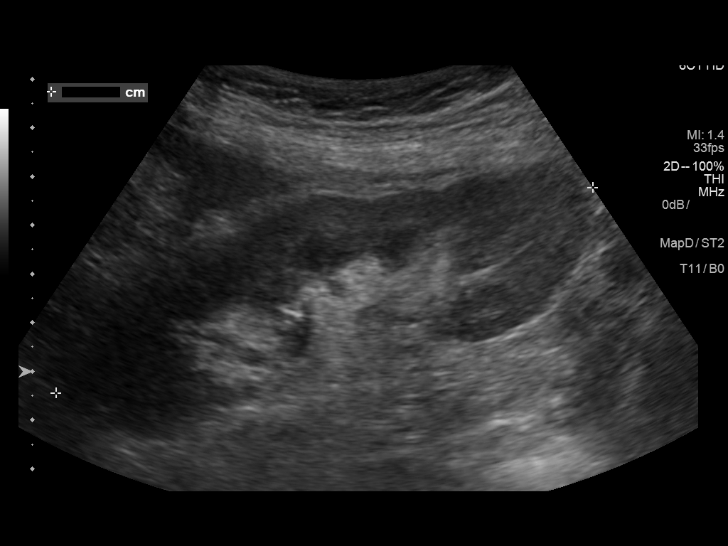
[im 46/82]
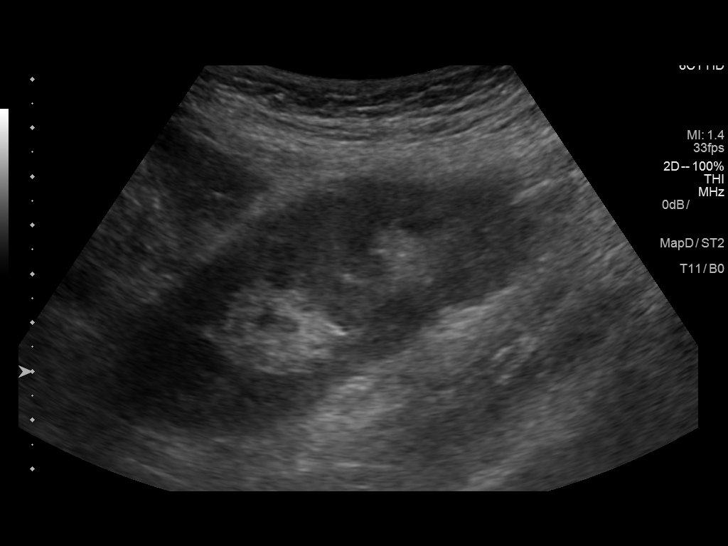
[im 55/82]
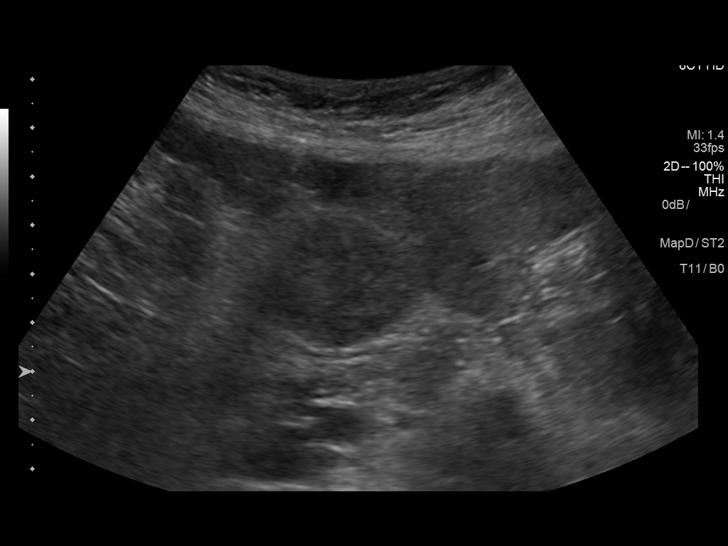
[im 64/82]
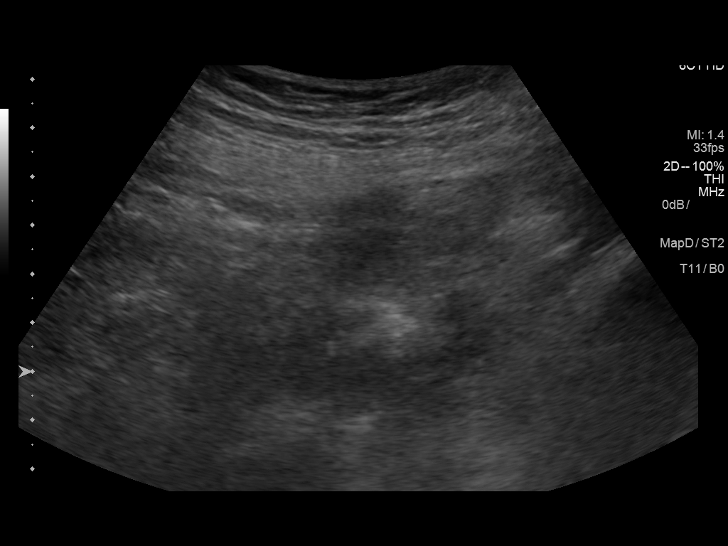
[im 73/82]
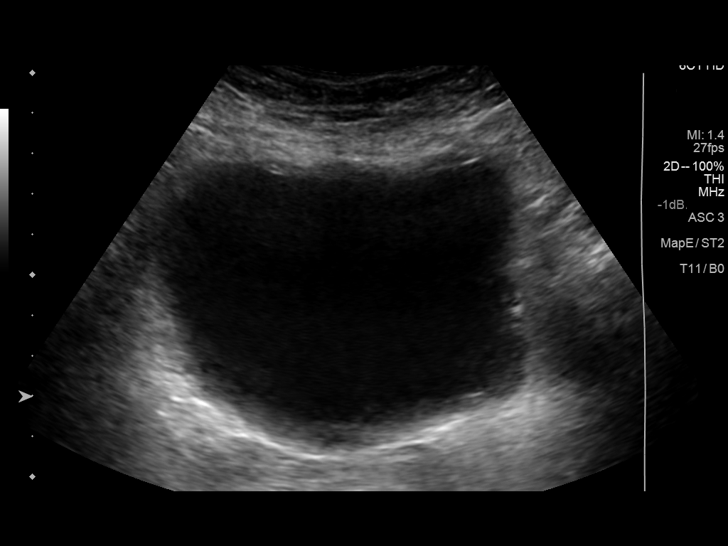
[im 82/82]
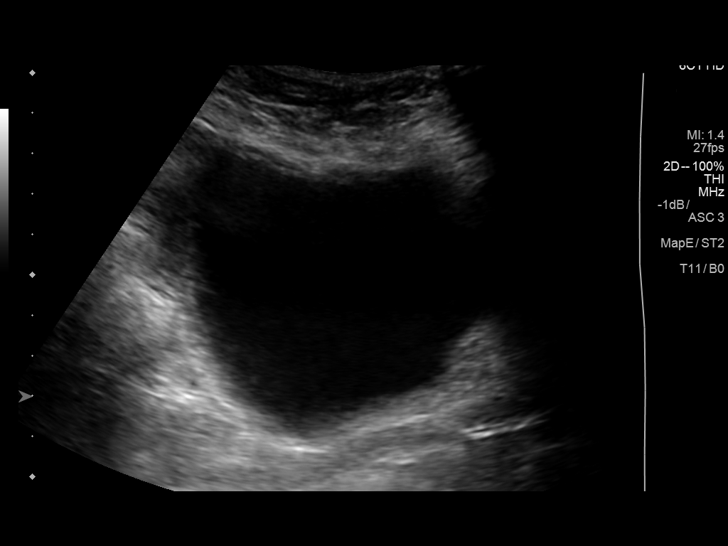

[Series 2001: us renal transplant · 0.15mm/px · 3 of 26 slices shown (2 of 2)]
[im 6/26]
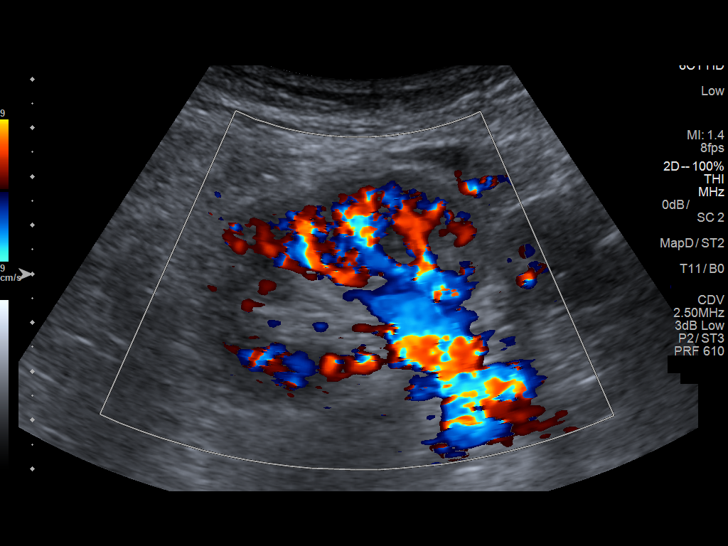
[im 16/26]
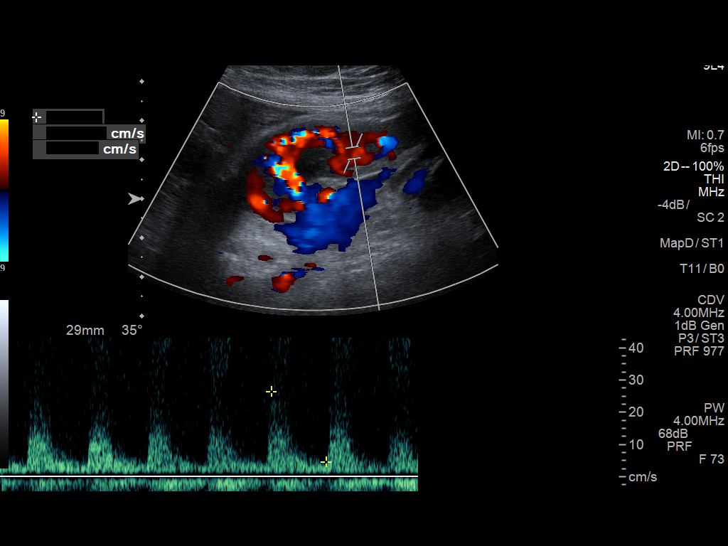
[im 26/26]
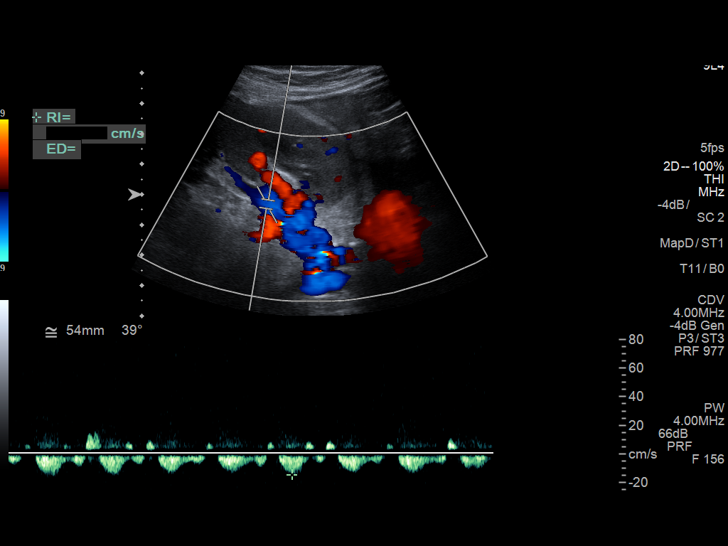

[13 of 25 positions shown; findings below may reference images not displayed]

FINDINGS: Transplant kidney location: Right iliac fossa

Transplant Kidney:

Length: 11.8 cm. Normal in size and parenchymal echogenicity. No
evidence of mass or hydronephrosis. No peri-transplant fluid
collection seen.

Color flow in the main renal artery:  Yes

Color flow in the main renal vein:  Yes

Duplex Doppler Evaluation:

Main Renal Artery Resistive Index:

Venous waveform in main renal vein:  Present.

Intrarenal resistive index in upper pole:

(normal 0.6-0.8; equivocal 0.8-0.9; abnormal >= 0.9)

Intrarenal resistive index in lower pole:

(normal 0.6-0.8; equivocal 0.8-0.9; abnormal >= 0.9)

Bladder: Normal for degree of bladder distention.

Other findings: No abnormal fluid collections surrounding the
transplanted kidney.
IMPRESSION: Unremarkable transplant kidney ultrasound demonstrating no evidence
of a transplant hydronephrosis, abnormal resistance indices or
surrounding fluid collections.

## 2018-03-06 IMAGING — DX DG ABDOMEN 2V
2 series · 2 of 2 positions shown · non-contrast
Comparison: CT 02/06/2017.

CLINICAL DATA: Scratched Small bowel obstruction.

EXAM:
ABDOMEN - 2 VIEW

[abdomen erect]
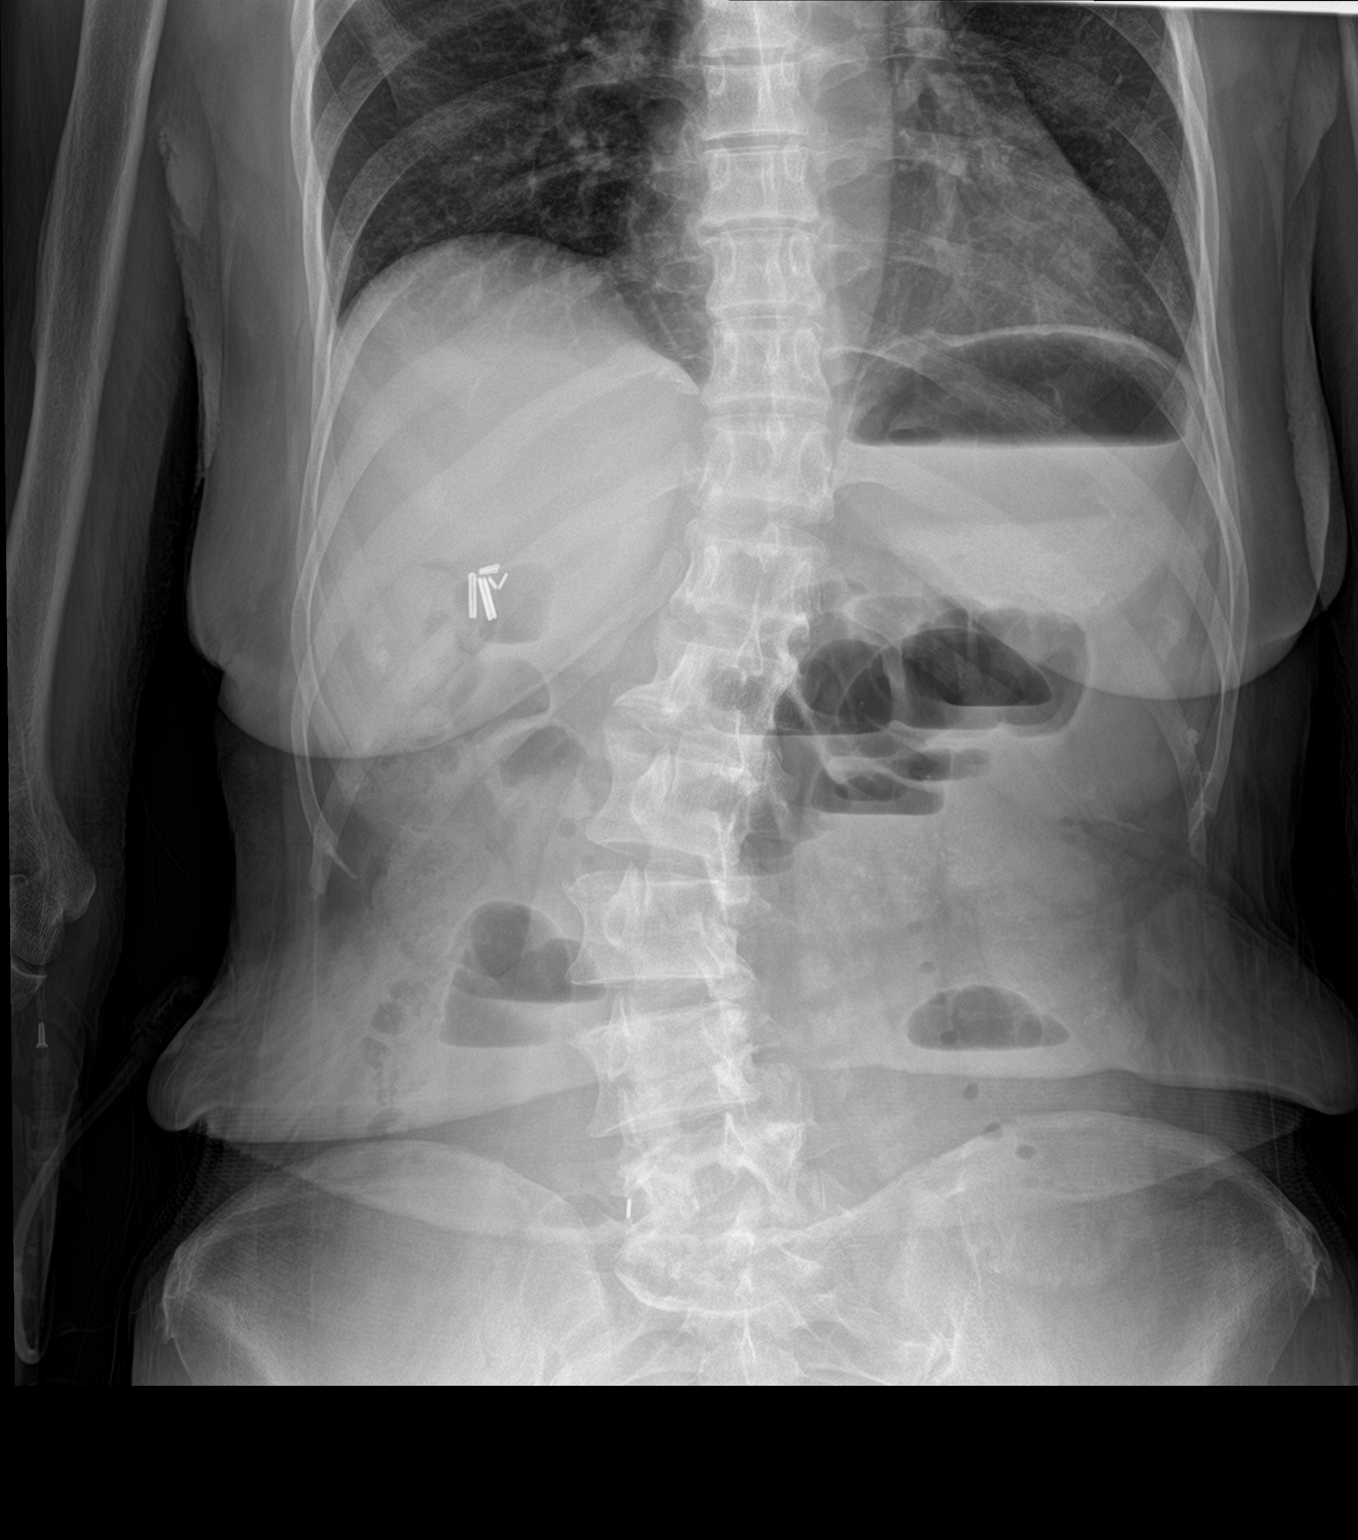

[abdomen supine]
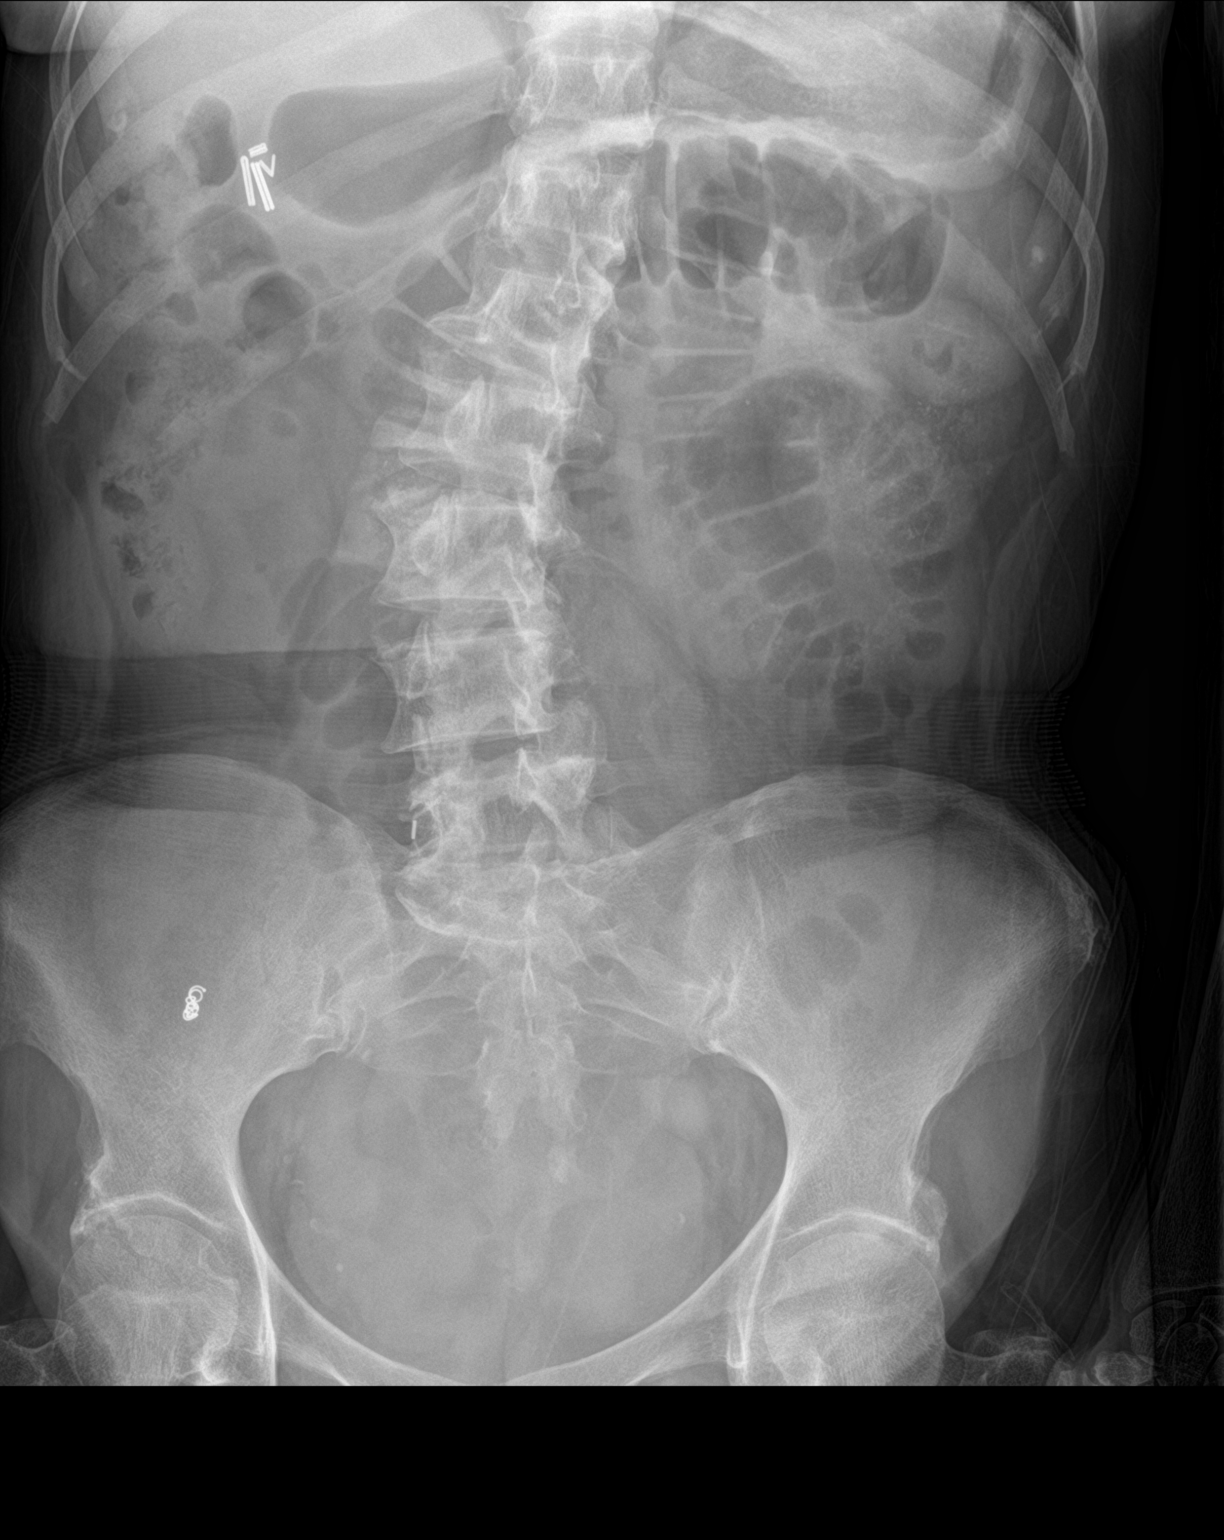

[2 of 2 positions shown; findings below may reference images not displayed]

FINDINGS: Surgical clips right upper quadrant. Multiple distended loops of
small bowel again noted. Bowel distention has improved slightly .
Findings consistent with persistent small-bowel obstruction with
possible slight improved. Stool and air noted within the colon. No
free air . Pelvic calcifications consistent phleboliths. Lumbar
spine scoliosis concave left again noted. Degenerative changes
lumbar spine and both hips.
IMPRESSION: Findings consistent with small bowel obstruction with possible
slight improvement in distention of small bowel. Stool and air noted
within the colon. No free air.

## 2020-03-09 ENCOUNTER — Other Ambulatory Visit: Payer: Self-pay | Admitting: Registered Nurse
